# Patient Record
Sex: Female | Born: 1960 | Race: White | Hispanic: No | Marital: Married | Smoking: Never smoker
Health system: Southern US, Community
[De-identification: ages and names within clinical notes are randomized; demographics above are authoritative.]

## PROBLEM LIST (undated history)

## (undated) DIAGNOSIS — B9681 Helicobacter pylori [H. pylori] as the cause of diseases classified elsewhere: Secondary | ICD-10-CM

## (undated) DIAGNOSIS — K219 Gastro-esophageal reflux disease without esophagitis: Secondary | ICD-10-CM

## (undated) DIAGNOSIS — D126 Benign neoplasm of colon, unspecified: Secondary | ICD-10-CM

## (undated) DIAGNOSIS — G709 Myoneural disorder, unspecified: Secondary | ICD-10-CM

## (undated) DIAGNOSIS — D869 Sarcoidosis, unspecified: Secondary | ICD-10-CM

## (undated) DIAGNOSIS — K2970 Gastritis, unspecified, without bleeding: Secondary | ICD-10-CM

## (undated) HISTORY — DX: Gastro-esophageal reflux disease without esophagitis: K21.9

## (undated) HISTORY — PX: NECK SURGERY: SHX720

## (undated) HISTORY — DX: Myoneural disorder, unspecified: G70.9

## (undated) HISTORY — DX: Sarcoidosis, unspecified: D86.9

## (undated) HISTORY — PX: TUBAL LIGATION: SHX77

## (undated) HISTORY — DX: Benign neoplasm of colon, unspecified: D12.6

## (undated) HISTORY — DX: Helicobacter pylori (H. pylori) as the cause of diseases classified elsewhere: B96.81

## (undated) HISTORY — PX: APPENDECTOMY: SHX54

## (undated) HISTORY — DX: Gastritis, unspecified, without bleeding: K29.70

---

## 2012-03-31 ENCOUNTER — Other Ambulatory Visit (HOSPITAL_COMMUNITY): Payer: Self-pay | Admitting: Pulmonary Disease

## 2012-03-31 DIAGNOSIS — D869 Sarcoidosis, unspecified: Secondary | ICD-10-CM

## 2012-04-11 ENCOUNTER — Ambulatory Visit (HOSPITAL_COMMUNITY)
Admission: RE | Admit: 2012-04-11 | Discharge: 2012-04-11 | Disposition: A | Discharge status: Home / Self Care | Payer: PRIVATE HEALTH INSURANCE | Source: Ambulatory Visit | Attending: Pulmonary Disease | Admitting: Pulmonary Disease

## 2012-04-11 DIAGNOSIS — D869 Sarcoidosis, unspecified: Secondary | ICD-10-CM | POA: Insufficient documentation

## 2012-04-11 DIAGNOSIS — R0602 Shortness of breath: Secondary | ICD-10-CM | POA: Insufficient documentation

## 2012-04-12 NOTE — Procedures (Signed)
NAME:  Vigeant, [facility]           ACCOUNT NO.:  [id number]  MEDICAL RECORD NO.:  [id number]  LOCATION:                                 FACILITY:  PHYSICIAN:  Edward L. Juanetta Gosling, M.D.DATE OF BIRTH:  1960-02-05  DATE OF PROCEDURE:  04/11/2012 DATE OF DISCHARGE:                           PULMONARY FUNCTION TEST   REASON FOR PULMONARY FUNCTION TESTING:  Sarcoidosis.  1. Spirometry is normal. 2. Lung volumes are normal. 3. DLCO is mildly reduced. 4. Airway resistance is minimally elevated. 5. This study is consistent with a clinical diagnosis of sarcoidosis.     Edward L. Juanetta Gosling, M.D.     ELH/MEDQ  D:  04/11/2012  T:  04/12/2012  Job:  [id number]

## 2012-04-26 LAB — PULMONARY FUNCTION TEST

## 2012-05-30 ENCOUNTER — Ambulatory Visit (HOSPITAL_COMMUNITY)
Admission: RE | Admit: 2012-05-30 | Discharge: 2012-05-30 | Disposition: A | Discharge status: Home / Self Care | Payer: PRIVATE HEALTH INSURANCE | Source: Ambulatory Visit | Attending: Pulmonary Disease | Admitting: Pulmonary Disease

## 2012-05-30 DIAGNOSIS — IMO0001 Reserved for inherently not codable concepts without codable children: Secondary | ICD-10-CM | POA: Insufficient documentation

## 2012-05-30 DIAGNOSIS — R0602 Shortness of breath: Secondary | ICD-10-CM | POA: Insufficient documentation

## 2012-05-30 DIAGNOSIS — R5383 Other fatigue: Secondary | ICD-10-CM | POA: Insufficient documentation

## 2012-05-30 DIAGNOSIS — D869 Sarcoidosis, unspecified: Secondary | ICD-10-CM | POA: Insufficient documentation

## 2012-05-30 DIAGNOSIS — R5381 Other malaise: Secondary | ICD-10-CM | POA: Insufficient documentation

## 2012-06-01 ENCOUNTER — Other Ambulatory Visit (HOSPITAL_COMMUNITY): Payer: Self-pay | Admitting: Pulmonary Disease

## 2012-06-01 DIAGNOSIS — R1011 Right upper quadrant pain: Secondary | ICD-10-CM

## 2012-06-05 ENCOUNTER — Ambulatory Visit (HOSPITAL_COMMUNITY)
Admission: RE | Admit: 2012-06-05 | Discharge: 2012-06-05 | Disposition: A | Discharge status: Home / Self Care | Source: Ambulatory Visit | Attending: Pulmonary Disease | Admitting: Pulmonary Disease

## 2012-06-05 DIAGNOSIS — R1011 Right upper quadrant pain: Secondary | ICD-10-CM | POA: Insufficient documentation

## 2012-06-05 DIAGNOSIS — R935 Abnormal findings on diagnostic imaging of other abdominal regions, including retroperitoneum: Secondary | ICD-10-CM | POA: Insufficient documentation

## 2012-06-09 ENCOUNTER — Other Ambulatory Visit (HOSPITAL_COMMUNITY): Payer: Self-pay | Admitting: Pulmonary Disease

## 2012-06-09 DIAGNOSIS — R109 Unspecified abdominal pain: Secondary | ICD-10-CM

## 2012-06-13 ENCOUNTER — Ambulatory Visit (HOSPITAL_COMMUNITY)
Admission: RE | Admit: 2012-06-13 | Discharge: 2012-06-13 | Disposition: A | Discharge status: Home / Self Care | Source: Ambulatory Visit | Attending: Pulmonary Disease | Admitting: Pulmonary Disease

## 2012-06-13 ENCOUNTER — Encounter (HOSPITAL_COMMUNITY): Payer: Self-pay

## 2012-06-13 DIAGNOSIS — R932 Abnormal findings on diagnostic imaging of liver and biliary tract: Secondary | ICD-10-CM | POA: Insufficient documentation

## 2012-06-13 DIAGNOSIS — R109 Unspecified abdominal pain: Secondary | ICD-10-CM

## 2012-06-13 DIAGNOSIS — R1011 Right upper quadrant pain: Secondary | ICD-10-CM | POA: Insufficient documentation

## 2012-06-13 MED ORDER — TECHNETIUM TC 99M MEBROFENIN IV KIT
5.0000 | PACK | Freq: Once | INTRAVENOUS | Status: AC | PRN
  Administered 2012-06-13: 5 via INTRAVENOUS

## 2012-06-13 MED ORDER — SINCALIDE 5 MCG IJ SOLR
0.0200 ug/kg | Freq: Once | INTRAMUSCULAR | Status: AC
  Administered 2012-06-13: 1.25 ug via INTRAVENOUS

## 2012-11-07 DIAGNOSIS — Z8739 Personal history of other diseases of the musculoskeletal system and connective tissue: Secondary | ICD-10-CM | POA: Insufficient documentation

## 2012-11-07 DIAGNOSIS — R112 Nausea with vomiting, unspecified: Secondary | ICD-10-CM

## 2012-11-07 DIAGNOSIS — Z9889 Other specified postprocedural states: Secondary | ICD-10-CM

## 2012-11-07 DIAGNOSIS — K828 Other specified diseases of gallbladder: Secondary | ICD-10-CM | POA: Insufficient documentation

## 2012-11-07 HISTORY — DX: Other specified postprocedural states: Z98.890

## 2012-11-07 HISTORY — DX: Nausea with vomiting, unspecified: R11.2

## 2012-11-27 ENCOUNTER — Other Ambulatory Visit (HOSPITAL_COMMUNITY): Payer: Self-pay

## 2012-11-27 DIAGNOSIS — D869 Sarcoidosis, unspecified: Secondary | ICD-10-CM

## 2012-12-02 HISTORY — PX: CHOLECYSTECTOMY: SHX55

## 2012-12-04 ENCOUNTER — Ambulatory Visit (HOSPITAL_COMMUNITY)
Admission: RE | Admit: 2012-12-04 | Discharge: 2012-12-04 | Disposition: A | Discharge status: Home / Self Care | Source: Ambulatory Visit | Attending: Pulmonary Disease | Admitting: Pulmonary Disease

## 2012-12-04 ENCOUNTER — Other Ambulatory Visit (HOSPITAL_COMMUNITY): Payer: Self-pay | Admitting: Pulmonary Disease

## 2012-12-04 DIAGNOSIS — D869 Sarcoidosis, unspecified: Secondary | ICD-10-CM | POA: Insufficient documentation

## 2012-12-04 MED ORDER — ALBUTEROL SULFATE (5 MG/ML) 0.5% IN NEBU
2.5000 mg | INHALATION_SOLUTION | Freq: Once | RESPIRATORY_TRACT | Status: AC
  Administered 2012-12-04: 2.5 mg via RESPIRATORY_TRACT

## 2012-12-07 NOTE — Procedures (Signed)
NAME:  [place], [facility]           ACCOUNT NO.:  [id number]  MEDICAL RECORD NO.:  [id number]  LOCATION:                                 FACILITY:  PHYSICIAN:  Harsh Trulock L. Juanetta Gosling, M.D.DATE OF BIRTH:  01-26-1961  DATE OF PROCEDURE:  12/05/2012 DATE OF DISCHARGE:                           PULMONARY FUNCTION TEST   Reason for pulmonary function testing is sarcoidosis. 1. Spirometry shows no ventilatory defect and no evidence of airflow     obstruction. 2. Lung volumes are normal. 3. DLCO is mildly reduced. 4. Airway resistance is normal. 5. There is no significant bronchodilator improvement. 6. This study is consistent with the clinical diagnosis of     sarcoidosis.     Tinleigh Whitmire L. Juanetta Gosling, M.D.     ELH/MEDQ  D:  12/05/2012  T:  12/06/2012  Job:  [id number]

## 2012-12-12 LAB — PULMONARY FUNCTION TEST

## 2013-02-21 DIAGNOSIS — L730 Acne keloid: Secondary | ICD-10-CM | POA: Insufficient documentation

## 2013-07-19 ENCOUNTER — Encounter: Payer: Self-pay | Admitting: *Deleted

## 2013-08-01 DIAGNOSIS — D126 Benign neoplasm of colon, unspecified: Secondary | ICD-10-CM

## 2013-08-01 DIAGNOSIS — B9681 Helicobacter pylori [H. pylori] as the cause of diseases classified elsewhere: Secondary | ICD-10-CM

## 2013-08-01 HISTORY — DX: Benign neoplasm of colon, unspecified: D12.6

## 2013-08-01 HISTORY — DX: Helicobacter pylori (H. pylori) as the cause of diseases classified elsewhere: B96.81

## 2013-08-23 ENCOUNTER — Encounter (INDEPENDENT_AMBULATORY_CARE_PROVIDER_SITE_OTHER): Payer: Self-pay

## 2013-08-23 ENCOUNTER — Other Ambulatory Visit: Payer: Self-pay | Admitting: Gastroenterology

## 2013-08-23 ENCOUNTER — Encounter: Payer: Self-pay | Admitting: Gastroenterology

## 2013-08-23 ENCOUNTER — Ambulatory Visit (INDEPENDENT_AMBULATORY_CARE_PROVIDER_SITE_OTHER): Admitting: Gastroenterology

## 2013-08-23 VITALS — BP 115/73 | HR 76 | Temp 97.4°F | Ht 61.0 in | Wt 148.8 lb

## 2013-08-23 DIAGNOSIS — K5900 Constipation, unspecified: Secondary | ICD-10-CM

## 2013-08-23 DIAGNOSIS — R1013 Epigastric pain: Secondary | ICD-10-CM

## 2013-08-23 DIAGNOSIS — K3189 Other diseases of stomach and duodenum: Secondary | ICD-10-CM

## 2013-08-23 DIAGNOSIS — K5901 Slow transit constipation: Secondary | ICD-10-CM | POA: Insufficient documentation

## 2013-08-23 HISTORY — DX: Slow transit constipation: K59.01

## 2013-08-23 MED ORDER — OMEPRAZOLE 20 MG PO CPDR: 20.0000 mg | DELAYED_RELEASE_CAPSULE | Freq: Every day | ORAL | Status: DC

## 2013-08-23 MED ORDER — PEG 3350-KCL-NA BICARB-NACL 420 G PO SOLR: 4000.0000 mL | ORAL | Status: DC

## 2013-08-23 MED ORDER — LINACLOTIDE 145 MCG PO CAPS: 145.0000 ug | ORAL_CAPSULE | Freq: Every day | ORAL | Status: DC

## 2013-08-23 NOTE — Progress Notes (Signed)
Referring Provider: Alonza Bogus, MD Primary Care Physician:  Alonza Bogus, MD Primary Gastroenterologist:  Dr. Oneida Alar   Chief Complaint  Patient presents with  . Abdominal Pain  . Bloated    HPI:   Terri Newman presents today at the request of Dr. Luan Pulling secondary to abdominal pain and bloating. Last colonoscopy age 53. Cholecystectomy last Nov 2014 at [facility]. Notes upper abdominal discomfort, bloating. Burps a lot, stays constipated. Hiccups after eating. Epigastric burning, sometimes stabbing. Sometimes related to eating, sometimes not. Associated nausea. Occasional reflux. Takes Tums occasionally. No dysphagia. BM usually once a day, sometimes every other day. Hard and soft stool. Straining sometimes. Occasional low-volume hematochezia with straining. No melena. No weight loss, lack of appetite. No NSAIDs or aspirin powders. No prior EGD.   Past Medical History  Diagnosis Date  . Sarcoidosis     Past Surgical History  Procedure Laterality Date  . Tubal ligation    . Appendectomy    . Cholecystectomy  Nov 2014  . Neck surgery      Current Outpatient Prescriptions  Medication Sig Dispense Refill  . acetaminophen (TYLENOL) 500 MG tablet Take 500 mg by mouth every 6 (six) hours as needed.       No current facility-administered medications for this visit.    Allergies as of 08/23/2013  . (No Known Allergies)    Family History  Problem Relation Age of Onset  . Colon cancer Neg Hx     History   Social History  . Marital Status: Married    Spouse Name: N/A    Number of Children: N/A  . Years of Education: N/A   Occupational History  . caregiver    Social History Main Topics  . Smoking status: Never Smoker   . Smokeless tobacco: Not on file  . Alcohol Use: No  . Drug Use: No  . Sexual Activity: Not on file   Other Topics Concern  . Not on file   Social History Narrative  . No narrative on file    Review of Systems: Gen: Denies any  fever, chills, loss of appetite, fatigue, weight loss. CV: Denies chest pain, heart palpitations, syncope, peripheral edema. Resp: Denies shortness of breath with rest, cough, wheezing GI: see HPI  GU : Denies urinary burning, urinary frequency, urinary incontinence.  MS: back pain Derm: Denies rash, itching, dry skin Psych: Denies depression, anxiety, confusion or memory loss  Heme: Denies bruising, bleeding, and enlarged lymph nodes.  Physical Exam: BP 115/73  Pulse 76  Temp(Src) 97.4 F (36.3 C) (Oral)  Ht 5\' 1"  (1.549 m)  Wt 148 lb 12.8 oz (67.495 kg)  BMI 28.13 kg/m2 General:   Alert and oriented. Well-developed, well-nourished, pleasant and cooperative. Head:  Normocephalic and atraumatic. Eyes:  Conjunctiva pink, sclera clear, no icterus.   Conjunctiva pink. Ears:  Normal auditory acuity. Nose:  No deformity, discharge,  or lesions. Mouth:  No deformity or lesions, mucosa pink and moist.  Lungs:  Clear to auscultation bilaterally, without wheezing, rales, or rhonchi.  Heart:  S1, S2 present without murmurs noted.  Abdomen:  +BS, soft, mild discomfort with palpation upper abdomen and non-distended. Without mass or HSM. No rebound or guarding. No hernias noted. Rectal:  Deferred  Msk:  Symmetrical without gross deformities. Normal posture. Extremities:  Without clubbing or edema. Neurologic:  Alert and  oriented x4;  grossly normal neurologically. Skin:  Intact, warm and dry without significant lesions or rashes Psych:  Alert  and cooperative. Normal mood and affect.

## 2013-08-23 NOTE — Assessment & Plan Note (Addendum)
53 year old female with constipation, bloating, low-volume hematochezia likely benign in setting of straining. Last colonoscopy age 60. Needs routine screening at this time. Likely bloating and possibly even upper abdominal discomfort stemming from constipation. See dyspepsia.  Proceed with colonoscopy with Dr. Oneida Alar in the near future. The risks, benefits, and alternatives have been discussed in detail with the patient. They state understanding and desire to proceed.  Linzess 145 mcg daily Probiotic daily

## 2013-08-23 NOTE — Assessment & Plan Note (Signed)
Epigastric burning, nausea, and belching. No PPI. No NSAIDs or aspirin powders; she is without signs of melena or other concerning features. Start on Prilosec once daily. If no improvement with Prilosec and more aggressive constipation regimen, proceed with EGD at time of colonoscopy with Dr. Oneida Alar. Risks and benefits discussed in detail with patient and husband.

## 2013-08-23 NOTE — Patient Instructions (Signed)
We have scheduled you for a colonoscopy and possible upper endoscopy with Dr. Oneida Alar in the near future.  Start taking Prilosec one capsule each morning, 30 minutes before breakfast. This has been sent to your pharmacy.  For constipation: start Linzess 1 capsule each morning, 30 minutes before breakfast. A month supply voucher has been provided with refills at the pharmacy.  Start taking a probiotic daily such as digestive advantage, [employer]'s [employer] Health, [employer] brand, Align, or restora.

## 2013-08-24 ENCOUNTER — Encounter (HOSPITAL_COMMUNITY): Payer: Self-pay | Admitting: Pharmacy Technician

## 2013-08-24 ENCOUNTER — Ambulatory Visit: Admitting: Gastroenterology

## 2013-08-28 NOTE — Progress Notes (Signed)
cc'd to pcp 

## 2013-08-29 ENCOUNTER — Encounter (HOSPITAL_COMMUNITY)
Admission: RE | Disposition: A | Discharge status: Home / Self Care | Payer: Self-pay | Source: Ambulatory Visit | Attending: Gastroenterology

## 2013-08-29 ENCOUNTER — Ambulatory Visit (HOSPITAL_COMMUNITY)
Admission: RE | Admit: 2013-08-29 | Discharge: 2013-08-29 | Disposition: A | Discharge status: Home / Self Care | Payer: TRICARE For Life (TFL) | Source: Ambulatory Visit | Attending: Gastroenterology | Admitting: Gastroenterology

## 2013-08-29 ENCOUNTER — Encounter (HOSPITAL_COMMUNITY): Payer: Self-pay | Admitting: *Deleted

## 2013-08-29 DIAGNOSIS — K2940 Chronic atrophic gastritis without bleeding: Secondary | ICD-10-CM | POA: Insufficient documentation

## 2013-08-29 DIAGNOSIS — K620 Anal polyp: Secondary | ICD-10-CM | POA: Insufficient documentation

## 2013-08-29 DIAGNOSIS — K5900 Constipation, unspecified: Secondary | ICD-10-CM

## 2013-08-29 DIAGNOSIS — Z1211 Encounter for screening for malignant neoplasm of colon: Secondary | ICD-10-CM | POA: Insufficient documentation

## 2013-08-29 DIAGNOSIS — K3189 Other diseases of stomach and duodenum: Secondary | ICD-10-CM | POA: Insufficient documentation

## 2013-08-29 DIAGNOSIS — Q438 Other specified congenital malformations of intestine: Secondary | ICD-10-CM | POA: Insufficient documentation

## 2013-08-29 DIAGNOSIS — Z79899 Other long term (current) drug therapy: Secondary | ICD-10-CM | POA: Insufficient documentation

## 2013-08-29 DIAGNOSIS — A048 Other specified bacterial intestinal infections: Secondary | ICD-10-CM | POA: Insufficient documentation

## 2013-08-29 DIAGNOSIS — D126 Benign neoplasm of colon, unspecified: Secondary | ICD-10-CM

## 2013-08-29 DIAGNOSIS — R1013 Epigastric pain: Secondary | ICD-10-CM | POA: Insufficient documentation

## 2013-08-29 DIAGNOSIS — K648 Other hemorrhoids: Secondary | ICD-10-CM | POA: Insufficient documentation

## 2013-08-29 DIAGNOSIS — K621 Rectal polyp: Secondary | ICD-10-CM

## 2013-08-29 HISTORY — PX: COLONOSCOPY: SHX5424

## 2013-08-29 HISTORY — PX: ESOPHAGOGASTRODUODENOSCOPY: SHX5428

## 2013-08-29 SURGERY — COLONOSCOPY
Anesthesia: Moderate Sedation

## 2013-08-29 MED ORDER — LIDOCAINE VISCOUS 2 % MT SOLN
OROMUCOSAL | Status: AC
  Filled 2013-08-29: qty 15

## 2013-08-29 MED ORDER — LIDOCAINE VISCOUS 2 % MT SOLN
OROMUCOSAL | Status: DC | PRN
  Administered 2013-08-29: 1 via OROMUCOSAL

## 2013-08-29 MED ORDER — MEPERIDINE HCL 100 MG/ML IJ SOLN
INTRAMUSCULAR | Status: DC | PRN
  Administered 2013-08-29 (×3): 25 mg via INTRAVENOUS

## 2013-08-29 MED ORDER — MIDAZOLAM HCL 5 MG/5ML IJ SOLN
INTRAMUSCULAR | Status: AC
  Filled 2013-08-29: qty 10

## 2013-08-29 MED ORDER — PROMETHAZINE HCL 25 MG/ML IJ SOLN
INTRAMUSCULAR | Status: DC | PRN
  Administered 2013-08-29: 6.25 mg via INTRAVENOUS

## 2013-08-29 MED ORDER — MEPERIDINE HCL 100 MG/ML IJ SOLN
INTRAMUSCULAR | Status: AC
  Filled 2013-08-29: qty 2

## 2013-08-29 MED ORDER — SODIUM CHLORIDE 0.9 % IV SOLN
INTRAVENOUS | Status: DC
Start: 2013-08-29 — End: 2013-08-29
  Administered 2013-08-29: 10:00:00 via INTRAVENOUS

## 2013-08-29 MED ORDER — MIDAZOLAM HCL 5 MG/5ML IJ SOLN
INTRAMUSCULAR | Status: DC | PRN
  Administered 2013-08-29 (×2): 1 mg via INTRAVENOUS
  Administered 2013-08-29: 2 mg via INTRAVENOUS
  Administered 2013-08-29: 1 mg via INTRAVENOUS

## 2013-08-29 MED ORDER — PROMETHAZINE HCL 25 MG/ML IJ SOLN
INTRAMUSCULAR | Status: DC
Start: 2013-08-29 — End: 2013-08-29
  Filled 2013-08-29: qty 1

## 2013-08-29 MED ORDER — STERILE WATER FOR IRRIGATION IR SOLN
Status: DC | PRN
  Administered 2013-08-29: 10:00:00

## 2013-08-29 MED ORDER — SODIUM CHLORIDE 0.9 % IJ SOLN
INTRAMUSCULAR | Status: AC
  Filled 2013-08-29: qty 10

## 2013-08-29 NOTE — Discharge Instructions (Signed)
You have internal hemorrhoids, and HAD 4 polyps removed. You have mild gastritis.I biopsied your stomach.   DRINK WATER TO KEEP YOUR URINE LIGHT YELLOW.  CONTINUE OMEPRAZOLE & LINZESS.  AVOID ITEMS THAT TRIGGER GASTRITIS. SEE INFO BELOW.  FOLLOW A HIGH FIBER/LOW FAT DIET. AVOID ITEMS THAT CAUSE BLOATING. SEE INFO BELOW.  Next colonoscopy in 5-10 years.    ENDOSCOPY Care After Read the instructions outlined below and refer to this sheet in the next week. These discharge instructions provide you with general information on caring for yourself after you leave the hospital. While your treatment has been planned according to the most current medical practices available, unavoidable complications occasionally occur. If you have any problems or questions after discharge, call DR. Betzabe Bevans, [phone].  ACTIVITY  You may resume your regular activity, but move at a slower pace for the next 24 hours.   Take frequent rest periods for the next 24 hours.   Walking will help get rid of the air and reduce the bloated feeling in your belly (abdomen).   No driving for 24 hours (because of the medicine (anesthesia) used during the test).   You may shower.   Do not sign any important legal documents or operate any machinery for 24 hours (because of the anesthesia used during the test).    NUTRITION  Drink plenty of fluids.   You may resume your normal diet as instructed by your doctor.   Begin with a light meal and progress to your normal diet. Heavy or fried foods are harder to digest and may make you feel sick to your stomach (nauseated).   Avoid alcoholic beverages for 24 hours or as instructed.    MEDICATIONS  You may resume your normal medications.   WHAT YOU CAN EXPECT TODAY  Some feelings of bloating in the abdomen.   Passage of more gas than usual.   Spotting of blood in your stool or on the toilet paper  .  IF YOU HAD POLYPS REMOVED DURING THE ENDOSCOPY:  Eat a soft  diet IF YOU HAVE NAUSEA, BLOATING, ABDOMINAL PAIN, OR VOMITING.    FINDING OUT THE RESULTS OF YOUR TEST Not all test results are available during your visit. DR. Oneida Alar WILL CALL YOU WITHIN 7 DAYS OF YOUR PROCEDUE WITH YOUR RESULTS. Do not assume everything is normal if you have not heard from DR. Zacory Fiola IN ONE WEEK, CALL HER OFFICE AT [phone].  SEEK IMMEDIATE MEDICAL ATTENTION AND CALL THE OFFICE: [phone] IF:  You have more than a spotting of blood in your stool.   Your belly is swollen (abdominal distention).   You are nauseated or vomiting.   You have a temperature over 101F.   You have abdominal pain or discomfort that is severe or gets worse throughout the day.  Polyps, Colon  A polyp is extra tissue that grows inside your body. Colon polyps grow in the large intestine. The large intestine, also called the colon, is part of your digestive system. It is a long, hollow tube at the end of your digestive tract where your body makes and stores stool. Most polyps are not dangerous. They are benign. This means they are not cancerous. But over time, some types of polyps can turn into cancer. Polyps that are smaller than a pea are usually not harmful. But larger polyps could someday become or may already be cancerous. To be safe, doctors remove all polyps and test them.   WHO GETS POLYPS? Anyone can get polyps, but  certain people are more likely than others. You may have a greater chance of getting polyps if:  You are over 50.   You have had polyps before.   Someone in your family has had polyps.   Someone in your family has had cancer of the large intestine.   Find out if someone in your family has had polyps. You may also be more likely to get polyps if you:   Eat a lot of fatty foods   Smoke   Drink alcohol   Do not exercise  Eat too much   PREVENTION There is not one sure way to prevent polyps. You might be able to lower your risk of getting them if you:  Eat  more fruits and vegetables and less fatty food.   Do not smoke.   Avoid alcohol.   Exercise every day.   Lose weight if you are overweight.   Eating more calcium and folate can also lower your risk of getting polyps. Some foods that are rich in calcium are milk, cheese, and broccoli. Some foods that are rich in folate are chickpeas, kidney beans, and spinach.    Gastritis  Gastritis is an inflammation (the body's way of reacting to injury and/or infection) of the stomach. It is often caused by viral or bacterial (germ) infections. It can also be caused BY ASPIRIN, BC/GOODY POWDER'S, (IBUPROFEN) MOTRIN, OR ALEVE (NAPROXEN), chemicals (including alcohol), SPICY FOODS, and medications. This illness may be associated with generalized malaise (feeling tired, not well), UPPER ABDOMINAL STOMACH cramps, and fever. One common bacterial cause of gastritis is an organism known as H. Pylori. This can be treated with antibiotics.    High-Fiber Diet A high-fiber diet changes your normal diet to include more whole grains, legumes, fruits, and vegetables. Changes in the diet involve replacing refined carbohydrates with unrefined foods. The calorie level of the diet is essentially unchanged. The Dietary Reference Intake (recommended amount) for adult males is 38 grams per day. For adult females, it is 25 grams per day. Pregnant and lactating women should consume 28 grams of fiber per day. Fiber is the intact part of a plant that is not broken down during digestion. Functional fiber is fiber that has been isolated from the plant to provide a beneficial effect in the body. PURPOSE  Increase stool bulk.   Ease and regulate bowel movements.   Lower cholesterol.  INDICATIONS THAT YOU NEED MORE FIBER  Constipation and hemorrhoids.   Uncomplicated diverticulosis (intestine condition) and irritable bowel syndrome.   Weight management.   As a protective measure against hardening of the arteries  (atherosclerosis), diabetes, and cancer.   GUIDELINES FOR INCREASING FIBER IN THE DIET  Start adding fiber to the diet slowly. A gradual increase of about 5 more grams (2 slices of whole-wheat bread, 2 servings of most fruits or vegetables, or 1 bowl of high-fiber cereal) per day is best. Too rapid an increase in fiber may result in constipation, flatulence, and bloating.   Drink enough water and fluids to keep your urine clear or pale yellow. Water, juice, or caffeine-free drinks are recommended. Not drinking enough fluid may cause constipation.   Eat a variety of high-fiber foods rather than one type of fiber.   Try to increase your intake of fiber through using high-fiber foods rather than fiber pills or supplements that contain small amounts of fiber.   The goal is to change the types of food eaten. Do not supplement your present diet with high-fiber  foods, but replace foods in your present diet.  INCLUDE A VARIETY OF FIBER SOURCES  Replace refined and processed grains with whole grains, canned fruits with fresh fruits, and incorporate other fiber sources. White rice, white breads, and most bakery goods contain little or no fiber.   Brown whole-grain rice, buckwheat oats, and many fruits and vegetables are all good sources of fiber. These include: broccoli, Brussels sprouts, cabbage, cauliflower, beets, sweet potatoes, white potatoes (skin on), carrots, tomatoes, eggplant, squash, berries, fresh fruits, and dried fruits.   Cereals appear to be the richest source of fiber. Cereal fiber is found in whole grains and bran. Bran is the fiber-rich outer coat of cereal grain, which is largely removed in refining. In whole-grain cereals, the bran remains. In breakfast cereals, the largest amount of fiber is found in those with "bran" in their names. The fiber content is sometimes indicated on the label.   You may need to include additional fruits and vegetables each day.   In baking, for 1 cup  white flour, you may use the following substitutions:   1 cup whole-wheat flour minus 2 tablespoons.   1/2 cup white flour plus 1/2 cup whole-wheat flour.   Low-Fat Diet BREADS, CEREALS, PASTA, RICE, DRIED PEAS, AND BEANS These products are high in carbohydrates and most are low in fat. Therefore, they can be increased in the diet as substitutes for fatty foods. They too, however, contain calories and should not be eaten in excess. Cereals can be eaten for snacks as well as for breakfast.  Include foods that contain fiber (fruits, vegetables, whole grains, and legumes). Research shows that fiber may lower blood cholesterol levels, especially the water-soluble fiber found in fruits, vegetables, oat products, and legumes. FRUITS AND VEGETABLES It is good to eat fruits and vegetables. Besides being sources of fiber, both are rich in vitamins and some minerals. They help you get the daily allowances of these nutrients. Fruits and vegetables can be used for snacks and desserts. MEATS Limit lean meat, chicken, [place], and fish to no more than 6 ounces per day. Beef, Pork, and Lamb Use lean cuts of beef, pork, and lamb. Lean cuts include:  Extra-lean ground beef.  Arm roast.  Sirloin tip.  Center-cut ham.  Round steak.  Loin chops.  Rump roast.  Tenderloin.  Trim all fat off the outside of meats before cooking. It is not necessary to severely decrease the intake of red meat, but lean choices should be made. Lean meat is rich in protein and contains a highly absorbable form of iron. Premenopausal women, in particular, should avoid reducing lean red meat because this could increase the risk for low red blood cells (iron-deficiency anemia). The organ meats, such as liver, sweetbreads, kidneys, and brain are very rich in cholesterol. They should be limited. Chicken and [place] These are good sources of protein. The fat of poultry can be reduced by removing the skin and underlying fat layers before  cooking. Chicken and [place] can be substituted for lean red meat in the diet. Poultry should not be fried or covered with high-fat sauces. Fish and Shellfish Fish is a good source of protein. Shellfish contain cholesterol, but they usually are low in saturated fatty acids. The preparation of fish is important. Like chicken and [place], they should not be fried or covered with high-fat sauces. EGGS Egg whites contain no fat or cholesterol. They can be eaten often. Try 1 to 2 egg whites instead of whole eggs in recipes or  use egg substitutes that do not contain yolk. MILK AND DAIRY PRODUCTS Use skim or 1% milk instead of 2% or whole milk. Decrease whole milk, natural, and processed cheeses. Use nonfat or low-fat (2%) cottage cheese or low-fat cheeses made from vegetable oils. Choose nonfat or low-fat (1 to 2%) yogurt. Experiment with evaporated skim milk in recipes that call for heavy cream. Substitute low-fat yogurt or low-fat cottage cheese for sour cream in dips and salad dressings. Have at least 2 servings of low-fat dairy products, such as 2 glasses of skim (or 1%) milk each day to help get your daily calcium intake.  FATS AND OILS Reduce the total intake of fats, especially saturated fat. Butterfat, lard, and beef fats are high in saturated fat and cholesterol. These should be avoided as much as possible. Vegetable fats do not contain cholesterol, but certain vegetable fats, such as coconut oil, palm oil, and palm kernel oil are very high in saturated fats. These should be limited. These fats are often used in bakery goods, processed foods, popcorn, oils, and nondairy creamers. Vegetable shortenings and some peanut butters contain hydrogenated oils, which are also saturated fats. Read the labels on these foods and check for saturated vegetable oils. Unsaturated vegetable oils and fats do not raise blood cholesterol. However, they should be limited because they are fats and are high in calories. Total  fat should still be limited to 30% of your daily caloric intake. Desirable liquid vegetable oils are corn oil, cottonseed oil, olive oil, canola oil, safflower oil, soybean oil, and sunflower oil. Peanut oil is not as good, but small amounts are acceptable. Buy a heart-healthy tub margarine that has no partially hydrogenated oils in the ingredients. Mayonnaise and salad dressings often are made from unsaturated fats, but they should also be limited because of their high calorie and fat content. Seeds, nuts, peanut butter, olives, and avocados are high in fat, but the fat is mainly the unsaturated type. These foods should be limited mainly to avoid excess calories and fat. OTHER EATING TIPS Snacks  Most sweets should be limited as snacks. They tend to be rich in calories and fats, and their caloric content outweighs their nutritional value. Some good choices in snacks are graham crackers, melba toast, soda crackers, bagels (no egg), English muffins, fruits, and vegetables. These snacks are preferable to snack crackers, [place] fries, and chips. Popcorn should be air-popped or cooked in small amounts of liquid vegetable oil. Desserts Eat fruit, low-fat yogurt, and fruit ices. AVOID pastries, cake, and cookies. Sherbet, angel food cake, gelatin dessert, frozen low-fat yogurt, or other frozen products that do not contain saturated fat (pure fruit juice bars, frozen ice pops) are also acceptable.  COOKING METHODS Choose those methods that use little or no fat. They include: Poaching.  Braising.  Steaming.  Grilling.  Baking.  Stir-frying.  Broiling.  Microwaving.  Foods can be cooked in a nonstick pan without added fat, or use a nonfat cooking spray in regular cookware. Limit fried foods and avoid frying in saturated fat. Add moisture to lean meats by using water, broth, cooking wines, and other nonfat or low-fat sauces along with the cooking methods mentioned above. Soups and stews should be chilled  after cooking. The fat that forms on top after a few hours in the refrigerator should be skimmed off. When preparing meals, avoid using excess salt. Salt can contribute to raising blood pressure in some people. EATING AWAY FROM HOME Order entres, potatoes, and vegetables without sauces  or butter. When meat exceeds the size of a deck of cards (3 to 4 ounces), the rest can be taken home for another meal. Choose vegetable or fruit salads and ask for low-calorie salad dressings to be served on the side. Use dressings sparingly. Limit high-fat toppings, such as bacon, crumbled eggs, cheese, sunflower seeds, and olives. Ask for heart-healthy tub margarine instead of butter.   Hemorrhoids Hemorrhoids are dilated (enlarged) veins around the rectum. Sometimes clots will form in the veins. This makes them swollen and painful. These are called thrombosed hemorrhoids. Causes of hemorrhoids include:  Constipation.   Straining to have a bowel movement.   HEAVY LIFTING HOME CARE INSTRUCTIONS  Eat a well balanced diet and drink 6 to 8 glasses of water every day to avoid constipation. You may also use a bulk laxative.   Avoid straining to have bowel movements.   Keep anal area dry and clean.   Do not use a donut shaped pillow or sit on the toilet for long periods. This increases blood pooling and pain.   Move your bowels when your body has the urge; this will require less straining and will decrease pain and pressure.

## 2013-08-29 NOTE — Progress Notes (Signed)
REVIEWED.  

## 2013-08-29 NOTE — H&P (Signed)
  Primary Care Physician:  Alonza Bogus, MD Primary Gastroenterologist:  Dr. Oneida Alar  Pre-Procedure History & Physical: HPI:  Terri Newman is a 53 y.o. female here for ABDOMINAL PAIN/screening  Past Medical History  Diagnosis Date  . Sarcoidosis     Past Surgical History  Procedure Laterality Date  . Tubal ligation    . Appendectomy    . Cholecystectomy  Nov 2014  . Neck surgery      Prior to Admission medications   Medication Sig Start Date End Date Taking? Authorizing Provider  acetaminophen (TYLENOL) 500 MG tablet Take 500 mg by mouth every 6 (six) hours as needed for mild pain.    Yes Historical Provider, MD  Linaclotide Rolan Lipa) 145 MCG CAPS capsule Take 1 capsule (145 mcg total) by mouth daily. 30 minutes before breakfast 08/23/13  Yes Orvil Feil, NP  omeprazole (PRILOSEC) 20 MG capsule Take 1 capsule (20 mg total) by mouth daily. 30 minutes before breakfast. 08/23/13  Yes Orvil Feil, NP  polyethylene glycol-electrolytes (TRILYTE) 420 G solution Take 4,000 mLs by mouth as directed. 08/23/13  Yes Danie Binder, MD  Probiotic Product (ALIGN) 4 MG CAPS Take 1 capsule by mouth daily.   Yes Historical Provider, MD    Allergies as of 08/23/2013  . (No Known Allergies)    Family History  Problem Relation Age of Onset  . Colon cancer Neg Hx     History   Social History  . Marital Status: Married    Spouse Name: N/A    Number of Children: N/A  . Years of Education: N/A   Occupational History  . caregiver    Social History Main Topics  . Smoking status: Never Smoker   . Smokeless tobacco: Not on file  . Alcohol Use: No  . Drug Use: No  . Sexual Activity: Not on file   Other Topics Concern  . Not on file   Social History Narrative  . No narrative on file    Review of Systems: See HPI, otherwise negative ROS   Physical Exam: BP 119/81  Pulse 77  Temp(Src) 97.7 F (36.5 C) (Oral)  Resp 20  Ht 5\' 1"  (1.549 m)  Wt 148 lb (67.132 kg)  BMI 27.98  kg/m2  SpO2 100% General:   Alert,  pleasant and cooperative in NAD Head:  Normocephalic and atraumatic. Neck:  Supple; Lungs:  Clear throughout to auscultation.    Heart:  Regular rate and rhythm. Abdomen:  Soft, nontender and nondistended. Normal bowel sounds, without guarding, and without rebound.   Neurologic:  Alert and  oriented x4;  grossly normal neurologically.  Impression/Plan:    SCREENING/dyspepsia  Plan:  1. TCS/EGD TODAY

## 2013-08-29 NOTE — Op Note (Addendum)
[facility] [address] [place], [address]   COLONOSCOPY PROCEDURE REPORT  PATIENT: Terri Newman, Terri Newman  MR#: [id number] BIRTHDATE: 07-30-1960 , 53  yrs. old GENDER: Female ENDOSCOPIST: Barney Drain, MD REFERRED QM:[id number] Luan Pulling, M.D. PROCEDURE DATE:  08/29/2013 PROCEDURE:   Colonoscopy with snare and cold biopsy polypectomy INDICATIONS:Average risk patient for colon cancer. MEDICATIONS: Demerol 50 mg IV and Versed 4 mg IV  DESCRIPTION OF PROCEDURE:    Physical exam was performed.  Informed consent was obtained from the patient after explaining the benefits, risks, and alternatives to procedure.  The patient was connected to monitor and placed in left lateral position. Continuous oxygen was provided by nasal cannula and IV medicine administered through an indwelling cannula.  After administration of sedation and rectal exam, the patients rectum was intubated and the EC-3890Li ([id number])  colonoscope was advanced under direct visualization to the ileum.  The scope was removed slowly by carefully examining the color, texture, anatomy, and integrity mucosa on the way out.  The patient was recovered in endoscopy and discharged home in satisfactory condition.    COLON FINDINGS: The mucosa appeared normal in the terminal ileum.  , Two sessile polyps measuring 6 mm in size were found in the sigmoid colon.  A polypectomy was performed using snare cautery.  , Two sessile polyps measuring 2-4 mm in size were found in the rectum. A polypectomy was performed with cold forceps.  , Moderate sized internal hemorrhoids were found.  , and The LEFT colon IS SLIGHTLY redundant.  Manual abdominal counter-pressure was used to reach the cecum.  PREP QUALITY: good.  CECAL W/D TIME: 18 minutes     COMPLICATIONS: None  ENDOSCOPIC IMPRESSION: 1.   Normal mucosa in the terminal ileum 2.   FOUR COLORECTAL polyps REMOVED 3.   Moderate sized internal hemorrhoids 4.   The LEFT colon  IS SLIGHTLY redundant  RECOMMENDATIONS: DRINK WATER TO KEEP YOUR URINE LIGHT YELLOW. CONTINUE OMEPRAZOLE & LINZESS. AVOID ITEMS THAT TRIGGER GASTRITIS.  FOLLOW A HIGH FIBER/LOW FAT DIET.  AVOID ITEMS THAT CAUSE BLOATING.  Next colonoscopy in 5-10 years. CONSIDER OVERTUBE.       _______________________________ Lorrin MaisBarney Drain, MD 08/29/2013 3:19 PM

## 2013-08-29 NOTE — Op Note (Signed)
[facility] [address] [place], [address]   ENDOSCOPY PROCEDURE REPORT  PATIENT: Terri, Newman  MR#: [id number] BIRTHDATE: 1960/04/13 , 53  yrs. old GENDER: Female  ENDOSCOPIST: Barney Drain, MD REFERRED TO:[id number] Luan Pulling, M.D. PROCEDURE DATE: 08/29/2013 PROCEDURE:   EGD w/ biopsy  INDICATIONS:Epigastric pain.   Dyspepsia. MEDICATIONS: TCS + Demerol 25 mg IV, Versed 1mg  IV TOPICAL ANESTHETIC:   Viscous Xylocaine  DESCRIPTION OF PROCEDURE:     Physical exam was performed.  Informed consent was obtained from the patient after explaining the benefits, risks, and alternatives to the procedure.  The patient was connected to the monitor and placed in the left lateral position.  Continuous oxygen was provided by nasal cannula and IV medicine administered through an indwelling cannula.  After administration of sedation, the patients esophagus was intubated and the EC-3890Li ([id number]) and EG-[id number] ([id number])  endoscope was advanced under direct visualization to the second portion of the duodenum.  The scope was removed slowly by carefully examining the color, texture, anatomy, and integrity of the mucosa on the way out.  The patient was recovered in endoscopy and discharged home in satisfactory condition.    ESOPHAGUS: The mucosa of the esophagus appeared normal.   STOMACH: Non-erosive gastritis (inflammation) was found in the gastric antrum.  Multiple biopsies were performed using cold forceps. DUODENUM: The duodenal mucosa showed no abnormalities in the bulb and second portion of the duodenum. COMPLICATIONS:   None  ENDOSCOPIC IMPRESSION: 1.   EPIGASTRIC PAIN MOST LIKELY DUE TO CONSTIPATION AND GASTRITIS 2.   MILD Non-erosive gastritis  RECOMMENDATIONS: DRINK WATER TO KEEP YOUR URINE LIGHT YELLOW. CONTINUE OMEPRAZOLE & LINZESS. AVOID ITEMS THAT TRIGGER GASTRITIS.  FOLLOW A HIGH FIBER/LOW FAT DIET.  AVOID ITEMS THAT CAUSE BLOATING.  Next colonoscopy  in 5-10 years. CONSIDER OVERTUBE.   REPEAT EXAM: __________________________ Lorrin MaisBarney Drain, MD 08/29/2013 3:25 PM [Instructions Given]

## 2013-08-31 ENCOUNTER — Telehealth: Payer: Self-pay | Admitting: Gastroenterology

## 2013-08-31 ENCOUNTER — Encounter (HOSPITAL_COMMUNITY): Payer: Self-pay | Admitting: Gastroenterology

## 2013-08-31 NOTE — Telephone Encounter (Signed)
[employer] approved TCS+/-EGD pac# [id number]

## 2013-09-11 ENCOUNTER — Telehealth: Payer: Self-pay

## 2013-09-11 ENCOUNTER — Telehealth: Payer: Self-pay | Admitting: Gastroenterology

## 2013-09-11 NOTE — Telephone Encounter (Signed)
Routing to Dr. Fields.  

## 2013-09-11 NOTE — Telephone Encounter (Signed)
Pt would like results of colonoscopy.

## 2013-09-11 NOTE — Telephone Encounter (Signed)
Pt called to check on results from her colonoscopy. I told her results were not back yet, but the nurse would be getting back with her once they are available. [phone]

## 2013-09-12 ENCOUNTER — Encounter: Payer: Self-pay | Admitting: Gastroenterology

## 2013-09-12 MED ORDER — CLARITHROMYCIN 500 MG PO TABS: ORAL_TABLET | ORAL | Status: DC

## 2013-09-12 MED ORDER — AMOXICILLIN 500 MG PO TABS: ORAL_TABLET | ORAL | Status: DC

## 2013-09-12 NOTE — Telephone Encounter (Addendum)
PLEASE CALL PT. Her stomach Bx showed H. Pylori infection. She needs AMOXICILLIN 500 mg 2 po BID for 10 days and Biaxin 500 mg po bid for 10 days.She needs Omeprazole 20 mg BID for 10 days then 1 po DAILY for 3 mos. Med side effects include NVD, abd pain, and metallic taste. She had simple adenomas removed from her colon.   DRINK WATER TO KEEP YOUR URINE LIGHT YELLOW.  CONTINUE LINZESS.  FOLLOW A HIGH FIBER/LOW FAT DIET. AVOID ITEMS THAT CAUSE BLOATING.   OPV E30 NOV 2015 CONSTIPATION, HP YLORI GASTRITIS.  Next colonoscopy in 5 years.

## 2013-09-12 NOTE — Addendum Note (Signed)
Addended by: Danie Binder on: 09/12/2013 09:25 PM   Modules accepted: Orders

## 2013-09-13 NOTE — Telephone Encounter (Signed)
Reminders in epic °

## 2013-09-13 NOTE — Telephone Encounter (Signed)
Pt informed of results and meds.

## 2013-09-21 ENCOUNTER — Telehealth: Payer: Self-pay

## 2013-09-21 NOTE — Telephone Encounter (Signed)
Pt called and said she has been having intermittent pain in her RUQ since before her office visit. It happens about once every couple of weeks and the pain is bad for awhile and she feels very bloated. She is taking PPI and Linzess and said they are working well. She is aware that Dr. Oneida Alar is at the hospital. If she has a bad episode she will go to the ED. Please advise!

## 2013-09-21 NOTE — Telephone Encounter (Signed)
PT was informed on 09/13/2013.

## 2013-10-09 ENCOUNTER — Telehealth: Payer: Self-pay | Admitting: Gastroenterology

## 2013-10-09 LAB — HM MAMMOGRAPHY

## 2013-10-09 NOTE — Telephone Encounter (Signed)
Pt called with a billing question about why her insurance didn't cover her procedure 100%. I told her that we don't have a billing office here and offered to give her the billing number. She continues to tell me that she shouldn't have a bill balance and was told by her insurance it was the way it was coded. She said that she wasn't being treated for gastritis, but she was at the age to have tcs done. I told her that my office manager wouldn't be back until Monday and I would let her be aware of the situation and advise her what to do. [phone]

## 2013-10-15 NOTE — Telephone Encounter (Signed)
I explained to the patient that she had a EGD done at the time of her tcs and that particular procedure is not a preventative procedure.  She voiced understanding and stated she will set up payments to pay her remaining balance.

## 2013-10-15 NOTE — Telephone Encounter (Signed)
Patient LMOM for someone to return her call. She had called last week regarding a billing question and I told her that our office manager wouldn't be back until 9/14 and I also gave her the billing office number.

## 2013-11-14 ENCOUNTER — Encounter: Payer: Self-pay | Admitting: Gastroenterology

## 2013-11-22 ENCOUNTER — Encounter: Payer: Self-pay | Admitting: Gastroenterology

## 2014-01-10 ENCOUNTER — Ambulatory Visit: Admitting: Gastroenterology

## 2014-01-15 ENCOUNTER — Other Ambulatory Visit (HOSPITAL_COMMUNITY): Payer: Self-pay | Admitting: Respiratory Therapy

## 2014-01-15 DIAGNOSIS — D869 Sarcoidosis, unspecified: Secondary | ICD-10-CM

## 2014-01-17 ENCOUNTER — Ambulatory Visit: Admitting: Gastroenterology

## 2014-01-21 ENCOUNTER — Ambulatory Visit (HOSPITAL_COMMUNITY)
Admission: RE | Admit: 2014-01-21 | Discharge: 2014-01-21 | Disposition: A | Discharge status: Home / Self Care | Source: Ambulatory Visit | Attending: Pulmonary Disease | Admitting: Pulmonary Disease

## 2014-01-21 ENCOUNTER — Other Ambulatory Visit (HOSPITAL_COMMUNITY): Payer: Self-pay | Admitting: Pulmonary Disease

## 2014-01-21 DIAGNOSIS — D869 Sarcoidosis, unspecified: Secondary | ICD-10-CM | POA: Insufficient documentation

## 2014-01-21 DIAGNOSIS — Z9049 Acquired absence of other specified parts of digestive tract: Secondary | ICD-10-CM | POA: Insufficient documentation

## 2014-01-21 MED ORDER — ALBUTEROL SULFATE (2.5 MG/3ML) 0.083% IN NEBU
2.5000 mg | INHALATION_SOLUTION | Freq: Once | RESPIRATORY_TRACT | Status: AC
  Administered 2014-01-21: 2.5 mg via RESPIRATORY_TRACT

## 2014-01-22 LAB — PULMONARY FUNCTION TEST
DL/VA % pred: 102 %
DL/VA: 4.5 ml/min/mmHg/L
DLCO COR % PRED: 82 %
DLCO cor: 16.62 ml/min/mmHg
DLCO unc % pred: 82 %
DLCO unc: 16.62 ml/min/mmHg
FEF 25-75 POST: 3.05 L/s
FEF 25-75 PRE: 2.86 L/s
FEF2575-%Change-Post: 6 %
FEF2575-%PRED-PRE: 114 %
FEF2575-%Pred-Post: 122 %
FEV1-%CHANGE-POST: 0 %
FEV1-%Pred-Post: 95 %
FEV1-%Pred-Pre: 95 %
FEV1-Post: 2.36 L
FEV1-Pre: 2.36 L
FEV1FVC-%Change-Post: 2 %
FEV1FVC-%PRED-PRE: 107 %
FEV6-%CHANGE-POST: -3 %
FEV6-%PRED-POST: 87 %
FEV6-%Pred-Pre: 90 %
FEV6-POST: 2.65 L
FEV6-Pre: 2.74 L
FEV6FVC-%PRED-PRE: 103 %
FEV6FVC-%Pred-Post: 103 %
FVC-%Change-Post: -3 %
FVC-%PRED-PRE: 87 %
FVC-%Pred-Post: 84 %
FVC-POST: 2.65 L
FVC-Pre: 2.74 L
POST FEV1/FVC RATIO: 89 %
POST FEV6/FVC RATIO: 100 %
Pre FEV1/FVC ratio: 86 %
Pre FEV6/FVC Ratio: 100 %
RV % pred: 92 %
RV: 1.56 L
TLC % pred: 92 %
TLC: 4.25 L

## 2014-02-14 ENCOUNTER — Encounter: Payer: Self-pay | Admitting: Gastroenterology

## 2014-02-14 ENCOUNTER — Ambulatory Visit (INDEPENDENT_AMBULATORY_CARE_PROVIDER_SITE_OTHER): Admitting: Gastroenterology

## 2014-02-14 VITALS — BP 115/67 | HR 78 | Temp 98.3°F | Ht 61.0 in | Wt 147.8 lb

## 2014-02-14 DIAGNOSIS — R1013 Epigastric pain: Secondary | ICD-10-CM

## 2014-02-14 DIAGNOSIS — R079 Chest pain, unspecified: Secondary | ICD-10-CM | POA: Insufficient documentation

## 2014-02-14 DIAGNOSIS — R071 Chest pain on breathing: Secondary | ICD-10-CM

## 2014-02-14 DIAGNOSIS — R0789 Other chest pain: Secondary | ICD-10-CM | POA: Insufficient documentation

## 2014-02-14 NOTE — Progress Notes (Signed)
ON RECALL LIST  °

## 2014-02-14 NOTE — Assessment & Plan Note (Signed)
   IBUPROFEN OVER THE COUNTER 1-2 THREE TIMES A DAY FOR 10 DAYS AND REPEAT FOR ANOTHER 10 DAYS IF YOU CHEST/R SIDE PAIN IS NOT BETTER. CALL IN ONE MONTH IF PAIN IS NOT RESOLVED, YOU WILL NEED A PHYSICAL THERAPY REFERRAL. USE OMEPRAZOLE DAILY WHEN TAKING IBUPROFEN. CONTINUE YOUR WEIGHT LOSS EFFORTS. FOLLOW A LOW FAT DIET. FOOD SHOULD BE BAKED, BOILED, OR BROILED. SEE INFO BELOW.  FOLLOW UP IN 4 MOS.

## 2014-02-14 NOTE — Assessment & Plan Note (Signed)
SX IMPROVED.  OMEPRAZOLE PRN LOW FAT DIET LOSE WEIGHT FOLLOW UP IN 4 MOS.

## 2014-02-14 NOTE — Patient Instructions (Addendum)
IBUPROFEN OVER THE COUNTER 1-2 THREE TIMES A DAY FOR 10 DAYS AND REPEAT FOR ANOTHER 10 DAYS IF YOU CHEST/R SIDE PAIN IS NOT BETTER. CALL IN ONE MONTH IF PAIN IS NOT RESOLVED, YOU WILL NEED A PHYSICAL THERAPY REFERRAL.  USE OMEPRAZOLE DAILY WHEN TAKING IBUPROFEN.  CONTINUE YOUR WEIGHT LOSS EFFORTS.  FOLLOW A LOW FAT DIET. FOOD SHOULD BE BAKED, BOILED, OR BROILED. SEE INFO BELOW.  FOLLOW UP IN 4 MOS.    Low-Fat Diet BREADS, CEREALS, PASTA, RICE, DRIED PEAS, AND BEANS These products are high in carbohydrates and most are low in fat. Therefore, they can be increased in the diet as substitutes for fatty foods. They too, however, contain calories and should not be eaten in excess. Cereals can be eaten for snacks as well as for breakfast.   FRUITS AND VEGETABLES It is good to eat fruits and vegetables. Besides being sources of fiber, both are rich in vitamins and some minerals. They help you get the daily allowances of these nutrients. Fruits and vegetables can be used for snacks and desserts.  MEATS Limit lean meat, chicken, [place], and fish to no more than 6 ounces per day. Beef, Pork, and Lamb Use lean cuts of beef, pork, and lamb. Lean cuts include:  Extra-lean ground beef.  Arm roast.  Sirloin tip.  Center-cut ham.  Round steak.  Loin chops.  Rump roast.  Tenderloin.  Trim all fat off the outside of meats before cooking. It is not necessary to severely decrease the intake of red meat, but lean choices should be made. Lean meat is rich in protein and contains a highly absorbable form of iron. Premenopausal women, in particular, should avoid reducing lean red meat because this could increase the risk for low red blood cells (iron-deficiency anemia).  Chicken and [place] These are good sources of protein. The fat of poultry can be reduced by removing the skin and underlying fat layers before cooking. Chicken and [place] can be substituted for lean red meat in the diet. Poultry should not be  fried or covered with high-fat sauces. Fish and Shellfish Fish is a good source of protein. Shellfish contain cholesterol, but they usually are low in saturated fatty acids. The preparation of fish is important. Like chicken and [place], they should not be fried or covered with high-fat sauces. EGGS Egg whites contain no fat or cholesterol. They can be eaten often. Try 1 to 2 egg whites instead of whole eggs in recipes or use egg substitutes that do not contain yolk. MILK AND DAIRY PRODUCTS Use skim or 1% milk instead of 2% or whole milk. Decrease whole milk, natural, and processed cheeses. Use nonfat or low-fat (2%) cottage cheese or low-fat cheeses made from vegetable oils. Choose nonfat or low-fat (1 to 2%) yogurt. Experiment with evaporated skim milk in recipes that call for heavy cream. Substitute low-fat yogurt or low-fat cottage cheese for sour cream in dips and salad dressings. Have at least 2 servings of low-fat dairy products, such as 2 glasses of skim (or 1%) milk each day to help get your daily calcium intake. FATS AND OILS Reduce the total intake of fats, especially saturated fat. Butterfat, lard, and beef fats are high in saturated fat and cholesterol. These should be avoided as much as possible. Vegetable fats do not contain cholesterol, but certain vegetable fats, such as coconut oil, palm oil, and palm kernel oil are very high in saturated fats. These should be limited. These fats are often used in bakery  goods, processed foods, popcorn, oils, and nondairy creamers. Vegetable shortenings and some peanut butters contain hydrogenated oils, which are also saturated fats. Read the labels on these foods and check for saturated vegetable oils. Unsaturated vegetable oils and fats do not raise blood cholesterol. However, they should be limited because they are fats and are high in calories. Total fat should still be limited to 30% of your daily caloric intake. Desirable liquid vegetable oils are  corn oil, cottonseed oil, olive oil, canola oil, safflower oil, soybean oil, and sunflower oil. Peanut oil is not as good, but small amounts are acceptable. Buy a heart-healthy tub margarine that has no partially hydrogenated oils in the ingredients. Mayonnaise and salad dressings often are made from unsaturated fats, but they should also be limited because of their high calorie and fat content. Seeds, nuts, peanut butter, olives, and avocados are high in fat, but the fat is mainly the unsaturated type. These foods should be limited mainly to avoid excess calories and fat. OTHER EATING TIPS Snacks  Most sweets should be limited as snacks. They tend to be rich in calories and fats, and their caloric content outweighs their nutritional value. Some good choices in snacks are graham crackers, melba toast, soda crackers, bagels (no egg), English muffins, fruits, and vegetables. These snacks are preferable to snack crackers, [place] fries, TORTILLA CHIPS, and POTATO chips. Popcorn should be air-popped or cooked in small amounts of liquid vegetable oil. Desserts Eat fruit, low-fat yogurt, and fruit ices instead of pastries, cake, and cookies. Sherbet, angel food cake, gelatin dessert, frozen low-fat yogurt, or other frozen products that do not contain saturated fat (pure fruit juice bars, frozen ice pops) are also acceptable.  COOKING METHODS Choose those methods that use little or no fat. They include: Poaching.  Braising.  Steaming.  Grilling.  Baking.  Stir-frying.  Broiling.  Microwaving.  Foods can be cooked in a nonstick pan without added fat, or use a nonfat cooking spray in regular cookware. Limit fried foods and avoid frying in saturated fat. Add moisture to lean meats by using water, broth, cooking wines, and other nonfat or low-fat sauces along with the cooking methods mentioned above. Soups and stews should be chilled after cooking. The fat that forms on top after a few hours in the  refrigerator should be skimmed off. When preparing meals, avoid using excess salt. Salt can contribute to raising blood pressure in some people.  EATING AWAY FROM HOME Order entres, potatoes, and vegetables without sauces or butter. When meat exceeds the size of a deck of cards (3 to 4 ounces), the rest can be taken home for another meal. Choose vegetable or fruit salads and ask for low-calorie salad dressings to be served on the side. Use dressings sparingly. Limit high-fat toppings, such as bacon, crumbled eggs, cheese, sunflower seeds, and olives. Ask for heart-healthy tub margarine instead of butter.

## 2014-02-14 NOTE — Progress Notes (Signed)
   Subjective:    Patient ID: Terri Newman, female    DOB: [birth year], 54 y.o.   MRN: [id number]  Alonza Bogus, MD  HPI EPIGASTRIC PAIN-DULL ACHE FOR A WHILE. NO  WEIGHT LOSS. NOTICES 5/7 DAYS A WEEK. WAS ON PREDNISONE. HARD TIME LOSING WEIGHT. FEELS BLOATED ALL THE TIME. WEIGHT SAME: JUL 2015. FOOD DOES NOT CHANGE IT. NO CHANGE IN BMs.APPETITE: NL. NO ETOH/ASA. MAY USE IBUPROFEN-1X/ WEEK FOR ACHES/PAIN. TRIGGERS: NOTHING. DOESN'T LAY ON STOMACH BECAUSE OF THE DULL ACHE. MAY LASTA HRS. DOESN'T KEEP HER AWAKE. IT'S VAGUE. MAY FEEL PAIN IN RUQ IF SHE MOVES REAL QUICK. BmS: diarrhea, daily on efdinir FOR EAR INFECTION. STOPPED LINZESS DUE TOO MANY BMs. RARE HEARTBURN.  PT DENIES FEVER, CHILLS, HEMATOCHEZIA, nausea, vomiting, melena, CHEST PAIN, SHORTNESS OF BREATH, problems swallowing, OR  heartburn or indigestion.   Past Medical History  Diagnosis Date  . Sarcoidosis   . Helicobacter pylori gastritis JUL 2015    ABO  BID FOR 10 DAYS  . Adenomatous polyp of colon JUL 2015    TWO, REDUNDNAT LEFT COLON   Past Surgical History  Procedure Laterality Date  . Tubal ligation    . Appendectomy    . Cholecystectomy  Nov 2014    [facility]  . Neck surgery    . Colonoscopy N/A 08/29/2013    Procedure: COLONOSCOPY;  Surgeon: Danie Binder, MD;  Location: [facility];  Service: Endoscopy;  Laterality: N/A;  10:15  . Esophagogastroduodenoscopy N/A 08/29/2013    Procedure: ESOPHAGOGASTRODUODENOSCOPY (EGD);  Surgeon: Danie Binder, MD;  Location: [facility];  Service: Endoscopy;  Laterality: N/A;   No Known Allergies  Current Outpatient Prescriptions  Medication Sig Dispense Refill  . acetaminophen (TYLENOL) 500 MG tablet Take 500 mg by mouth every 6 (six) hours as needed for mild pain.     Marland Kitchen CEFDINIR PO Take 300 mg by mouth 2 (two) times daily.    Marland Kitchen omeprazole (PRILOSEC) 20 MG capsule Take 1 capsule (20 mg total) by mouth daily. 30 minutes before breakfast. PRN   . zolpidem (AMBIEN) 5 MG  tablet Take 5 mg by mouth at bedtime as needed.    .      . Probiotic Product (ALIGN) 4 MG CAPS Take 1 capsule by mouth daily. OUT OF IT      Review of Systems DEC 2015: CXR NACPD, ABD U/S 2014: NAIAP EGD/TCS JUL 2015 GASTRITIS    Objective:   Physical Exam  Constitutional: She is oriented to person, place, and time. She appears well-developed and well-nourished. No distress.  HENT:  Head: Normocephalic and atraumatic.  Mouth/Throat: Oropharynx is clear and moist. No oropharyngeal exudate.  Eyes: Pupils are equal, round, and reactive to light. No scleral icterus.  Neck: Normal range of motion. Neck supple.  Cardiovascular: Normal rate, regular rhythm and normal heart sounds.   Pulmonary/Chest: Effort normal and breath sounds normal. No respiratory distress. She exhibits tenderness (OVER R T10-12 RIBS AND T7-9 CARTILAGE MEDIALLY.).  Abdominal: Soft. Bowel sounds are normal. She exhibits no distension. There is no tenderness.  Musculoskeletal: She exhibits no edema.  Lymphadenopathy:    She has no cervical adenopathy.  Neurological: She is alert and oriented to person, place, and time.  Psychiatric: She has a normal mood and affect.  Vitals reviewed.         Assessment & Plan:

## 2014-02-19 NOTE — Progress Notes (Signed)
cc'ed to pcp °

## 2014-05-30 ENCOUNTER — Encounter: Payer: Self-pay | Admitting: Gastroenterology

## 2014-06-19 ENCOUNTER — Ambulatory Visit: Payer: Self-pay | Admitting: Gastroenterology

## 2014-06-24 ENCOUNTER — Encounter: Payer: Self-pay | Admitting: Gastroenterology

## 2014-06-24 ENCOUNTER — Encounter (INDEPENDENT_AMBULATORY_CARE_PROVIDER_SITE_OTHER): Payer: Self-pay

## 2014-06-24 ENCOUNTER — Ambulatory Visit (INDEPENDENT_AMBULATORY_CARE_PROVIDER_SITE_OTHER): Admitting: Gastroenterology

## 2014-06-24 VITALS — BP 112/71 | HR 73 | Temp 97.5°F | Ht 61.0 in | Wt 147.4 lb

## 2014-06-24 DIAGNOSIS — K5901 Slow transit constipation: Secondary | ICD-10-CM | POA: Diagnosis not present

## 2014-06-24 DIAGNOSIS — R1013 Epigastric pain: Secondary | ICD-10-CM

## 2014-06-24 NOTE — Assessment & Plan Note (Signed)
MOST LIKELY DUE TO IBS-C OR ABDOMINAL WALL PAIN.  CONTINUE TO MONITOR SYMPTOMS. CALL WITH QUESTIONS OR CONCERNS. FOLLOW UP IN 1 YEAR.

## 2014-06-24 NOTE — Progress Notes (Signed)
Appt reminder placed on recall list

## 2014-06-24 NOTE — Progress Notes (Signed)
   Subjective:    Patient ID: Terri Newman, female    DOB: 03-12-1960, 54 y.o.   MRN: [id number]  Alonza Bogus, MD  HPI Still having SHARP R side pain. TAKES HER BREATH AWAY. Not every day. When it happens it lasts FOR MINUTES. HAPPENS Q2-3 WEEKS. IF SHE STRETCHES TOO HARD IT MAY BRING ON THE PAIN. TOOK IBUPROFEN. NO WORSE AND DOESN'T LAST LINGER SINCE JAN 2016. WEIGHT STABLE. BMs: 1x/day(COMPLETE). Rare heartburn. linzess made her stomach bloat. NO ETOH, ASPIRIN, BC/GOODY POWDERS, IBUPROFEN/MOTRIN, OR NAPROXEN/ALEVE.    PT DENIES FEVER, CHILLS, HEMATOCHEZIA, nausea, vomiting, melena, diarrhea,  SHORTNESS OF BREATH, CHANGE IN BOWEL IN HABITS, constipation, abdominal pain, or problems swallowing.   Past Medical History  Diagnosis Date  . Sarcoidosis   . Helicobacter pylori gastritis JUL 2015    ABO  BID FOR 10 DAYS  . Adenomatous polyp of colon JUL 2015    TWO, REDUNDNAT LEFT COLON    Past Surgical History  Procedure Laterality Date  . Tubal ligation    . Appendectomy    . Cholecystectomy  Nov 2014    [facility]  . Neck surgery    . Colonoscopy N/A 08/29/2013    Procedure: COLONOSCOPY;  Surgeon: Danie Binder, MD;  Location: [facility];  Service: Endoscopy;  Laterality: N/A;  10:15  . Esophagogastroduodenoscopy N/A 08/29/2013    Procedure: ESOPHAGOGASTRODUODENOSCOPY (EGD);  Surgeon: Danie Binder, MD;  Location: [facility];  Service: Endoscopy;  Laterality: N/A;   No Known Allergies  Current Outpatient Prescriptions  Medication Sig Dispense Refill  . acetaminophen (TYLENOL) 500 MG tablet Take 500 mg by mouth every 6 (six) hours as needed for mild pain.     Marland Kitchen omeprazole (PRILOSEC) 20 MG capsule Take 1 capsule (20 mg total) by mouth daily. 30 minutes before breakfast.    . Probiotic Product (ALIGN) 4 MG CAPS Take 1 capsule by mouth daily.    Marland Kitchen zolpidem (AMBIEN) 5 MG tablet Take 5 mg by mouth at bedtime as needed.    .      .       Review of Systems     Objective:     Physical Exam  Constitutional: She is oriented to person, place, and time. She appears well-developed and well-nourished. No distress.  HENT:  Head: Normocephalic and atraumatic.  Mouth/Throat: Oropharynx is clear and moist. No oropharyngeal exudate.  Eyes: Pupils are equal, round, and reactive to light. No scleral icterus.  Neck: Normal range of motion. Neck supple.  Cardiovascular: Normal rate, regular rhythm and normal heart sounds.   Pulmonary/Chest: Effort normal and breath sounds normal. No respiratory distress.  Abdominal: Soft. Bowel sounds are normal. She exhibits no distension. There is tenderness. There is no rebound and no guarding.  MILD TTP IN THE EPIGASTRIUM, LLQ, RLQ, & RUQ.  Musculoskeletal: She exhibits no edema.  Lymphadenopathy:    She has no cervical adenopathy.  Neurological: She is alert and oriented to person, place, and time.  NO FOCAL DEFICITS   Psychiatric: She has a normal mood and affect.  Vitals reviewed.         Assessment & Plan:

## 2014-06-24 NOTE — Assessment & Plan Note (Signed)
SYMPTOMS FAIRLY WELL CONTROLLED.  DRINK WATER EAT FIBER TAKE A PROBIOTIC DAILY  FOLLOW UP IN 1 YEAR.

## 2014-06-24 NOTE — Patient Instructions (Signed)
DRINK WATER TO KEEP YOUR URINE LIGHT YELLOW.  FOLLOW A HIGH FIBER DIET. AVOID ITEMS THAT CAUSE BLOATING & GAS. SEE INFO BELOW.  TAKE A PROBIOTIC DAILY.  FOLLOW UP IN 1 YEAR.    High-Fiber Diet A high-fiber diet changes your normal diet to include more whole grains, legumes, fruits, and vegetables. Changes in the diet involve replacing refined carbohydrates with unrefined foods. The calorie level of the diet is essentially unchanged. The Dietary Reference Intake (recommended amount) for adult males is 38 grams per day. For adult females, it is 25 grams per day. Pregnant and lactating women should consume 28 grams of fiber per day. Fiber is the intact part of a plant that is not broken down during digestion. Functional fiber is fiber that has been isolated from the plant to provide a beneficial effect in the body. PURPOSE  Increase stool bulk.   Ease and regulate bowel movements.   Lower cholesterol.  INDICATIONS THAT YOU NEED MORE FIBER  Constipation and hemorrhoids.   Uncomplicated diverticulosis (intestine condition) and irritable bowel syndrome.   Weight management.   As a protective measure against hardening of the arteries (atherosclerosis), diabetes, and cancer.   GUIDELINES FOR INCREASING FIBER IN THE DIET  Start adding fiber to the diet slowly. A gradual increase of about 5 more grams (2 slices of whole-wheat bread, 2 servings of most fruits or vegetables, or 1 bowl of high-fiber cereal) per day is best. Too rapid an increase in fiber may result in constipation, flatulence, and bloating.   Drink enough water and fluids to keep your urine clear or pale yellow. Water, juice, or caffeine-free drinks are recommended. Not drinking enough fluid may cause constipation.   Eat a variety of high-fiber foods rather than one type of fiber.   Try to increase your intake of fiber through using high-fiber foods rather than fiber pills or supplements that contain small amounts of fiber.    The goal is to change the types of food eaten. Do not supplement your present diet with high-fiber foods, but replace foods in your present diet.   INCLUDE A VARIETY OF FIBER SOURCES  Replace refined and processed grains with whole grains, canned fruits with fresh fruits, and incorporate other fiber sources. White rice, white breads, and most bakery goods contain little or no fiber.   Brown whole-grain rice, buckwheat oats, and many fruits and vegetables are all good sources of fiber. These include: broccoli, Brussels sprouts, cabbage, cauliflower, beets, sweet potatoes, white potatoes (skin on), carrots, tomatoes, eggplant, squash, berries, fresh fruits, and dried fruits.   Cereals appear to be the richest source of fiber. Cereal fiber is found in whole grains and bran. Bran is the fiber-rich outer coat of cereal grain, which is largely removed in refining. In whole-grain cereals, the bran remains. In breakfast cereals, the largest amount of fiber is found in those with "bran" in their names. The fiber content is sometimes indicated on the label.   You may need to include additional fruits and vegetables each day.   In baking, for 1 cup white flour, you may use the following substitutions:   1 cup whole-wheat flour minus 2 tablespoons.   1/2 cup white flour plus 1/2 cup whole-wheat flour.

## 2014-06-25 NOTE — Progress Notes (Signed)
cc'ed to pcp °

## 2014-12-23 ENCOUNTER — Other Ambulatory Visit (HOSPITAL_COMMUNITY): Payer: Self-pay | Admitting: Pulmonary Disease

## 2014-12-23 ENCOUNTER — Ambulatory Visit (HOSPITAL_COMMUNITY)
Admission: RE | Admit: 2014-12-23 | Discharge: 2014-12-23 | Disposition: A | Discharge status: Home / Self Care | Source: Ambulatory Visit | Attending: Pulmonary Disease | Admitting: Pulmonary Disease

## 2014-12-23 DIAGNOSIS — D869 Sarcoidosis, unspecified: Secondary | ICD-10-CM

## 2015-01-23 DIAGNOSIS — M65311 Trigger thumb, right thumb: Secondary | ICD-10-CM | POA: Insufficient documentation

## 2015-01-23 DIAGNOSIS — M65312 Trigger thumb, left thumb: Secondary | ICD-10-CM | POA: Insufficient documentation

## 2015-05-13 ENCOUNTER — Encounter: Payer: Self-pay | Admitting: Gastroenterology

## 2016-02-12 ENCOUNTER — Other Ambulatory Visit (HOSPITAL_COMMUNITY): Payer: Self-pay | Admitting: Pulmonary Disease

## 2016-02-12 DIAGNOSIS — D869 Sarcoidosis, unspecified: Secondary | ICD-10-CM

## 2016-02-17 ENCOUNTER — Ambulatory Visit (HOSPITAL_COMMUNITY)
Admission: RE | Admit: 2016-02-17 | Discharge: 2016-02-17 | Disposition: A | Discharge status: Home / Self Care | Source: Ambulatory Visit | Attending: Pulmonary Disease | Admitting: Pulmonary Disease

## 2016-02-17 DIAGNOSIS — D869 Sarcoidosis, unspecified: Secondary | ICD-10-CM

## 2016-03-10 ENCOUNTER — Ambulatory Visit (HOSPITAL_COMMUNITY)
Admission: RE | Admit: 2016-03-10 | Discharge: 2016-03-10 | Disposition: A | Discharge status: Home / Self Care | Source: Ambulatory Visit | Attending: Pulmonary Disease | Admitting: Pulmonary Disease

## 2016-03-10 ENCOUNTER — Other Ambulatory Visit (HOSPITAL_COMMUNITY): Payer: Self-pay | Admitting: Pulmonary Disease

## 2016-03-10 DIAGNOSIS — M545 Low back pain: Secondary | ICD-10-CM | POA: Diagnosis present

## 2016-03-10 DIAGNOSIS — M546 Pain in thoracic spine: Secondary | ICD-10-CM | POA: Diagnosis not present

## 2016-03-10 DIAGNOSIS — M47894 Other spondylosis, thoracic region: Secondary | ICD-10-CM | POA: Insufficient documentation

## 2016-03-10 DIAGNOSIS — M47896 Other spondylosis, lumbar region: Secondary | ICD-10-CM | POA: Diagnosis not present

## 2016-08-24 DIAGNOSIS — M65342 Trigger finger, left ring finger: Secondary | ICD-10-CM | POA: Insufficient documentation

## 2017-08-02 ENCOUNTER — Other Ambulatory Visit (HOSPITAL_COMMUNITY): Payer: Self-pay | Admitting: Pulmonary Disease

## 2017-08-02 DIAGNOSIS — R1011 Right upper quadrant pain: Secondary | ICD-10-CM

## 2017-08-05 LAB — CBC
BASO(ABSOLUTE): 21
Basophils: 0.4
Eosinophils Absolute: 80
Eosinophils, %: 1.5
HCT: 37 (ref 29–41)
Hemoglobin: 12.1
Lymphocytes: 35.7
Lymphs Abs: 1892
MCH: 29.3
MCHC: 33.1
MCV: 88.6 (ref 76–111)
MPV: 10.8 fL (ref 0–99.8)
Monocytes(Absolute): 302
Monocytes: 5.7
Neutro Abs: 3005
Neutrophils: 56.7
RBC: 4.13
RDW: 12.6
WBC: 5.3
platelet count: 208

## 2017-08-05 LAB — COMPREHENSIVE METABOLIC PANEL
ALBUMIN/GLOBULIN RATIO: 1.4
ALT: 12 (ref 3–30)
AST: 13
Albumin: 4.1 (ref 3.5–5.0)
Alkaline Phosphatase: 75
BUN: 17 (ref 4–21)
Calcium: 9.5
Carbon Dioxide, Total: 29
Chloride: 104
Creat: 0.82
EGFR (African American): 93
EGFR (Non-African Amer.): 80
Globulin: 2.9
Glucose: 91
Potassium: 4.5
Sodium: 138
Total Bilirubin: 0.4
Total Protein: 7 (ref 6.4–8.2)

## 2017-08-05 LAB — LIPID PANEL
Cholesterol: 213
HDL Cholesterol: 49 (ref 35–70)
LDL Cholesterol: 148
Non HDL Cholesterol: 164
Total CHOL/HDL Ratio: 4.3
Triglycerides: 67 (ref 40–160)

## 2017-08-05 LAB — TSH: TSH: 1.96

## 2017-08-05 LAB — LIPASE: Lipase: 19

## 2017-08-19 ENCOUNTER — Ambulatory Visit (HOSPITAL_COMMUNITY)
Admission: RE | Admit: 2017-08-19 | Discharge: 2017-08-19 | Disposition: A | Discharge status: Home / Self Care | Source: Ambulatory Visit | Attending: Pulmonary Disease | Admitting: Pulmonary Disease

## 2017-08-19 ENCOUNTER — Encounter (HOSPITAL_COMMUNITY): Payer: Self-pay

## 2017-08-19 DIAGNOSIS — I700 Atherosclerosis of aorta: Secondary | ICD-10-CM | POA: Diagnosis not present

## 2017-08-19 DIAGNOSIS — R1011 Right upper quadrant pain: Secondary | ICD-10-CM | POA: Insufficient documentation

## 2017-08-19 MED ORDER — IOPAMIDOL (ISOVUE-300) INJECTION 61%
100.0000 mL | Freq: Once | INTRAVENOUS | Status: AC | PRN
  Administered 2017-08-19: 100 mL via INTRAVENOUS

## 2018-03-07 ENCOUNTER — Other Ambulatory Visit (HOSPITAL_COMMUNITY): Payer: Self-pay | Admitting: Pulmonary Disease

## 2018-03-07 DIAGNOSIS — M549 Dorsalgia, unspecified: Secondary | ICD-10-CM

## 2018-03-15 ENCOUNTER — Ambulatory Visit (HOSPITAL_COMMUNITY)
Admission: RE | Admit: 2018-03-15 | Discharge: 2018-03-15 | Disposition: A | Discharge status: Home / Self Care | Source: Ambulatory Visit | Attending: Pulmonary Disease | Admitting: Pulmonary Disease

## 2018-03-15 DIAGNOSIS — M549 Dorsalgia, unspecified: Secondary | ICD-10-CM | POA: Diagnosis not present

## 2018-03-29 DIAGNOSIS — M5136 Other intervertebral disc degeneration, lumbar region: Secondary | ICD-10-CM | POA: Insufficient documentation

## 2018-05-04 DIAGNOSIS — M79642 Pain in left hand: Secondary | ICD-10-CM | POA: Insufficient documentation

## 2018-05-04 DIAGNOSIS — M79641 Pain in right hand: Secondary | ICD-10-CM | POA: Insufficient documentation

## 2018-09-01 ENCOUNTER — Encounter: Payer: Self-pay | Admitting: Gastroenterology

## 2019-02-04 ENCOUNTER — Other Ambulatory Visit: Payer: Self-pay

## 2019-02-04 ENCOUNTER — Encounter (HOSPITAL_COMMUNITY): Payer: Self-pay | Admitting: *Deleted

## 2019-02-04 ENCOUNTER — Emergency Department (HOSPITAL_COMMUNITY)

## 2019-02-04 DIAGNOSIS — Z79899 Other long term (current) drug therapy: Secondary | ICD-10-CM | POA: Diagnosis not present

## 2019-02-04 DIAGNOSIS — R0789 Other chest pain: Secondary | ICD-10-CM | POA: Insufficient documentation

## 2019-02-04 MED ORDER — SODIUM CHLORIDE 0.9% FLUSH: 3.0000 mL | Freq: Once | INTRAVENOUS | Status: DC

## 2019-02-04 NOTE — ED Triage Notes (Signed)
Pt c/o left side chest pain with sob, nausea and radiation to left side of facial area that started two days ago,

## 2019-02-05 ENCOUNTER — Emergency Department (HOSPITAL_COMMUNITY)

## 2019-02-05 ENCOUNTER — Emergency Department (HOSPITAL_COMMUNITY)
Admission: EM | Admit: 2019-02-05 | Discharge: 2019-02-05 | Disposition: A | Discharge status: Home / Self Care | Attending: Emergency Medicine | Admitting: Emergency Medicine

## 2019-02-05 DIAGNOSIS — R0789 Other chest pain: Secondary | ICD-10-CM

## 2019-02-05 LAB — CBC
HCT: 36.4 % (ref 36.0–46.0)
Hemoglobin: 11.6 g/dL — ABNORMAL LOW (ref 12.0–15.0)
MCH: 29.4 pg (ref 26.0–34.0)
MCHC: 31.9 g/dL (ref 30.0–36.0)
MCV: 92.4 fL (ref 80.0–100.0)
Platelets: 231 10*3/uL (ref 150–400)
RBC: 3.94 MIL/uL (ref 3.87–5.11)
RDW: 13 % (ref 11.5–15.5)
WBC: 6.4 10*3/uL (ref 4.0–10.5)
nRBC: 0 % (ref 0.0–0.2)

## 2019-02-05 LAB — BASIC METABOLIC PANEL
Anion gap: 6 (ref 5–15)
BUN: 17 mg/dL (ref 6–20)
CO2: 28 mmol/L (ref 22–32)
Calcium: 9.4 mg/dL (ref 8.9–10.3)
Chloride: 105 mmol/L (ref 98–111)
Creatinine, Ser: 1 mg/dL (ref 0.44–1.00)
GFR calc Af Amer: >60 mL/min (ref >60)
GFR calc non Af Amer: >60 mL/min (ref >60)
Glucose, Bld: 101 mg/dL — ABNORMAL HIGH (ref 70–99)
Potassium: 4.2 mmol/L (ref 3.5–5.1)
Sodium: 139 mmol/L (ref 135–145)

## 2019-02-05 LAB — D-DIMER, QUANTITATIVE: D-Dimer, Quant: 1.56 ug/mL-FEU — ABNORMAL HIGH (ref 0.00–0.50)

## 2019-02-05 LAB — TROPONIN I (HIGH SENSITIVITY)
Troponin I (High Sensitivity): <2 ng/L (ref <18)
Troponin I (High Sensitivity): <2 ng/L (ref <18)

## 2019-02-05 MED ORDER — IBUPROFEN 600 MG PO TABS: 600.0000 mg | ORAL_TABLET | Freq: Four times a day (QID) | ORAL | 0 refills | Status: DC | PRN

## 2019-02-05 MED ORDER — METHOCARBAMOL 500 MG PO TABS: ORAL_TABLET | ORAL | 0 refills | Status: DC

## 2019-02-05 MED ORDER — METHOCARBAMOL 500 MG PO TABS
500.0000 mg | ORAL_TABLET | Freq: Once | ORAL | Status: AC
  Administered 2019-02-05: 500 mg via ORAL
  Filled 2019-02-05: qty 1

## 2019-02-05 MED ORDER — IBUPROFEN 400 MG PO TABS
600.0000 mg | ORAL_TABLET | Freq: Once | ORAL | Status: AC
  Administered 2019-02-05: 600 mg via ORAL
  Filled 2019-02-05: qty 2

## 2019-02-05 MED ORDER — IOHEXOL 350 MG/ML SOLN
100.0000 mL | Freq: Once | INTRAVENOUS | Status: AC | PRN
  Administered 2019-02-05: 100 mL via INTRAVENOUS

## 2019-02-05 NOTE — ED Provider Notes (Signed)
[facility] EMERGENCY DEPARTMENT Provider Note   CSN: [id number] Arrival date & time: 02/04/19  2318   Time seen 3:17 AM  History Chief Complaint  Patient presents with  . Chest Pain    Terri Newman is a 59 y.o. female.  HPI patient states she started getting left-sided chest pain on January 2.  The pain has been there constantly.  She states laying on her left side and laying down makes the pain worse.  She states may be deep breaths and movement made it feel worse but then later she said it did not.  Nothing she does makes the pain feel better.  She has had mild shortness of breath.  She has had mild nausea without vomiting.  She states the pain sort of radiates up into her left shoulder and into the back of her left neck.  She states she is never had this before.  She denies any family history of coronary artery disease.  She denies history of diabetes, hypertension or history of smoking.  She denies any change in her activity.  She does have sarcoidosis but is currently on no medications.  She states at the current time her pain is a 7-8 out of 10 and at its worst was a 9.   PCP Sinda Du, MD   Past Medical History:  Diagnosis Date  . Adenomatous polyp of colon JUL 2015   TWO, REDUNDNAT LEFT COLON  . Helicobacter pylori gastritis JUL 2015   ABO  BID FOR 10 DAYS  . Sarcoidosis   . Sarcoidosis     Patient Active Problem List   Diagnosis Date Noted  . Anterior chest wall pain 02/14/2014  . Dyspepsia 08/23/2013  . 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 APPENDECTOMY    . CHOLECYSTECTOMY  Nov 2014   [facility]  . COLONOSCOPY N/A 08/29/2013   Procedure: COLONOSCOPY;  Surgeon: Danie Binder, MD;  Location: [facility] ENDO SUITE;  Service: Endoscopy;  Laterality: N/A;  10:15  . ESOPHAGOGASTRODUODENOSCOPY N/A 08/29/2013   Procedure: ESOPHAGOGASTRODUODENOSCOPY (EGD);  Surgeon: Danie Binder, MD;  Location: [facility] ENDO  SUITE;  Service: Endoscopy;  Laterality: N/A;  . NECK SURGERY    . TUBAL LIGATION       OB History   No obstetric history on file.     Family History  Problem Relation Age of Onset  . Colon cancer Neg Hx     Social History   Tobacco Use  . Smoking status: Never Smoker  . Smokeless tobacco: Never Used  Substance Use Topics  . Alcohol use: No  . 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 Authorizing Provider  acetaminophen (TYLENOL) 500 MG tablet Take 500 mg by mouth every 6 (six) hours as needed for mild pain.     [provider]  ciprofloxacin (CIPRO) 250 MG tablet Take 250 mg by mouth 2 (two) times daily. 06/18/14   [provider]  ibuprofen (ADVIL) 600 MG tablet Take 1 tablet (600 mg total) by mouth every 6 (six) hours as needed. 02/05/19   Rolland Porter, MD  Linaclotide [facility]) 145 MCG CAPS capsule Take 1 capsule (145 mcg total) by mouth daily. 30 minutes before breakfast Patient not taking: Reported on 02/14/2014 08/23/13   Annitta Needs, NP  methocarbamol (ROBAXIN) 500 MG tablet Take 1 or  2 po Q 6hrs for pain 02/05/19   Rolland Porter, MD  omeprazole (PRILOSEC) 20 MG capsule Take 1 capsule (20 mg total) by mouth daily. 30 minutes before breakfast. 08/23/13   Annitta Needs, NP  Probiotic Product (ALIGN) 4 MG CAPS Take 1 capsule by mouth daily.    [provider]  zolpidem (AMBIEN) 5 MG tablet Take 5 mg by mouth at bedtime as needed. 01/15/14   [provider]    Allergies    Patient has no known allergies.  Review of Systems   Review of Systems  All other systems reviewed and are negative.   Physical Exam Updated Vital Signs BP 122/61   Pulse 69   Temp 98 F (36.7 C) (Oral)   Resp 18   Ht 5' (1.524 m)   Wt 60.3 kg   SpO2 100%   BMI 25.97 kg/m   Vital signs normal    Physical Exam Vitals and nursing note reviewed.    Constitutional:      General: She is not in acute distress.    Appearance: Normal appearance. She is well-developed. She is not ill-appearing or toxic-appearing.  HENT:     Head: Normocephalic and atraumatic.     Right Ear: External ear normal.     Left Ear: External ear normal.     Nose: Nose normal. No mucosal edema or rhinorrhea.     Mouth/Throat:     Dentition: No dental abscesses.     Pharynx: No uvula swelling.  Eyes:     Extraocular Movements: Extraocular movements intact.     Conjunctiva/sclera: Conjunctivae normal.     Pupils: Pupils are equal, round, and reactive to light.  Cardiovascular:     Rate and Rhythm: Normal rate and regular rhythm.     Heart sounds: Normal heart sounds. No murmur. No friction rub. No gallop.   Pulmonary:     Effort: Pulmonary effort is normal. No respiratory distress.     Breath sounds: Normal breath sounds. No wheezing, rhonchi or rales.  Chest:     Chest wall: No tenderness or crepitus.       Comments: Area of pain noted Abdominal:     General: Bowel sounds are normal. There is no distension.     Palpations: Abdomen is soft.     Tenderness: There is no abdominal tenderness. There is no guarding or rebound.  Musculoskeletal:        General: No tenderness. Normal range of motion.     Cervical back: Full passive range of motion without pain, normal range of motion and neck supple.     Comments: Moves all extremities well.   Skin:    General: Skin is warm and dry.     Coloration: Skin is not pale.     Findings: No erythema or rash.  Neurological:     General: No focal deficit present.     Mental Status: She is alert and oriented to person, place, and time.     Cranial Nerves: No cranial nerve deficit.  Psychiatric:        Mood and Affect: Mood normal. Mood is not anxious.        Speech: Speech normal.        Behavior: Behavior normal.        Thought Content: Thought content normal.     ED Results / Procedures / Treatments    Labs (all labs ordered are listed, but only abnormal results are displayed) Results for orders  placed or performed during the hospital encounter of [id number]  Basic metabolic panel  Result Value Ref Range   Sodium 139 135 - 145 mmol/L   Potassium 4.2 3.5 - 5.1 mmol/L   Chloride 105 98 - 111 mmol/L   CO2 28 22 - 32 mmol/L   Glucose, Bld 101 (H) 70 - 99 mg/dL   BUN 17 6 - 20 mg/dL   Creatinine, Ser 1.00 0.44 - 1.00 mg/dL   Calcium 9.4 8.9 - 10.3 mg/dL   GFR calc non Af Amer >60 >60 mL/min   GFR calc Af Amer >60 >60 mL/min   Anion gap 6 5 - 15  CBC  Result Value Ref Range   WBC 6.4 4.0 - 10.5 K/uL   RBC 3.94 3.87 - 5.11 MIL/uL   Hemoglobin 11.6 (L) 12.0 - 15.0 g/dL   HCT 36.4 36.0 - 46.0 %   MCV 92.4 80.0 - 100.0 fL   MCH 29.4 26.0 - 34.0 pg   MCHC 31.9 30.0 - 36.0 g/dL   RDW 13.0 11.5 - 15.5 %   Platelets 231 150 - 400 K/uL   nRBC 0.0 0.0 - 0.2 %  D-dimer, quantitative  Result Value Ref Range   D-Dimer, Quant 1.56 (H) 0.00 - 0.50 ug/mL-FEU  Troponin I (High Sensitivity)  Result Value Ref Range   Troponin I (High Sensitivity) <2.0 <18 ng/L  Troponin I (High Sensitivity)  Result Value Ref Range   Troponin I (High Sensitivity) <2.0 <18 ng/L   Laboratory interpretation all normal except elevated D-dimer    EKG EKG Interpretation  Date/Time:  Sunday February 04 2019 23:31:16 EST Ventricular Rate:  71 PR Interval:  162 QRS Duration: 96 QT Interval:  388 QTC Calculation: 421 R Axis:   -5 Text Interpretation: Normal sinus rhythm Incomplete right bundle branch block Otherwise within normal limits No old tracing to compare Confirmed by Rolland Porter [phone]) on 02/05/2019 12:03:49 AM   Radiology DG Chest 2 View  Result Date: 02/05/2019 CLINICAL DATA:  Left-sided chest pain. Shortness of breath. Nausea. History of sarcoidosis. EXAM: CHEST - 2 VIEW COMPARISON:  Radiograph 02/17/2016 FINDINGS: The cardiomediastinal contours are normal. The lungs are clear. Pulmonary vasculature is  normal. No consolidation, pleural effusion, or pneumothorax. No acute osseous abnormalities are seen. Surgical hardware in the lower cervical spine is partially included. IMPRESSION: No acute chest findings. Electronically Signed   By: Keith Rake M.D.   On: 02/05/2019 00:13     CT Angio Chest PE W/Cm &/Or Wo Cm  Result Date: 02/05/2019 CLINICAL DATA:  Atypical chest pain, mild shortness of breath, elevated D-dimer. Additional history provided: Patient reports chest pain with shortness of breath, nausea and radiation to left side of the face beginning 2 days ago. Sharp, constant pain left side of chest. EXAM: CT ANGIOGRAPHY CHEST WITH CONTRAST TECHNIQUE: Multidetector CT imaging of the chest was performed using the standard protocol during bolus administration of intravenous contrast. Multiplanar CT image reconstructions and MIPs were obtained to evaluate the vascular anatomy. CONTRAST:  155mL OMNIPAQUE IOHEXOL 350 MG/ML SOLN COMPARISON:  Chest radiograph 02/05/2019 FINDINGS: CARDIOVASCULAR: Normal heart size.No pericardial effusion.No significant vascular finding. Satisfactory opacification of the pulmonary arteries to the segmental level. No pulmonary artery filling defect is demonstrated to suggest pulmonary embolus. MEDIASTINUM/NODES: No enlarged mediastinal, hilar or axillary lymph nodes. The visualized thyroid gland is unremarkable. LUNGS/PLEURA: No airspace consolidation. Minimal linear atelectasis within the anterior right middle lobe and left lower lobe. No pleural effusion or pneumothorax.The central airways  are grossly patent. UPPER ABDOMEN: No acute abnormality. MUSCULOSKELETAL: No acute bony abnormality. Review of the MIP images confirms the above findings. IMPRESSION: No evidence of pulmonary embolus. Minimal linear atelectasis within the anterior right middle lobe and left lower lobe. No airspace consolidation. Electronically Signed   By: Kellie Simmering DO   On: 02/05/2019 06:20      Procedures Procedures (including critical care time)  Medications Ordered in ED Medications  sodium chloride flush (NS) 0.9 % injection 3 mL (has no administration in time range)  ibuprofen (ADVIL) tablet 600 mg (600 mg Oral Given 02/05/19 0340)  methocarbamol (ROBAXIN) tablet 500 mg (500 mg Oral Given 02/05/19 0340)  iohexol (OMNIPAQUE) 350 MG/ML injection 100 mL (100 mLs Intravenous Contrast Given 02/05/19 0553)    ED Course  I have reviewed the triage vital signs and the nursing notes.  Pertinent labs & imaging results that were available during my care of the patient were reviewed by me and considered in my medical decision making (see chart for details).    MDM Rules/Calculators/A&P                      Routine cardiac testing was initiated.  After talking to the patient I added a D-dimer to her lab work.  She was given ibuprofen and Robaxin while waiting on her test results.  Although she did not have pain to palpation and there was no actual pleuritic component I thought it might help her pain.  Patient's D-dimer was elevated, CTA of the chest was done.  6:40 AM patient CTA is without acute PE or pneumonia.  Patient states the medications she was given have helped her discomfort.  She was discharged home on similar medication.  We discussed she can check her Covid test result in the next 24 to 48 hours in my chart.  She should be rechecked if she gets fever, struggles to breathe or seems worse.  Final Clinical Impression(s) / ED Diagnoses Final diagnoses:  Atypical chest pain    Rx / DC Orders ED Discharge Orders         Ordered    methocarbamol (ROBAXIN) 500 MG tablet     02/05/19 0642    ibuprofen (ADVIL) 600 MG tablet  Every 6 hours PRN     02/05/19 [id number]          Plan discharge  Rolland Porter, MD, Barbette Or, MD 02/05/19 [phone]

## 2019-02-05 NOTE — Discharge Instructions (Addendum)
Take the medication as prescribed.  You can try ice and heat to see if that helps.  Recheck if you get fever, cough, or struggle to breathe.  You can check your Covid test results in MyChart in the next 24 to 48 hours.

## 2019-02-28 ENCOUNTER — Ambulatory Visit (INDEPENDENT_AMBULATORY_CARE_PROVIDER_SITE_OTHER): Admitting: Gastroenterology

## 2019-02-28 ENCOUNTER — Encounter: Payer: Self-pay | Admitting: Gastroenterology

## 2019-02-28 ENCOUNTER — Other Ambulatory Visit: Payer: Self-pay

## 2019-02-28 VITALS — BP 117/70 | HR 101 | Temp 96.9°F | Ht 60.0 in | Wt 138.2 lb

## 2019-02-28 DIAGNOSIS — R1013 Epigastric pain: Secondary | ICD-10-CM | POA: Diagnosis not present

## 2019-02-28 DIAGNOSIS — R1010 Upper abdominal pain, unspecified: Secondary | ICD-10-CM | POA: Diagnosis not present

## 2019-02-28 DIAGNOSIS — Z86010 Personal history of colonic polyps: Secondary | ICD-10-CM | POA: Diagnosis not present

## 2019-02-28 NOTE — Progress Notes (Signed)
CC'ED TO PCP 

## 2019-02-28 NOTE — Patient Instructions (Signed)
1. Colonoscopy to be scheduled. See separate instructions. 2. Please have labs done at [facility] with your next episode of abdominal pain. Try to go 6 hours after onset of pain, or next next morning if it occurs during night.  3. Call with your next episode of abdominal pain.

## 2019-02-28 NOTE — Assessment & Plan Note (Signed)
Due for surveillance colonoscopy.  Schedule in the near future.  I have discussed the risks, alternatives, benefits with regards to but not limited to the risk of reaction to medication, bleeding, infection, perforation and the patient is agreeable to proceed. Written consent to be obtained.

## 2019-02-28 NOTE — Assessment & Plan Note (Signed)
Several year history of rare severe epigastric pain occurring 1-2 times per year.  Symptoms occur acutely, lasting 30 to 45 minutes at a time.  Recent episode occurred off of pantoprazole for her week.  She denies typical heartburn.  No radiation into the chest or into the back.  Gallbladder removed several years back, denies having stones.  Symptoms may be related to dyspepsia, atypical GERD, less likely biliary or pancreatic.  We will continue to monitor symptoms for now.  With next episode obtain labs 6 to 8 hours afterwards.  She can try holding pantoprazole for several weeks to see if she has any recurrent symptoms given that she does not have any typical heartburn and has no evidence of complicated GERD prior endoscopy.  She will call with next episode.

## 2019-02-28 NOTE — Progress Notes (Signed)
Primary Care Physician:  Sinda Du, MD  Primary Gastroenterologist:  Barney Drain, MD   Chief Complaint  Patient presents with  . Abdominal Pain    chest tightness,stomach becomes hard with severe pain and feels like it is constricting    HPI:  Terri Newman is a 59 y.o. female here for further evaluation of abdominal pain and chest tightness.  She was last seen in 2016.  EGD and colonoscopy in July 2015: She had H. pylori gastritis (treated with amoxicillin/Biaxin/omeprazole), four polyps removed from the colon, simple adenomas, recommended 5-year surveillance.  Patient was seen in the ED January 4 with complaints of left-sided chest pain.  Deep breaths made it worse.  Mild shortness of breath.  No nausea.  D-dimer was slightly elevated.  CTA chest was done and negative for PE or pneumonia.  Presents today for intermittent severe epigastric pain.  Occurring about twice per year.  Recent episode last week.  Pain in the epigastrium, will develop acutely last for 30 to 45 minutes.  Intense, makes it difficult to breathe.  Walks the floors and sips on water until subsided.  Will feel drained afterwards.  With one episode of this occurred after an alcoholic beverage.  Other times has woken up due to pain.  She feels like food would make it worse so she will often not the day.  Avoids all fried foods.  Tries to eat clean.  No nausea or vomiting.  States she was diagnosed with GERD and placed on pantoprazole daily about 2 years ago.  Denies ever having typical heartburn.  With her recent episode of epigastric pain, this occurred after stopping pantoprazole for couple weeks.  That particular day she was also taken TheraFlu and very empty stomach and she wonders if this contributed to her symptoms.  On a daily basis she does well.  No heartburn.  Appetite good.  Bowel movements regular.  No blood in the stool or melena.  Denies aspirin use.  Rare ibuprofen use.    Patient is interested in  having a colonoscopy.  She was supposed to come back for surveillance in the fall 2020.  She did not tolerate trial likely due to nausea and vomiting.  She would like to have a small prep.  Current Outpatient Medications  Medication Sig Dispense Refill  . ibuprofen (ADVIL) 600 MG tablet Take 1 tablet (600 mg total) by mouth every 6 (six) hours as needed. 40 tablet 0  . pantoprazole (PROTONIX) 40 MG tablet Take 40 mg by mouth daily.     No current facility-administered medications for this visit.    Allergies as of 02/28/2019  . (No Known Allergies)    Past Medical History:  Diagnosis Date  . Adenomatous polyp of colon JUL 2015   TWO, REDUNDNAT LEFT COLON  . Helicobacter pylori gastritis JUL 2015   ABO  BID FOR 10 DAYS  . Sarcoidosis   . Sarcoidosis     Past Surgical History:  Procedure Laterality Date  . APPENDECTOMY    . CHOLECYSTECTOMY  Nov 2014   [facility]  . COLONOSCOPY N/A 08/29/2013   SLF: Several simple adenomas removed, moderate sized internal hemorrhoids, slightly redundant left colon.  . ESOPHAGOGASTRODUODENOSCOPY N/A 08/29/2013   SLF: H. pylori gastritis treated with amoxicillin/Biaxin/omeprazole  . NECK SURGERY    . TUBAL LIGATION      Family History  Problem Relation Age of Onset  . Colon cancer Neg Hx     Social History   Socioeconomic History  .  Marital status: Married    Spouse name: Not on file  . Number of children: Not on file  . Years of education: Not on file  . Highest education level: Not on file  Occupational History  . Occupation: caregiver  Tobacco Use  . Smoking status: Never Smoker  . Smokeless tobacco: Never Used  Substance and Sexual Activity  . Alcohol use: No  . Drug use: No  . Sexual activity: Not on file  Other Topics Concern  . Not on file  Social History Narrative  . Not on file   Social Determinants of Health   Financial Resource Strain:   . Difficulty of Paying Living Expenses: Not on file  Food Insecurity:   .  Worried About Charity fundraiser in the Last Year: Not on file  . Ran Out of Food in the Last Year: Not on file  Transportation Needs:   . Lack of Transportation (Medical): Not on file  . Lack of Transportation (Non-Medical): Not on file  Physical Activity:   . Days of Exercise per Week: Not on file  . Minutes of Exercise per Session: Not on file  Stress:   . Feeling of Stress : Not on file  Social Connections:   . Frequency of Communication with Friends and Family: Not on file  . Frequency of Social Gatherings with Friends and Family: Not on file  . Attends Religious Services: Not on file  . Active Member of Clubs or Organizations: Not on file  . Attends Archivist Meetings: Not on file  . Marital Status: Not on file  Intimate Partner Violence:   . Fear of Current or Ex-Partner: Not on file  . Emotionally Abused: Not on file  . Physically Abused: Not on file  . Sexually Abused: Not on file      ROS:  General: Negative for anorexia, weight loss, fever, chills, fatigue, weakness. Eyes: Negative for vision changes.  ENT: Negative for hoarseness, difficulty swallowing , nasal congestion. CV: Negative for chest pain, angina, palpitations, dyspnea on exertion, peripheral edema.  Respiratory: Negative for dyspnea at rest, dyspnea on exertion, cough, sputum, wheezing.  GI: See history of present illness. GU:  Negative for dysuria, hematuria, urinary incontinence, urinary frequency, nocturnal urination.  MS: Negative for joint pain, low back pain.  Derm: Negative for rash or itching.  Neuro: Negative for weakness, abnormal sensation, seizure, frequent headaches, memory loss, confusion.  Psych: Negative for anxiety, depression, suicidal ideation, hallucinations.  Endo: Negative for unusual weight change.  Heme: Negative for bruising or bleeding. Allergy: Negative for rash or hives.    Physical Examination:  BP 117/70   Pulse (!) 101   Temp (!) 96.9 F (36.1 C)   Ht  5' (1.524 m)   Wt 138 lb 3.2 oz (62.7 kg)   BMI 26.99 kg/m    General: Well-nourished, well-developed in no acute distress.  Head: Normocephalic, atraumatic.   Eyes: Conjunctiva pink, no icterus. Mouth: masked. Neck: Supple without thyromegaly, masses, or lymphadenopathy.  Lungs: Clear to auscultation bilaterally.  Heart: Regular rate and rhythm, no murmurs rubs or gallops.  Abdomen: Bowel sounds are normal, nontender, nondistended, no hepatosplenomegaly or masses, no abdominal bruits or    hernia , no rebound or guarding.   Rectal: not performed Extremities: No lower extremity edema. No clubbing or deformities.  Neuro: Alert and oriented x 4 , grossly normal neurologically.  Skin: Warm and dry, no rash or jaundice.   Psych: Alert and cooperative, normal  mood and affect.  Labs: Lab Results  Component Value Date   WBC 6.4 02/05/2019   HGB 11.6 (L) 02/05/2019   HCT 36.4 02/05/2019   MCV 92.4 02/05/2019   PLT 231 02/05/2019   Lab Results  Component Value Date   CREATININE 1.00 02/05/2019   BUN 17 02/05/2019   NA 139 02/05/2019   K 4.2 02/05/2019   CL 105 02/05/2019   CO2 28 02/05/2019    Lab Results  Component Value Date   DDIMER 1.56 (H) 02/05/2019     Imaging Studies: DG Chest 2 View  Result Date: 02/05/2019 CLINICAL DATA:  Left-sided chest pain. Shortness of breath. Nausea. History of sarcoidosis. EXAM: CHEST - 2 VIEW COMPARISON:  Radiograph 02/17/2016 FINDINGS: The cardiomediastinal contours are normal. The lungs are clear. Pulmonary vasculature is normal. No consolidation, pleural effusion, or pneumothorax. No acute osseous abnormalities are seen. Surgical hardware in the lower cervical spine is partially included. IMPRESSION: No acute chest findings. Electronically Signed   By: Keith Rake M.D.   On: 02/05/2019 00:13   CT Angio Chest PE W/Cm &/Or Wo Cm  Result Date: 02/05/2019 CLINICAL DATA:  Atypical chest pain, mild shortness of breath, elevated D-dimer.  Additional history provided: Patient reports chest pain with shortness of breath, nausea and radiation to left side of the face beginning 2 days ago. Sharp, constant pain left side of chest. EXAM: CT ANGIOGRAPHY CHEST WITH CONTRAST TECHNIQUE: Multidetector CT imaging of the chest was performed using the standard protocol during bolus administration of intravenous contrast. Multiplanar CT image reconstructions and MIPs were obtained to evaluate the vascular anatomy. CONTRAST:  116mL OMNIPAQUE IOHEXOL 350 MG/ML SOLN COMPARISON:  Chest radiograph 02/05/2019 FINDINGS: CARDIOVASCULAR: Normal heart size.No pericardial effusion.No significant vascular finding. Satisfactory opacification of the pulmonary arteries to the segmental level. No pulmonary artery filling defect is demonstrated to suggest pulmonary embolus. MEDIASTINUM/NODES: No enlarged mediastinal, hilar or axillary lymph nodes. The visualized thyroid gland is unremarkable. LUNGS/PLEURA: No airspace consolidation. Minimal linear atelectasis within the anterior right middle lobe and left lower lobe. No pleural effusion or pneumothorax.The central airways are grossly patent. UPPER ABDOMEN: No acute abnormality. MUSCULOSKELETAL: No acute bony abnormality. Review of the MIP images confirms the above findings. IMPRESSION: No evidence of pulmonary embolus. Minimal linear atelectasis within the anterior right middle lobe and left lower lobe. No airspace consolidation. Electronically Signed   By: Kellie Simmering DO   On: 02/05/2019 06:20

## 2019-03-05 ENCOUNTER — Telehealth: Payer: Self-pay

## 2019-03-05 NOTE — Telephone Encounter (Signed)
Tried to call pt to schedule TCS w/SLF, no answer, LMOVM for return call.

## 2019-03-05 NOTE — Telephone Encounter (Signed)
Pt called back and rescheduled her procedure to 05/18/2019.  Pt is aware that we will mail out new prep instructions and Covid screening information.  Pt voiced understanding.

## 2019-03-06 ENCOUNTER — Encounter: Payer: Self-pay | Admitting: *Deleted

## 2019-05-15 ENCOUNTER — Other Ambulatory Visit (HOSPITAL_COMMUNITY)
Admission: RE | Admit: 2019-05-15 | Discharge: 2019-05-15 | Disposition: A | Discharge status: Home / Self Care | Source: Ambulatory Visit | Attending: Gastroenterology | Admitting: Gastroenterology

## 2019-05-15 ENCOUNTER — Other Ambulatory Visit: Payer: Self-pay

## 2019-05-16 ENCOUNTER — Other Ambulatory Visit: Payer: Self-pay

## 2019-05-16 ENCOUNTER — Telehealth: Payer: Self-pay | Admitting: *Deleted

## 2019-05-16 ENCOUNTER — Other Ambulatory Visit (HOSPITAL_COMMUNITY)

## 2019-05-16 ENCOUNTER — Other Ambulatory Visit (HOSPITAL_COMMUNITY)
Admission: RE | Admit: 2019-05-16 | Discharge: 2019-05-16 | Disposition: A | Discharge status: Home / Self Care | Source: Ambulatory Visit | Attending: Gastroenterology | Admitting: Gastroenterology

## 2019-05-16 DIAGNOSIS — Z20822 Contact with and (suspected) exposure to covid-19: Secondary | ICD-10-CM | POA: Insufficient documentation

## 2019-05-16 DIAGNOSIS — Z01812 Encounter for preprocedural laboratory examination: Secondary | ICD-10-CM | POA: Insufficient documentation

## 2019-05-16 LAB — SARS CORONAVIRUS 2 (TAT 6-24 HRS): SARS Coronavirus 2: NEGATIVE

## 2019-05-16 NOTE — Telephone Encounter (Signed)
Called pt back and she said that she thought her Covid screening was for today.  Informed pt that I would call Endo at [facility] and see if they have anyone that could do it today.  Spoke with KIm and she said that pt could go to [facility] today to have done.  Called pt back and informed her.  Pt agreed and voiced understanding.

## 2019-05-16 NOTE — Telephone Encounter (Signed)
Received call from endo patient was no show for covid test. Procedure scheduled for friday

## 2019-05-18 ENCOUNTER — Ambulatory Visit (HOSPITAL_COMMUNITY)
Admission: RE | Admit: 2019-05-18 | Discharge: 2019-05-18 | Disposition: A | Discharge status: Home / Self Care | Attending: Gastroenterology | Admitting: Gastroenterology

## 2019-05-18 ENCOUNTER — Encounter (HOSPITAL_COMMUNITY)
Admission: RE | Disposition: A | Discharge status: Home / Self Care | Payer: Self-pay | Source: Home / Self Care | Attending: Gastroenterology

## 2019-05-18 ENCOUNTER — Encounter (HOSPITAL_COMMUNITY): Payer: Self-pay | Admitting: Gastroenterology

## 2019-05-18 ENCOUNTER — Other Ambulatory Visit: Payer: Self-pay

## 2019-05-18 DIAGNOSIS — K635 Polyp of colon: Secondary | ICD-10-CM | POA: Diagnosis not present

## 2019-05-18 DIAGNOSIS — Z86010 Personal history of colonic polyps: Secondary | ICD-10-CM | POA: Insufficient documentation

## 2019-05-18 DIAGNOSIS — K644 Residual hemorrhoidal skin tags: Secondary | ICD-10-CM | POA: Insufficient documentation

## 2019-05-18 DIAGNOSIS — Z1211 Encounter for screening for malignant neoplasm of colon: Secondary | ICD-10-CM | POA: Insufficient documentation

## 2019-05-18 DIAGNOSIS — D122 Benign neoplasm of ascending colon: Secondary | ICD-10-CM | POA: Diagnosis not present

## 2019-05-18 DIAGNOSIS — Q438 Other specified congenital malformations of intestine: Secondary | ICD-10-CM | POA: Insufficient documentation

## 2019-05-18 DIAGNOSIS — D869 Sarcoidosis, unspecified: Secondary | ICD-10-CM | POA: Diagnosis not present

## 2019-05-18 DIAGNOSIS — Z79899 Other long term (current) drug therapy: Secondary | ICD-10-CM | POA: Insufficient documentation

## 2019-05-18 HISTORY — PX: COLONOSCOPY: SHX5424

## 2019-05-18 HISTORY — PX: POLYPECTOMY: SHX5525

## 2019-05-18 SURGERY — COLONOSCOPY
Anesthesia: Moderate Sedation

## 2019-05-18 MED ORDER — MEPERIDINE HCL 100 MG/ML IJ SOLN
INTRAMUSCULAR | Status: DC | PRN
  Administered 2019-05-18 (×2): 25 mg via INTRAVENOUS

## 2019-05-18 MED ORDER — MIDAZOLAM HCL 5 MG/5ML IJ SOLN
INTRAMUSCULAR | Status: DC | PRN
  Administered 2019-05-18 (×2): 2 mg via INTRAVENOUS

## 2019-05-18 MED ORDER — MEPERIDINE HCL 100 MG/ML IJ SOLN
INTRAMUSCULAR | Status: AC
  Filled 2019-05-18: qty 2

## 2019-05-18 MED ORDER — SODIUM CHLORIDE 0.9 % IV SOLN: INTRAVENOUS | Status: DC

## 2019-05-18 MED ORDER — STERILE WATER FOR IRRIGATION IR SOLN
Status: DC | PRN
  Administered 2019-05-18: 2.5 mL

## 2019-05-18 MED ORDER — MIDAZOLAM HCL 5 MG/5ML IJ SOLN
INTRAMUSCULAR | Status: AC
  Filled 2019-05-18: qty 10

## 2019-05-18 NOTE — Discharge Instructions (Signed)
You had 1 small polyp removed. You have  SMALL external hemorrhoids.   DRINK WATER TO KEEP YOUR URINE LIGHT YELLOW.  FOLLOW A HIGH FIBER DIET. AVOID ITEMS THAT CAUSE BLOATING & GAS. SEE INFO BELOW.  USE PREPARATION H FOUR TIMES  A DAY IF NEEDED TO RELIEVE RECTAL PAIN/PRESSURE/BLEEDING.   YOUR BIOPSY RESULTS WILL BE BACK IN 5 BUSINESS DAYS.  Next colonoscopy in 5-10 years.    Colonoscopy Care After Read the instructions outlined below and refer to this sheet in the next week. These discharge instructions provide you with general information on caring for yourself after you leave the hospital. While your treatment has been planned according to the most current medical practices available, unavoidable complications occasionally occur. If you have any problems or questions after discharge, call DR. Early Steel, [phone].  ACTIVITY  You may resume your regular activity, but move at a slower pace for the next 24 hours.   Take frequent rest periods for the next 24 hours.   Walking will help get rid of the air and reduce the bloated feeling in your belly (abdomen).   No driving for 24 hours (because of the medicine (anesthesia) used during the test).   You may shower.   Do not sign any important legal documents or operate any machinery for 24 hours (because of the anesthesia used during the test).    NUTRITION  Drink plenty of fluids.   You may resume your normal diet as instructed by your doctor.   Begin with a light meal and progress to your normal diet. Heavy or fried foods are harder to digest and may make you feel sick to your stomach (nauseated).   Avoid alcoholic beverages for 24 hours or as instructed.    MEDICATIONS  You may resume your normal medications.   WHAT YOU CAN EXPECT TODAY  Some feelings of bloating in the abdomen.   Passage of more gas than usual.   Spotting of blood in your stool or on the toilet paper  .  IF YOU HAD POLYPS REMOVED DURING THE  COLONOSCOPY:  Eat a soft diet IF YOU HAVE NAUSEA, BLOATING, ABDOMINAL PAIN, OR VOMITING.    FINDING OUT THE RESULTS OF YOUR TEST Not all test results are available during your visit. DR. Oneida Alar WILL CALL YOU WITHIN 14 DAYS OF YOUR PROCEDUE WITH YOUR RESULTS. Do not assume everything is normal if you have not heard from DR. Tian Davison, CALL HER OFFICE AT [phone].  SEEK IMMEDIATE MEDICAL ATTENTION AND CALL THE OFFICE: [phone] IF:  You have more than a spotting of blood in your stool.   Your belly is swollen (abdominal distention).   You are nauseated or vomiting.   You have a temperature over 101F.   You have abdominal pain or discomfort that is severe or gets worse throughout the day.   High-Fiber Diet A high-fiber diet changes your normal diet to include more whole grains, legumes, fruits, and vegetables. Changes in the diet involve replacing refined carbohydrates with unrefined foods. The calorie level of the diet is essentially unchanged. The Dietary Reference Intake (recommended amount) for adult males is 38 grams per day. For adult females, it is 25 grams per day. Pregnant and lactating women should consume 28 grams of fiber per day. Fiber is the intact part of a plant that is not broken down during digestion. Functional fiber is fiber that has been isolated from the plant to provide a beneficial effect in the body.  PURPOSE Increase stool bulk.  Ease and regulate bowel movements.  Lower cholesterol.  REDUCE RISK OF COLON CANCER  INDICATIONS THAT YOU NEED MORE FIBER Constipation and hemorrhoids.  Uncomplicated diverticulosis (intestine condition) and irritable bowel syndrome.  Weight management.  As a protective measure against hardening of the arteries (atherosclerosis), diabetes, and cancer.   GUIDELINES FOR INCREASING FIBER IN THE DIET Start adding fiber to the diet slowly. A gradual increase of about 5 more grams (2 servings of most fruits or vegetables) per day is  best. Too rapid an increase in fiber may result in constipation, flatulence, and bloating.  Drink enough water and fluids to keep your urine clear or pale yellow. Water, juice, or caffeine-free drinks are recommended. Not drinking enough fluid may cause constipation.  Eat a variety of high-fiber foods rather than one type of fiber.  Try to increase your intake of fiber through using high-fiber foods rather than fiber pills or supplements that contain small amounts of fiber.  The goal is to change the types of food eaten. Do not supplement your present diet with high-fiber foods, but replace foods in your present diet.    Polyps, Colon  A polyp is extra tissue that grows inside your body. Colon polyps grow in the large intestine. The large intestine, also called the colon, is part of your digestive system. It is a long, hollow tube at the end of your digestive tract where your body makes and stores stool. Most polyps are not dangerous. They are benign. This means they are not cancerous. But over time, some types of polyps can turn into cancer. Polyps that are smaller than a pea are usually not harmful. But larger polyps could someday become or may already be cancerous. To be safe, doctors remove all polyps and test them.   WHO GETS POLYPS? Anyone can get polyps, but certain people are more likely than others. You may have a greater chance of getting polyps if:  You are over 50.   You have had polyps before.   Someone in your family has had polyps.   Someone in your family has had cancer of the large intestine.   Find out if someone in your family has had polyps. You may also be more likely to get polyps if you:   Eat a lot of fatty foods   Smoke   Drink alcohol   Do not exercise  Eat too much   PREVENTION There is not one sure way to prevent polyps. You might be able to lower your risk of getting them if you:  Eat more fruits and vegetables and less fatty food.   Do not smoke.    Avoid alcohol.   Exercise every day.   Lose weight if you are overweight.   Eating more calcium and folate can also lower your risk of getting polyps. Some foods that are rich in calcium are milk, cheese, and broccoli. Some foods that are rich in folate are chickpeas, kidney beans, and spinach.

## 2019-05-18 NOTE — H&P (Signed)
Primary Care Physician:  Patient, No Pcp Per Primary Gastroenterologist:  Dr. Oneida Alar  Pre-Procedure History & Physical: HPI:  Terri Newman is a 59 y.o. female here for  PERSONAL HISTORY OF POLYPS.  Past Medical History:  Diagnosis Date  . Adenomatous polyp of colon 08/2013   TWO, REDUNDANT LEFT COLON  . Helicobacter pylori gastritis JUL 2015   ABO  BID FOR 10 DAYS  . Sarcoidosis   . Sarcoidosis     Past Surgical History:  Procedure Laterality Date  . APPENDECTOMY    . CHOLECYSTECTOMY  Nov 2014   [facility]  . COLONOSCOPY N/A 08/29/2013   SLF: Several simple adenomas removed, moderate sized internal hemorrhoids, slightly redundant left colon.  . ESOPHAGOGASTRODUODENOSCOPY N/A 08/29/2013   SLF: H. pylori gastritis treated with amoxicillin/Biaxin/omeprazole  . NECK SURGERY    . TUBAL LIGATION      Prior to Admission medications   Medication Sig Start Date End Date Taking? Authorizing Provider  [id number] MG/5ML SYRP Take 5 mLs by mouth daily.   Yes [provider]  ibuprofen (ADVIL) 600 MG tablet Take 1 tablet (600 mg total) by mouth every 6 (six) hours as needed. Patient taking differently: Take 600 mg by mouth every 6 (six) hours as needed for moderate pain.  02/05/19  Yes Rolland Porter, MD  pantoprazole (PROTONIX) 40 MG tablet Take 40 mg by mouth every other day.  01/07/19  Yes [provider]    Allergies as of 03/06/2019  . (No Known Allergies)    Family History  Problem Relation Age of Onset  . Colon cancer Neg Hx     Social History   Socioeconomic History  . Marital status: Married    Spouse name: Not on file  . Number of children: Not on file  . Years of education: Not on file  . Highest education level: Not on file  Occupational History  . Occupation: caregiver  Tobacco Use  . Smoking status: Never Smoker  . Smokeless tobacco: Never Used  Substance and Sexual Activity  . Alcohol use: No  . Drug use: No  . Sexual activity: Not on file   Other Topics Concern  . Not on file  Social History Narrative  . Not on file   Social Determinants of Health   Financial Resource Strain:   . Difficulty of Paying Living Expenses:   Food Insecurity:   . Worried About Charity fundraiser in the Last Year:   . Arboriculturist in the Last Year:   Transportation Needs:   . Film/video editor (Medical):   Marland Kitchen Lack of Transportation (Non-Medical):   Physical Activity:   . Days of Exercise per Week:   . Minutes of Exercise per Session:   Stress:   . Feeling of Stress :   Social Connections:   . Frequency of Communication with Friends and Family:   . Frequency of Social Gatherings with Friends and Family:   . Attends Religious Services:   . Active Member of Clubs or Organizations:   . Attends Archivist Meetings:   Marland Kitchen Marital Status:   Intimate Partner Violence:   . Fear of Current or Ex-Partner:   . Emotionally Abused:   Marland Kitchen Physically Abused:   . Sexually Abused:     Review of Systems: See HPI, otherwise negative ROS   Physical Exam: BP 111/72   Pulse 63   Temp 97.9 F (36.6 C) (Oral)   Resp 12   Ht 5' (  1.524 m)   SpO2 99%   BMI 26.99 kg/m  General:   Alert,  pleasant and cooperative in NAD Head:  Normocephalic and atraumatic. Neck:  Supple; Lungs:  Clear throughout to auscultation.    Heart:  Regular rate and rhythm. Abdomen:  Soft, nontender and nondistended. Normal bowel sounds, without guarding, and without rebound.   Neurologic:  Alert and  oriented x4;  grossly normal neurologically.  Impression/Plan:      PERSONAL HISTORY OF POLYPS.  PLAN: 1. TCS TODAY. DISCUSSED PROCEDURE, BENEFITS, & RISKS: < 1% chance of medication reaction, bleeding, perforation, ASPIRATION, or rupture of spleen/liver requiring surgery to fix it and missed polyps < 1 cm 10-20% of the time.

## 2019-05-18 NOTE — Op Note (Signed)
[facility] Patient Name: Terri Newman Procedure Date: 05/18/2019 8:36 AM MRN: [id number] Date of Birth: [birth year] Attending MD: Barney Drain MD, MD CSN: [id number] Age: 59 Admit Type: Outpatient Procedure:                Colonoscopy WITH COLD SNARE POLYPECTOMY Indications:              Personal history of colonic polyps Providers:                Barney Drain MD, MD, Lurline Del, RN, Nelma Rothman,                            Technician Referring MD:             Jasper Loser. Luan Pulling MD, MD Medicines:                Meperidine 50 mg IV, Midazolam 4 mg IV Complications:            No immediate complications. Estimated Blood Loss:     Estimated blood loss was minimal. Procedure:                Pre-Anesthesia Assessment:                           - Prior to the procedure, a History and Physical                            was performed, and patient medications and                            allergies were reviewed. The patient's tolerance of                            previous anesthesia was also reviewed. The risks                            and benefits of the procedure and the sedation                            options and risks were discussed with the patient.                            All questions were answered, and informed consent                            was obtained. Prior Anticoagulants: The patient has                            taken no previous anticoagulant or antiplatelet                            agents except for NSAID medication. ASA Grade                            Assessment: II - A patient with mild systemic  disease. After reviewing the risks and benefits,                            the patient was deemed in satisfactory condition to                            undergo the procedure. After obtaining informed                            consent, the colonoscope was passed under direct                            vision. Throughout the  procedure, the patient's                            blood pressure, pulse, and oxygen saturations were                            monitored continuously. The [id number]-[id number]                            scope was introduced through the anus and advanced                            to the the cecum, identified by appendiceal orifice                            and ileocecal valve. The colonoscopy was somewhat                            difficult due to a tortuous colon and the patient's                            discomfort during the procedure. Successful                            completion of the procedure was aided by                            straightening and shortening the scope to obtain                            bowel loop reduction and COLOWRAP. The patient                            tolerated the procedure fairly well. The quality of                            the bowel preparation was excellent. The ileocecal                            valve, appendiceal orifice, and rectum were  photographed. Scope In: 8:53:11 AM Scope Out: 9:09:22 AM Scope Withdrawal Time: 0 hours 12 minutes 40 seconds  Total Procedure Duration: 0 hours 16 minutes 11 seconds  Findings:      Two sessile polyps were found in the proximal ascending colon and distal       ascending colon. The polyps were 5 to 8 mm in size. These polyps were       removed with a cold snare. Resection and retrieval were complete.      External hemorrhoids were found. The hemorrhoids were small.      The recto-sigmoid colon and sigmoid colon were mildly tortuous. Impression:               - Two 5 to 8 mm polyps in the proximal ascending                            colon and in the distal ascending colon, removed                            with a cold snare. Resected and retrieved.                           - External hemorrhoids.                           - Tortuous LEFT colon. Moderate Sedation:       Moderate (conscious) sedation was administered by the endoscopy nurse       and supervised by the endoscopist. The following parameters were       monitored: oxygen saturation, heart rate, blood pressure, and response       to care. Total physician intraservice time was 26 minutes. Recommendation:           - Patient has a contact number available for                            emergencies. The signs and symptoms of potential                            delayed complications were discussed with the                            patient. Return to normal activities tomorrow.                            Written discharge instructions were provided to the                            patient.                           - High fiber diet.                           - Continue present medications.                           - Await pathology results.                           -  Repeat colonoscopy in 5-10 years for surveillance. Procedure Code(s):        --- Professional ---                           [phone], Colonoscopy, flexible; with removal of                            tumor(s), polyp(s), or other lesion(s) by snare                            technique                           99153, Moderate sedation; each additional 15                            minutes intraservice time                           G0500, Moderate sedation services provided by the                            same physician or other qualified health care                            professional performing a gastrointestinal                            endoscopic service that sedation supports,                            requiring the presence of an independent trained                            observer to assist in the monitoring of the                            patient's level of consciousness and physiological                            status; initial 15 minutes of intra-service time;                            patient age 29 years  or older (additional time may                            be reported with [phone], as appropriate) Diagnosis Code(s):        --- Professional ---                           K63.5, Polyp of colon                           K64.4, Residual hemorrhoidal skin tags  Z86.010, Personal history of colonic polyps                           Q43.8, Other specified congenital malformations of                            intestine CPT copyright 2019 American Medical Association. All rights reserved. The codes documented in this report are preliminary and upon coder review may  be revised to meet current compliance requirements. Barney Drain, MD Barney Drain MD, MD 05/18/2019 9:21:16 AM This report has been signed electronically. Number of Addenda: 0

## 2019-05-21 LAB — SURGICAL PATHOLOGY

## 2019-06-22 ENCOUNTER — Telehealth: Payer: Self-pay | Admitting: Emergency Medicine

## 2019-06-22 MED ORDER — PANTOPRAZOLE SODIUM 40 MG PO TBEC: 40.0000 mg | DELAYED_RELEASE_TABLET | Freq: Every day | ORAL | 3 refills | Status: DC

## 2019-06-22 NOTE — Telephone Encounter (Signed)
Pt.notified

## 2019-06-22 NOTE — Telephone Encounter (Signed)
Pt wants to know if we can send her in pantoprazole 40mg  tabs . We have never ordered it her pcp doctor hawkins usually did but he retired in [place] and she  was wonder if someone here could. If so please send into [facility] in [place].pt was last seen in this office in [place]

## 2019-06-22 NOTE — Telephone Encounter (Signed)
Yes - RX sent.

## 2019-06-22 NOTE — Addendum Note (Signed)
Addended by: Mahala Menghini on: 06/22/2019 10:54 AM   Modules accepted: Orders

## 2019-08-16 LAB — HM PAP SMEAR: HM Pap smear: NORMAL

## 2019-08-25 NOTE — Progress Notes (Signed)
Referring Provider: No ref. provider found Primary Care Physician:  Patient, No Pcp Per Primary GI Physician: Dr. Oneida Alar; Dr. Gala Romney following for now; will establish with Dr. Abbey Chatters.  Chief Complaint  Patient presents with  . Abdominal Pain    mid upper abd    HPI:   Terri Newman is a 59 y.o. female presenting today for follow-up of GERD, upper abdominal pain, and history of adenomatous colon polyps s/p TCS.  History of H. pylori gastritis in 2015 treated with amoxicillin/Biaxin/omeprazole. History of cholecystectomy.   Patient was last seen in our office 02/28/2019.  Reported several year history of rare severe epigastric pain occurring 1-2 times per year lasting 30 to 45 minutes at a time.  Most recent episode occurred off pantoprazole.  No typical heartburn symptoms.  Planned to continue to monitor for now.  Recommended obtaining labs 6-8 hours after next episode (CBC, CMP, lipase).  Advised that she could hold pantoprazole for several weeks to see if she had any recurrent symptoms.  She was scheduled for surveillance colonoscopy as she was overdue.  Labs not completed. TCS 05/18/2019: Two 5-8 mm polyps in the proximal ascending and distal ascending colon, external hemorrhoids, tortuous left colon.  Pathology with sessile serrated polyps.  Recommended next colonoscopy in 5 years.  Advise sisters, brothers, children, and parents should have colonoscopy starting at age 43.  Today: Abdominal Pain: Epigastric pain, will radiate across the upper abdomen at times. Feels like a tightness in the epigastric area. Occurs daily.  Daily symptoms started about 1 month ago.  About 6/10 in severity. Last 30-45 minutes at a time. Bloating after eating. Present for a while. Worsening. Bloating worsens epigastric pain, but pain isn't always postprandial. No severe episode since she was seen last. No nausea or vomiting.  No fried foods. No known triggers of bloating. Some green leafy vegetables and  broccoli but not daily. No soda.     Had been taking Protonix every other day. Stated taking daily for the last 1-2 weeks. No improvement in pain. No GERD symptom when she is taking Protonix. She has stopped taking Protonix for a couple weeks at a time and notices reflux symptoms return. No dysaphia.   BMs daily. No constipation. No diarrhea. No blood in the stool or black stool.   Ibuprofen about once a month. Will worsen her upper abdominal pain. No alcohol. Alcohol will also worsen upper abdominal pain.  Weight is stable.    Past Medical History:  Diagnosis Date  . Adenomatous polyp of colon 08/2013   TWO, REDUNDANT LEFT COLON  . Helicobacter pylori gastritis JUL 2015   ABO  BID FOR 10 DAYS  . Sarcoidosis   . Sarcoidosis     Past Surgical History:  Procedure Laterality Date  . APPENDECTOMY    . CHOLECYSTECTOMY  Nov 2014   [facility]  . COLONOSCOPY N/A 08/29/2013   [facility]: Several simple adenomas removed, moderate sized internal hemorrhoids, slightly redundant left colon.  . COLONOSCOPY N/A 05/18/2019   Procedure: COLONOSCOPY;  Surgeon: Danie Binder, MD;   Two 5-8 mm polyps in the proximal ascending and distal ascending colon, external hemorrhoids, tortuous left colon.  Pathology with sessile serrated polyps.  Recommended next colonoscopy in 5 years.    . ESOPHAGOGASTRODUODENOSCOPY N/A 08/29/2013   [facility]: H. pylori gastritis treated with amoxicillin/Biaxin/omeprazole  . NECK SURGERY    . POLYPECTOMY  05/18/2019   Procedure: POLYPECTOMY;  Surgeon: Danie Binder, MD;  Location: [facility];  Service: Endoscopy;;  ascending x2  . TUBAL LIGATION      Current Outpatient Medications  Medication Sig Dispense Refill  . ibuprofen (ADVIL) 600 MG tablet Take 1 tablet (600 mg total) by mouth every 6 (six) hours as needed. (Patient taking differently: Take 600 mg by mouth every 6 (six) hours as needed for moderate pain. ) 40 tablet 0  . pantoprazole (PROTONIX) 40 MG tablet Take 1 tablet (40 mg  total) by mouth daily before breakfast. 90 tablet 3   No current facility-administered medications for this visit.    Allergies as of 08/27/2019  . (No Known Allergies)    Family History  Problem Relation Age of Onset  . Colon cancer Neg Hx     Social History   Socioeconomic History  . Marital status: Married    Spouse name: Not on file  . Number of children: Not on file  . Years of education: Not on file  . Highest education level: Not on file  Occupational History  . Occupation: caregiver  Tobacco Use  . Smoking status: Never Smoker  . Smokeless tobacco: Never Used  Substance and Sexual Activity  . Alcohol use: No  . Drug use: No  . Sexual activity: Not on file  Other Topics Concern  . Not on file  Social History Narrative  . Not on file   Social Determinants of Health   Financial Resource Strain:   . Difficulty of Paying Living Expenses:   Food Insecurity:   . Worried About Charity fundraiser in the Last Year:   . Arboriculturist in the Last Year:   Transportation Needs:   . Film/video editor (Medical):   Marland Kitchen Lack of Transportation (Non-Medical):   Physical Activity:   . Days of Exercise per Week:   . Minutes of Exercise per Session:   Stress:   . Feeling of Stress :   Social Connections:   . Frequency of Communication with Friends and Family:   . Frequency of Social Gatherings with Friends and Family:   . Attends Religious Services:   . Active Member of Clubs or Organizations:   . Attends Archivist Meetings:   Marland Kitchen Marital Status:     Review of Systems: Gen: Denies fever, chills, cold or flulike symptoms, lightheadedness, dizziness, presyncope, syncope. CV: Denies chest pain or palpitations. Resp: Some SOB with exertion. Occasional cough. Has sarcoidosis.  GI: See HPI Heme: See HPI  Physical Exam: BP 111/74   Pulse 67   Temp (!) 97.2 F (36.2 C) (Oral)   Ht 5' (1.524 m)   Wt 140 lb 9.6 oz (63.8 kg)   BMI 27.46 kg/m  General:    Alert and oriented. No distress noted. Pleasant and cooperative.  Head:  Normocephalic and atraumatic. Eyes:  Conjuctiva clear without scleral icterus. Heart:  S1, S2 present without murmurs appreciated. Lungs:  Clear to auscultation bilaterally. No wheezes, rales, or rhonchi. No distress.  Abdomen:  +BS, soft, and non-distended. Very mild tenderness to palpation in the epigastric area and right upper quadrant. No rebound or guarding. No HSM or masses noted. Msk:  Symmetrical without gross deformities. Normal posture. Extremities:  Without edema. Neurologic:  Alert and  oriented x4 Psych:  Normal mood and affect.

## 2019-08-27 ENCOUNTER — Other Ambulatory Visit: Payer: Self-pay

## 2019-08-27 ENCOUNTER — Encounter: Payer: Self-pay | Admitting: Gastroenterology

## 2019-08-27 ENCOUNTER — Ambulatory Visit (INDEPENDENT_AMBULATORY_CARE_PROVIDER_SITE_OTHER): Admitting: Gastroenterology

## 2019-08-27 VITALS — BP 111/74 | HR 67 | Temp 97.2°F | Ht 60.0 in | Wt 140.6 lb

## 2019-08-27 DIAGNOSIS — R1013 Epigastric pain: Secondary | ICD-10-CM

## 2019-08-27 DIAGNOSIS — R140 Abdominal distension (gaseous): Secondary | ICD-10-CM | POA: Diagnosis not present

## 2019-08-27 DIAGNOSIS — Z8619 Personal history of other infectious and parasitic diseases: Secondary | ICD-10-CM

## 2019-08-27 DIAGNOSIS — K219 Gastro-esophageal reflux disease without esophagitis: Secondary | ICD-10-CM | POA: Diagnosis not present

## 2019-08-27 NOTE — Patient Instructions (Addendum)
Please stop Protonix and avoid all other acid suppressing medications, then have labs completed in 2 weeks.  This should be on August 10 or thereafter.  We will be checking for eradication of H. pylori at this time as well.  Follow a GERD diet/lifestyle:  Avoid fried, fatty, greasy, spicy, citrus foods. Avoid caffeine and carbonated beverages. Avoid chocolate. Try eating 4-6 small meals a day rather than 3 large meals. Do not eat within 3 hours of laying down. Prop head of bed up on wood or bricks to create a 6 inch incline.  Avoid all NSAIDs including ibuprofen, Aleve, Advil, Goody powders, naproxen, and anything that says "NSAID" on the package.  Avoid items that cause bloating including broccoli, cauliflower, cabbage, Brussels sprouts, beans, artificial sweeteners, carbonated beverages, and drinking through a straw.  Add daily probiotic.  Hardin Negus colon health, Align, Digestive Advantage are good options.   Further recommendations to follow lab results.   Plan to follow-up in 3 months.   [name], PA-C [facility]     Food Choices for Gastroesophageal Reflux Disease, Adult When you have gastroesophageal reflux disease (GERD), the foods you eat and your eating habits are very important. Choosing the right foods can help ease the discomfort of GERD. Consider working with a diet and nutrition specialist (dietitian) to help you make healthy food choices. What general guidelines should I follow?  Eating plan  Choose healthy foods low in fat, such as fruits, vegetables, whole grains, low-fat dairy products, and lean meat, fish, and poultry.  Eat frequent, small meals instead of three large meals each day. Eat your meals slowly, in a relaxed setting. Avoid bending over or lying down until 2-3 hours after eating.  Limit high-fat foods such as fatty meats or fried foods.  Limit your intake of oils, butter, and shortening to less than 8 teaspoons each  day.  Avoid the following: ? Foods that cause symptoms. These may be different for different people. Keep a food diary to keep track of foods that cause symptoms. ? Alcohol. ? Drinking large amounts of liquid with meals. ? Eating meals during the 2-3 hours before bed.  Cook foods using methods other than frying. This may include baking, grilling, or broiling. Lifestyle  Maintain a healthy weight. Ask your health care provider what weight is healthy for you. If you need to lose weight, work with your health care provider to do so safely.  Exercise for at least 30 minutes on 5 or more days each week, or as told by your health care provider.  Avoid wearing clothes that fit tightly around your waist and chest.  Do not use any products that contain nicotine or tobacco, such as cigarettes and e-cigarettes. If you need help quitting, ask your health care provider.  Sleep with the head of your bed raised. Use a wedge under the mattress or blocks under the bed frame to raise the head of the bed. What foods are not recommended? The items listed may not be a complete list. Talk with your dietitian about what dietary choices are best for you. Grains Pastries or quick breads with added fat. [place] toast. Vegetables Deep fried vegetables. [place] fries. Any vegetables prepared with added fat. Any vegetables that cause symptoms. For some people this may include tomatoes and tomato products, chili peppers, onions and garlic, and horseradish. Fruits Any fruits prepared with added fat. Any fruits that cause symptoms. For some people this may include citrus fruits, such as oranges, grapefruit, pineapple, and  lemons. Meats and other protein foods High-fat meats, such as fatty beef or pork, hot dogs, ribs, ham, sausage, salami and bacon. Fried meat or protein, including fried fish and fried chicken. Nuts and nut butters. Dairy Whole milk and chocolate milk. Sour cream. Cream. Ice cream. Cream cheese. Milk  shakes. Beverages Coffee and tea, with or without caffeine. Carbonated beverages. Sodas. Energy drinks. Fruit juice made with acidic fruits (such as orange or grapefruit). Tomato juice. Alcoholic drinks. Fats and oils Butter. Margarine. Shortening. Ghee. Sweets and desserts Chocolate and cocoa. Donuts. Seasoning and other foods Pepper. Peppermint and spearmint. Any condiments, herbs, or seasonings that cause symptoms. For some people, this may include curry, hot sauce, or vinegar-based salad dressings. Summary  When you have gastroesophageal reflux disease (GERD), food and lifestyle choices are very important to help ease the discomfort of GERD.  Eat frequent, small meals instead of three large meals each day. Eat your meals slowly, in a relaxed setting. Avoid bending over or lying down until 2-3 hours after eating.  Limit high-fat foods such as fatty meat or fried foods. This information is not intended to replace advice given to you by your health care provider. Make sure you discuss any questions you have with your health care provider. Document Revised: 05/11/2018 Document Reviewed: 01/20/2016 Elsevier Patient Education  [place].  Abdominal Bloating When you have abdominal bloating, your abdomen may feel full, tight, or painful. It may also look bigger than normal or swollen (distended). Common causes of abdominal bloating include:  Swallowing air.  Constipation.  Problems digesting food.  Eating too much.  Irritable bowel syndrome. This is a condition that affects the large intestine.  Lactose intolerance. This is an inability to digest lactose, a natural sugar in dairy products.  Celiac disease. This is a condition that affects the ability to digest gluten, a protein found in some grains.  Gastroparesis. This is a condition that slows down the movement of food in the stomach and small intestine. It is more common in people with diabetes  mellitus.  Gastroesophageal reflux disease (GERD). This is a digestive condition that makes stomach acid flow back into the esophagus.  Urinary retention. This means that the body is holding onto urine, and the bladder cannot be emptied all the way. Follow these instructions at home: Eating and drinking  Avoid eating too much.  Try not to swallow air while talking or eating.  Avoid eating while lying down.  Avoid these foods and drinks: ? Foods that cause gas, such as broccoli, cabbage, cauliflower, and baked beans. ? Carbonated drinks. ? Hard candy. ? Chewing gum. Medicines  Take over-the-counter and prescription medicines only as told by your health care provider.  Take probiotic medicines. These medicines contain live bacteria or yeasts that can help digestion.  Take coated peppermint oil capsules. Activity  Try to exercise regularly. Exercise may help to relieve bloating that is caused by gas and relieve constipation. General instructions  Keep all follow-up visits as told by your health care provider. This is important. Contact a health care provider if:  You have nausea and vomiting.  You have diarrhea.  You have abdominal pain.  You have unusual weight loss or weight gain.  You have severe pain, and medicines do not help. Get help right away if:  You have severe chest pain.  You have trouble breathing.  You have shortness of breath.  You have trouble urinating.  You have darker urine than normal.  You have  blood in your stools or have dark, tarry stools. Summary  Abdominal bloating means that the abdomen is swollen.  Common causes of abdominal bloating are swallowing air, constipation, and problems digesting food.  Avoid eating too much and avoid swallowing air.  Avoid foods that cause gas, carbonated drinks, hard candy, and chewing gum. This information is not intended to replace advice given to you by your health care provider. Make sure you  discuss any questions you have with your health care provider. Document Revised: 05/08/2018 Document Reviewed: 02/20/2016 Elsevier Patient Education  [place].

## 2019-08-27 NOTE — Progress Notes (Signed)
No pcp per patient 

## 2019-08-27 NOTE — Progress Notes (Signed)
Opened in error

## 2019-08-27 NOTE — Assessment & Plan Note (Addendum)
Chronic history of dyspepsia. Symptoms have been mild and intermittent with rare severe epigastric pain. Over the last 1 month, epigastric pain has been occurring daily without specific triggers. Also with bloating after meals which worsens abdominal pain. Symptoms typically last 30-45 minutes. History of GERD but has not been taking Protonix daily until 1-2 weeks ago. No significant improvement in epigastric pain as of yet. Denies nausea, vomiting, BRBPR, melena, or any regular NSAID use. Notably, ibuprofen will worsen epigastric pain. No alcohol as this will also worsen epigastric pain. History of cholecystectomy. EGD in 2015 with H. pylori gastritis s/p treatment but no documented eradication.  Most suspicious for gastritis with possible esophagitis or duodenitis. She could also have unresolved H. pylori infection or PUD. Celiac disease also on the differential considering postprandial bloating. Less likely pancreatitis or biliary etiology.  Plan: Hold Protonix and all other acid suppression medications for 2 weeks then complete labs including CBC, CMP, lipase, TTG IgA, IgA total, and H. pylori breath test. Advised to follow a GERD diet/lifestyle. Counseled on this. Handout provided. Avoid all NSAIDs. Avoid items that cause bloating including broccoli, cauliflower, cabbage, Brussels sprouts, beans, artificial sweeteners, carbonated beverages, and drinking through a straw. Add daily probiotic.  Hardin Negus colon health, Align, Digestive Advantage are good options.  Further recommendations to follow lab results. Consider trial of Protonix to 40 mg BID if H. pylori is negative. Plan to follow-up in 3 months.

## 2019-08-27 NOTE — Assessment & Plan Note (Addendum)
Postprandial abdominal bloating. Symptoms worsen her chronic epigastric pain. Denies constipation, diarrhea, bright red blood per rectum, melena, nausea, or vomiting. No regular NSAID use. History of cholecystectomy. History of H. pylori gastritis in 2015 s/p treatment but no documented eradication. She does eat green leafy vegetables and broccoli but not daily.  Etiology of bloating is not clear. Could be dietary related. Also plan to evaluate for H. pylori eradication and celiac disease. SIBO also in the differential.   Plan: Hold Protonix and all other acid suppression medications for 2 weeks then complete TTG IgA, IgA total, and H. pylori breath test. Avoid items that cause bloating including broccoli, cauliflower, cabbage, Brussels sprouts, beans, artificial sweeteners, carbonated beverages, and drinking through a straw. Add daily probiotic.  Hardin Negus colon health, Align, Digestive Advantage are good options.  Further recommendations to follow lab results.  Plan to follow-up in 3 months.

## 2019-08-27 NOTE — Assessment & Plan Note (Addendum)
GERD symptoms are well controlled when she takes Protonix 40 mg. Typically, patient does not take medication daily. Due epigastric pain becoming more frequent, she has resumed taking Protonix 40 mg daily over the last 1-2 weeks. She continues with dyspepsia as discussed below. Denies dysphagia, BRBPR, melena, or regular NSAID use. History of H. pylori gastritis in 2015 s/p treatment but no documented eradication.  Advised to hold Protonix and all other acid suppression medications for 2 weeks then complete H. pylori breath test. Advised to follow GERD diet/lifestyle. Counseled on this. Handout provided. Advise she avoid all NSAIDs. Further recommendations to follow. If H. pylori is negative, will likely increase Protonix to twice daily to see if this improves dyspepsia. Follow-up in 3 months.

## 2019-09-02 DIAGNOSIS — A048 Other specified bacterial intestinal infections: Secondary | ICD-10-CM

## 2019-09-02 HISTORY — DX: Other specified bacterial intestinal infections: A04.8

## 2019-09-03 ENCOUNTER — Telehealth: Payer: Self-pay | Admitting: Emergency Medicine

## 2019-09-03 NOTE — Telephone Encounter (Signed)
Pt called and stated she has an ov with [place] on 8/10 and wanted to let me know she will be going to the lab on 8/6 to have her labs drawn

## 2019-09-07 LAB — CBC WITH DIFFERENTIAL/PLATELET
Absolute Monocytes: 328 cells/uL (ref 200–950)
Basophils Absolute: 21 cells/uL (ref 0–200)
Basophils Relative: 0.4 %
Eosinophils Absolute: 62 cells/uL (ref 15–500)
Eosinophils Relative: 1.2 %
HCT: 38.5 % (ref 35.0–45.0)
Hemoglobin: 12.6 g/dL (ref 11.7–15.5)
Lymphs Abs: 2330 cells/uL (ref 850–3900)
MCH: 29 pg (ref 27.0–33.0)
MCHC: 32.7 g/dL (ref 32.0–36.0)
MCV: 88.7 fL (ref 80.0–100.0)
MPV: 10.9 fL (ref 7.5–12.5)
Monocytes Relative: 6.3 %
Neutro Abs: 2460 cells/uL (ref 1500–7800)
Neutrophils Relative %: 47.3 %
Platelets: 225 10*3/uL (ref 140–400)
RBC: 4.34 10*6/uL (ref 3.80–5.10)
RDW: 12.9 % (ref 11.0–15.0)
Total Lymphocyte: 44.8 %
WBC: 5.2 10*3/uL (ref 3.8–10.8)

## 2019-09-09 NOTE — Progress Notes (Signed)
WBC, hemoglobin, electrolytes, kidney function, and lipase all within normal limits.   Waiting on celiac serologies to result.   Additionally, at her last OV, I requested patient to stop Protonix and all other acid suppressing medications for 2 weeks then complete H. Pylori breath test. This should be 8/10 if she has been holding Protonix since her OV. Please remind patient of this.

## 2019-09-10 LAB — COMPLETE METABOLIC PANEL WITH GFR
AG Ratio: 1.3 (calc) (ref 1.0–2.5)
ALT: 9 U/L (ref 6–29)
AST: 15 U/L (ref 10–35)
Albumin: 4.2 g/dL (ref 3.6–5.1)
Alkaline phosphatase (APISO): 69 U/L (ref 37–153)
BUN: 14 mg/dL (ref 7–25)
CO2: 30 mmol/L (ref 20–32)
Calcium: 9.8 mg/dL (ref 8.6–10.4)
Chloride: 107 mmol/L (ref 98–110)
Creat: 0.8 mg/dL (ref 0.50–1.05)
GFR, Est African American: 94 mL/min/{1.73_m2} (ref >=60)
GFR, Est Non African American: 81 mL/min/{1.73_m2} (ref >=60)
Globulin: 3.3 g/dL (calc) (ref 1.9–3.7)
Glucose, Bld: 97 mg/dL (ref 65–99)
Potassium: 4.7 mmol/L (ref 3.5–5.3)
Sodium: 140 mmol/L (ref 135–146)
Total Bilirubin: 0.5 mg/dL (ref 0.2–1.2)
Total Protein: 7.5 g/dL (ref 6.1–8.1)

## 2019-09-10 LAB — TISSUE TRANSGLUTAMINASE, IGA: (tTG) Ab, IgA: 1 U/mL

## 2019-09-10 LAB — LIPASE: Lipase: 27 U/L (ref 7–60)

## 2019-09-10 LAB — H. PYLORI BREATH TEST: H. pylori Breath Test: DETECTED — AB

## 2019-09-10 LAB — IGA: Immunoglobulin A: 245 mg/dL (ref 47–310)

## 2019-09-11 ENCOUNTER — Institutional Professional Consult (permissible substitution): Admitting: Internal Medicine

## 2019-09-11 ENCOUNTER — Other Ambulatory Visit: Payer: Self-pay | Admitting: Gastroenterology

## 2019-09-11 DIAGNOSIS — A048 Other specified bacterial intestinal infections: Secondary | ICD-10-CM

## 2019-09-11 MED ORDER — PYLERA 140-125-125 MG PO CAPS: 3.0000 | ORAL_CAPSULE | Freq: Three times a day (TID) | ORAL | 0 refills | Status: DC

## 2019-09-11 NOTE — Progress Notes (Signed)
Celiac serologies are negative.   She is positive for H. Pylori. She was previously treated in 2015 with clarithromycin, amoxicillin, and omeprazole. We will need to treat with a different antibiotic regimen at this time.   I recommend treating with Pylera. Pylera is typically a 10 day course. I would like to try to treat for 14 days considering prior treatment failure. While on Pylera, she will need to be on Protonix 40 mg BID.  I am sending in a Rx for Pylera. She can let us know if she has any trouble obtaining medication.

## 2019-09-12 ENCOUNTER — Telehealth: Payer: Self-pay

## 2019-09-12 NOTE — Telephone Encounter (Signed)
Pt's pharmacy called about Pylera RX. They only can fill a 10 day supply and want to know if that will be ok. RX was written for 14 days. Please advise.

## 2019-09-13 ENCOUNTER — Other Ambulatory Visit: Payer: Self-pay | Admitting: Gastroenterology

## 2019-09-13 DIAGNOSIS — A048 Other specified bacterial intestinal infections: Secondary | ICD-10-CM

## 2019-09-13 MED ORDER — BISMUTH SUBSALICYLATE 262 MG PO CHEW: 524.0000 mg | CHEWABLE_TABLET | Freq: Four times a day (QID) | ORAL | 0 refills | Status: DC

## 2019-09-13 MED ORDER — TETRACYCLINE HCL 500 MG PO CAPS: 500.0000 mg | ORAL_CAPSULE | Freq: Four times a day (QID) | ORAL | 0 refills | Status: DC

## 2019-09-13 MED ORDER — METRONIDAZOLE 500 MG PO TABS: 500.0000 mg | ORAL_TABLET | Freq: Four times a day (QID) | ORAL | 0 refills | Status: DC

## 2019-09-13 NOTE — Telephone Encounter (Signed)
She needs Rx for 14 days due to failing treatment in the past. I will try to send in individual Rxs rather than the combination pill, Pylera.

## 2019-09-13 NOTE — Telephone Encounter (Signed)
Lmom, waiting on a return call.  

## 2019-09-13 NOTE — Telephone Encounter (Signed)
Spoke with pt and she would prefer to take the Pepto-Bismol tablet.

## 2019-09-13 NOTE — Telephone Encounter (Signed)
Can you call patient to see if she would prefer to take pepto-bismol in the liquid form or tablet form? This is part of the regimen I will be prescribing.

## 2019-09-13 NOTE — Telephone Encounter (Signed)
I have sent Rxs to her pharmacy.   Please let her know her treatment for H. Pylori will be:  Protonix 40 mg twice daily 30 minutes before breakfast and dinner x 14 days Bismuth subsalicylate 592 mg tablets - 2 chewable tablets (524 mg total) 4 times daily x 14 days Tetracycline 500 mg 4 times daily x 14 days.  Metronidazole 500 mg 4 times daily x 14 days.   Do not drink alcohol while taking medications.   After completing the 14 days of treatment, she will decrease Protonix back to once daily.   If she would like for [place] to mail the instructions, please do so. This is a lot to keep up with especially in multiple prescriptions.

## 2019-09-14 NOTE — Telephone Encounter (Signed)
Spoke with pt. Pt notified of Terri Merles, PA recommendations and pt was notified how to take medications.

## 2019-09-17 ENCOUNTER — Telehealth: Payer: Self-pay | Admitting: Internal Medicine

## 2019-09-17 NOTE — Telephone Encounter (Signed)
Pt doesn't know the name of the medication, but it's regarding H pylori. She said that [facility] on [address] in [place] hasn't received anything from [place] and she also said she was positive for covid and wanted to know would the medicine still work. Please advise. [phone]

## 2019-09-18 ENCOUNTER — Telehealth: Payer: Self-pay | Admitting: Internal Medicine

## 2019-09-18 NOTE — Telephone Encounter (Signed)
[facility]- spoke with Joellen Jersey, she said they have the rx's and have already spoken to the pt about them. The patient has already picked up part of the medication, She said they didn't have all of the tetracycline and had to order it but it came in and the patient has been informed to come pick it up. Joellen Jersey stated they have already spoken with her several times and they have also advised the pt on how to take the medications.   Cyril Mourning is it ok for her to take these if she is covid +?

## 2019-09-18 NOTE — Telephone Encounter (Signed)
Pt called back.  Informed her that she can hold off on starting medications until she is feeling better. Pt voiced understanding.

## 2019-09-18 NOTE — Telephone Encounter (Signed)
Tried to call the pharmacy to verify they received the rx's. They are not open yet, LMOM to return call and let us know that they have the rx's.

## 2019-09-18 NOTE — Telephone Encounter (Signed)
Pt following up on prescription. She had called yesterday. Please call [phone]

## 2019-09-18 NOTE — Telephone Encounter (Signed)
She can hold off on starting medications until she is feeling better.

## 2019-09-18 NOTE — Telephone Encounter (Signed)
Lmom for pt to call me back. 

## 2019-09-18 NOTE — Telephone Encounter (Signed)
I have talked to the pharmacy. See other phone note.

## 2019-09-18 NOTE — Telephone Encounter (Signed)
Angie, please let the pt know.

## 2019-10-04 DIAGNOSIS — G8929 Other chronic pain: Secondary | ICD-10-CM | POA: Insufficient documentation

## 2019-10-04 DIAGNOSIS — M542 Cervicalgia: Secondary | ICD-10-CM | POA: Insufficient documentation

## 2019-10-04 DIAGNOSIS — M5412 Radiculopathy, cervical region: Secondary | ICD-10-CM | POA: Insufficient documentation

## 2019-10-04 DIAGNOSIS — M545 Low back pain, unspecified: Secondary | ICD-10-CM | POA: Insufficient documentation

## 2019-10-05 ENCOUNTER — Other Ambulatory Visit: Payer: Self-pay | Admitting: Student

## 2019-10-05 DIAGNOSIS — M542 Cervicalgia: Secondary | ICD-10-CM

## 2019-10-11 NOTE — H&P (View-Only) (Signed)
Referring Provider: No ref. provider found Primary Care Physician:  Patient, No Pcp Per Primary GI Physician: Dr. Abbey Chatters  Chief Complaint  Patient presents with  . Abdominal Pain    HPI:   Terri Newman is a 59 y.o. female with history of GERD, dyspepsia, H. pylori in 2015 treated with amoxicillin/Biaxin/omeprazole, s/p cholecystectomy, and adenomatous colon polyps.  Colonoscopy up-to-date in April 2021 with 2 sessile serrated polyps and external hemorrhoids, due for repeat in 5 years.  She is presenting today for follow-up.  She was last seen in our office 08/27/19.  She noted daily epigastric pain/tightness radiating across the upper abdomen occurring daily.  Not necessarily postprandial.  Noted alcohol and ibuprofen worsen abdominal pain.  Previously would have severe episodes of pain about twice a year, but daily symptoms started about 1 month prior.  Symptoms lasted 30 to 45 minutes at a time and was 6/10 in severity.  Also with worsening bloating after eating.  She had starting Protonix daily for the last 1-2 weeks with no noticeable improvement in pain.  GERD symptoms are well controlled.  No constipation or diarrhea or overt GI bleeding.  Weight was stable.  Suspected gastritis, possible unresolved H. pylori infection or PUD, less likely pancreatic or biliary etiology.  Celiac disease and SIBO off as well differential with bloating.  Plan to hold acid suppression medication and complete labs including H. pylori breath test, avoid all NSAIDs, avoid gas producing items, add daily probiotic, further recommendations to follow.  Labs completed 09/07/2019.  CBC within normal limits, CMP within normal limits, lipase within normal limits, celiac serologies negative, H. pylori breath test positive.  Plans to treat with bismuth subsalicylate, tetracycline, metronidazole, and PPI twice daily x14 days.  Individual prescriptions sent to pharmacy on 8/11.  Patient called on 8/16 reporting she had  Covid.  She was advised she could hold off on taking medications until she was feeling better.  Today: Took antibiotics for H. Pylori.  Here improve exactly when she finished them.  States she went ahead and took them when she had Covid.  Continues with epigastric pain/tightness that radiates across upper abdomen. Occurring several days a week. Not worsened by food. No identified triggers. Any time of day. Has woken her up at night. No alcohol or NSAIDs. Last 30 -45 minutes. Occasional nausea but not necessarily associated with the pain. No vomiting. Still taking Protonix twice daily but doesn't feel anything has helped. No relieving factors identified. No GERD. No dysphaiga. Limiting intake because she is worried if she is full she may develop pain. Lost about 8 lbs in the last 1.5 months.   No trouble with constipation at this time. No diarrhea. Limiting vegetables and avoiding dairy but this hasn't helped with bloating. Hasn't tried adding probiotic. No brbpr or melena.   Recovered from [place]. Mild residual cough.  No shortness of breath.   Past Medical History:  Diagnosis Date  . Adenomatous polyp of colon 08/2013   TWO, REDUNDANT LEFT COLON  . H. pylori infection 09/2019   Treated with bismuth subsalicylate, tetracycline, metronidazole, and PPI twice daily x14 days.  . Helicobacter pylori gastritis 08/2013   ABO  BID FOR 10 DAYS  . Sarcoidosis   . Sarcoidosis     Past Surgical History:  Procedure Laterality Date  . APPENDECTOMY    . CHOLECYSTECTOMY  Nov 2014   [facility]  . COLONOSCOPY N/A 08/29/2013   SLF: Several simple adenomas removed, moderate sized internal hemorrhoids, slightly redundant  left colon.  . COLONOSCOPY N/A 05/18/2019   Procedure: COLONOSCOPY;  Surgeon: Danie Binder, MD;   Two 5-8 mm polyps in the proximal ascending and distal ascending colon, external hemorrhoids, tortuous left colon.  Pathology with sessile serrated polyps.  Recommended next colonoscopy in 5 years.     . ESOPHAGOGASTRODUODENOSCOPY N/A 08/29/2013   SLF: H. pylori gastritis treated with amoxicillin/Biaxin/omeprazole  . NECK SURGERY    . POLYPECTOMY  05/18/2019   Procedure: POLYPECTOMY;  Surgeon: Danie Binder, MD;  [facility];  Service: Endoscopy;;  ascending x2  . TUBAL LIGATION      Current Outpatient Medications  Medication Sig Dispense Refill  . acetaminophen (TYLENOL) 500 MG tablet Take 500 mg by mouth every 8 (eight) hours as needed for mild pain or headache.     . Misc Natural Products (ELDERBERRY ZINC/VIT C/IMMUNE MT) Use as directed 1 tablet in the mouth or throat daily. Gummie    . pantoprazole (PROTONIX) 40 MG tablet Take 1 tablet (40 mg total) by mouth daily before breakfast. 90 tablet 3  . Glycerin-Hypromellose-PEG 400 (DRY EYE RELIEF DROPS) 0.2-0.2-1 % SOLN Place 1 drop into both eyes daily as needed (Dry eye).     No current facility-administered medications for this visit.    Allergies as of 10/12/2019  . (No Known Allergies)    Family History  Problem Relation Age of Onset  . Colon cancer Neg Hx     Social History   Socioeconomic History  . Marital status: Married    Spouse name: Not on file  . Number of children: Not on file  . Years of education: Not on file  . Highest education level: Not on file  Occupational History  . Occupation: caregiver  Tobacco Use  . Smoking status: Never Smoker  . Smokeless tobacco: Never Used  Substance and Sexual Activity  . Alcohol use: No  . Drug use: No  . Sexual activity: Not on file  Other Topics Concern  . Not on file  Social History Narrative  . Not on file   Social Determinants of Health   Financial Resource Strain:   . Difficulty of Paying Living Expenses: Not on file  Food Insecurity:   . Worried About Charity fundraiser in the Last Year: Not on file  . Ran Out of Food in the Last Year: Not on file  Transportation Needs:   . Lack of Transportation (Medical): Not on file  . Lack of  Transportation (Non-Medical): Not on file  Physical Activity:   . Days of Exercise per Week: Not on file  . Minutes of Exercise per Session: Not on file  Stress:   . Feeling of Stress : Not on file  Social Connections:   . Frequency of Communication with Friends and Family: Not on file  . Frequency of Social Gatherings with Friends and Family: Not on file  . Attends Religious Services: Not on file  . Active Member of Clubs or Organizations: Not on file  . Attends Archivist Meetings: Not on file  . Marital Status: Not on file    Review of Systems: Gen: Denies fever, chills, cold or flulike symptoms, presyncope, syncope. CV: Denies chest pain or heart palpitations.. Resp: See HPI GI: See HPI.  Heme: See HPI  Physical Exam: BP 102/63   Pulse 68   Temp 97.8 F (36.6 C) (Temporal)   Ht 5' (1.524 m)   Wt 132 lb (59.9 kg)   BMI  25.78 kg/m  General:   Alert and oriented. No distress noted. Pleasant and cooperative.  Head:  Normocephalic and atraumatic. Eyes:  Conjuctiva clear without scleral icterus. Heart:  S1, S2 present without murmurs appreciated. Lungs:  Clear to auscultation bilaterally. No wheezes, rales, or rhonchi. No distress.  Abdomen:  +BS, soft, and non-distended.  Mild tenderness to palpation in the epigastric area.  No rebound or guarding. No HSM or masses noted. Msk:  Symmetrical without gross deformities. Normal posture. Extremities:  Without edema. Neurologic:  Alert and  oriented x4 Psych:  Normal mood and affect.

## 2019-10-11 NOTE — Progress Notes (Signed)
Referring Provider: No ref. provider found Primary Care Physician:  Terri Newman, No Pcp Per Primary GI Physician: Dr. Abbey Chatters  Chief Complaint  Terri Newman presents with   Abdominal Pain    HPI:   Guliana Weyandt is a 59 y.o. female with history of GERD, dyspepsia, H. pylori in 2015 treated with amoxicillin/Biaxin/omeprazole, s/p cholecystectomy, and adenomatous colon polyps.  Colonoscopy up-to-date in April 2021 with 2 sessile serrated polyps and external hemorrhoids, due for repeat in 5 years.  She is presenting today for follow-up.  She was last seen in our office 08/27/19.  She noted daily epigastric pain/tightness radiating across the upper abdomen occurring daily.  Not necessarily postprandial.  Noted alcohol and ibuprofen worsen abdominal pain.  Previously would have severe episodes of pain about twice a year, but daily symptoms started about 1 month prior.  Symptoms lasted 30 to 45 minutes at a time and was 6/10 in severity.  Also with worsening bloating after eating.  She had starting Protonix daily for the last 1-2 weeks with no noticeable improvement in pain.  GERD symptoms are well controlled.  No constipation or diarrhea or overt GI bleeding.  Weight was stable.  Suspected gastritis, possible unresolved H. pylori infection or PUD, less likely pancreatic or biliary etiology.  Celiac disease and SIBO off as well differential with bloating.  Plan to hold acid suppression medication and complete labs including H. pylori breath test, avoid all NSAIDs, avoid gas producing items, add daily probiotic, further recommendations to follow.  Labs completed 09/07/2019.  CBC within normal limits, CMP within normal limits, lipase within normal limits, celiac serologies negative, H. pylori breath test positive.  Plans to treat with bismuth subsalicylate, tetracycline, metronidazole, and PPI twice daily x14 days.  Individual prescriptions sent to pharmacy on 8/11.  Terri Newman called on 8/16 reporting she had  Covid.  She was advised she could hold off on taking medications until she was feeling better.  Today: Took antibiotics for H. Pylori.  Here improve exactly when she finished them.  States she went ahead and took them when she had Covid.  Continues with epigastric pain/tightness that radiates across upper abdomen. Occurring several days a week. Not worsened by food. No identified triggers. Any time of day. Has woken her up at night. No alcohol or NSAIDs. Last 30 -45 minutes. Occasional nausea but not necessarily associated with the pain. No vomiting. Still taking Protonix twice daily but doesn't feel anything has helped. No relieving factors identified. No GERD. No dysphaiga. Limiting intake because she is worried if she is full she may develop pain. Lost about 8 lbs in the last 1.5 months.   No trouble with constipation at this time. No diarrhea. Limiting vegetables and avoiding dairy but this hasn't helped with bloating. Hasn't tried adding probiotic. No brbpr or melena.   Recovered from [place]. Mild residual cough.  No shortness of breath.   Past Medical History:  Diagnosis Date   Adenomatous polyp of colon 08/2013   TWO, REDUNDANT LEFT COLON   H. pylori infection 09/2019   Treated with bismuth subsalicylate, tetracycline, metronidazole, and PPI twice daily x14 days.   Helicobacter pylori gastritis 08/2013   ABO  BID FOR 10 DAYS   Sarcoidosis    Sarcoidosis     Past Surgical History:  Procedure Laterality Date   APPENDECTOMY     CHOLECYSTECTOMY  Nov 2014   [facility]   COLONOSCOPY N/A 08/29/2013   SLF: Several simple adenomas removed, moderate sized internal hemorrhoids, slightly redundant  left colon.   COLONOSCOPY N/A 05/18/2019   Procedure: COLONOSCOPY;  Surgeon: Danie Binder, MD;   Two 5-8 mm polyps in the proximal ascending and distal ascending colon, external hemorrhoids, tortuous left colon.  Pathology with sessile serrated polyps.  Recommended next colonoscopy in 5 years.      ESOPHAGOGASTRODUODENOSCOPY N/A 08/29/2013   SLF: H. pylori gastritis treated with amoxicillin/Biaxin/omeprazole   NECK SURGERY     POLYPECTOMY  05/18/2019   Procedure: POLYPECTOMY;  Surgeon: Danie Binder, MD;  [facility];  Service: Endoscopy;;  ascending x2   TUBAL LIGATION      Current Outpatient Medications  Medication Sig Dispense Refill   acetaminophen (TYLENOL) 500 MG tablet Take 500 mg by mouth every 8 (eight) hours as needed for mild pain or headache.      Misc Natural Products (ELDERBERRY ZINC/VIT C/IMMUNE MT) Use as directed 1 tablet in the mouth or throat daily. Gummie     pantoprazole (PROTONIX) 40 MG tablet Take 1 tablet (40 mg total) by mouth daily before breakfast. 90 tablet 3   Glycerin-Hypromellose-PEG 400 (DRY EYE RELIEF DROPS) 0.2-0.2-1 % SOLN Place 1 drop into both eyes daily as needed (Dry eye).     No current facility-administered medications for this visit.    Allergies as of 10/12/2019   (No Known Allergies)    Family History  Problem Relation Age of Onset   Colon cancer Neg Hx     Social History   Socioeconomic History   Marital status: Married    Spouse name: Not on file   Number of children: Not on file   Years of education: Not on file   Highest education level: Not on file  Occupational History   Occupation: caregiver  Tobacco Use   Smoking status: Never Smoker   Smokeless tobacco: Never Used  Substance and Sexual Activity   Alcohol use: No   Drug use: No   Sexual activity: Not on file  Other Topics Concern   Not on file  Social History Narrative   Not on file   Social Determinants of Health   Financial Resource Strain:    Difficulty of Paying Living Expenses: Not on file  Food Insecurity:    Worried About [place] in the Last Year: Not on file   [place] in the Last Year: Not on file  Transportation Needs:    Lack of Transportation (Medical): Not on file   Lack of  Transportation (Non-Medical): Not on file  Physical Activity:    Days of Exercise per Week: Not on file   Minutes of Exercise per Session: Not on file  Stress:    Feeling of Stress : Not on file  Social Connections:    Frequency of Communication with Friends and Family: Not on file   Frequency of Social Gatherings with Friends and Family: Not on file   Attends Religious Services: Not on file   Active Member of Clubs or Organizations: Not on file   Attends Archivist Meetings: Not on file   Marital Status: Not on file    Review of Systems: Gen: Denies fever, chills, cold or flulike symptoms, presyncope, syncope. CV: Denies chest pain or heart palpitations.. Resp: See HPI GI: See HPI.  Heme: See HPI  Physical Exam: BP 102/63    Pulse 68    Temp 97.8 F (36.6 C) (Temporal)    Ht 5' (1.524 m)    Wt 132 lb (59.9  kg)    BMI 25.78 kg/m  General:   Alert and oriented. No distress noted. Pleasant and cooperative.  Head:  Normocephalic and atraumatic. Eyes:  Conjuctiva clear without scleral icterus. Heart:  S1, S2 present without murmurs appreciated. Lungs:  Clear to auscultation bilaterally. No wheezes, rales, or rhonchi. No distress.  Abdomen:  +BS, soft, and non-distended.  Mild tenderness to palpation in the epigastric area.  No rebound or guarding. No HSM or masses noted. Msk:  Symmetrical without gross deformities. Normal posture. Extremities:  Without edema. Neurologic:  Alert and  oriented x4 Psych:  Normal mood and affect.

## 2019-10-12 ENCOUNTER — Ambulatory Visit (INDEPENDENT_AMBULATORY_CARE_PROVIDER_SITE_OTHER): Admitting: Gastroenterology

## 2019-10-12 ENCOUNTER — Encounter: Payer: Self-pay | Admitting: Gastroenterology

## 2019-10-12 ENCOUNTER — Encounter: Payer: Self-pay | Admitting: *Deleted

## 2019-10-12 ENCOUNTER — Other Ambulatory Visit: Payer: Self-pay

## 2019-10-12 VITALS — BP 102/63 | HR 68 | Temp 97.8°F | Ht 60.0 in | Wt 132.0 lb

## 2019-10-12 DIAGNOSIS — K219 Gastro-esophageal reflux disease without esophagitis: Secondary | ICD-10-CM | POA: Diagnosis not present

## 2019-10-12 DIAGNOSIS — R1013 Epigastric pain: Secondary | ICD-10-CM | POA: Diagnosis not present

## 2019-10-12 DIAGNOSIS — R140 Abdominal distension (gaseous): Secondary | ICD-10-CM | POA: Diagnosis not present

## 2019-10-12 NOTE — Patient Instructions (Addendum)
We will get you scheduled for an upper endoscopy in the near future with Dr. Abbey Chatters.  You may decrease your Protonix to once daily.  Please try adding a probiotic to see if this helps with bloating. Good options include Jiles Crocker colon health, and digestive advantage.   Continue avoiding gas producing items including broccoli, cauliflower, cabbage, Brussels sprouts, beans, soda, artificial sweeteners, and struggles.   We will plan to see you back after your procedure.  [name], PA-C [facility]

## 2019-10-14 ENCOUNTER — Encounter: Payer: Self-pay | Admitting: Gastroenterology

## 2019-10-14 NOTE — Assessment & Plan Note (Addendum)
Patient has chronic history of dyspepsia and reported increased frequency of epigastric pain/tightness radiating across the upper abdomen with associated upper abdominal bloating at her last visit in July 2021.   She has history of H. pylori in 2015 treated with amoxicillin, Biaxin, and omeprazole.  She tested positive for H. pylori again 09/07/2019 and was treated with bismuth subsalicylate, tetracycline, metronidazole, and PPI x14 days.  Unfortunately, she has not had any improvement and symptoms are occurring almost daily with no identified trigger.  Symptoms are not necessarily worsened by food although bloating can be worse postprandially.  No alcohol or NSAIDs.  She does note pain has woken her up at night.  Also with 8 pound weight loss in the last 1.5 months.  Prior laboratory evaluation in July 2021 with CBC, CMP, lipase, and celiac serologies negative.  She is s/p cholecystectomy.  Etiology not clear.  It is concerning that she does not feel any improvement with H. pylori treatment for PPI twice daily which should treat PUD if present.  With weight loss and reported nocturnal pain, concern for possible malignancy.  Plan for EGD for further evaluation.  Plan: Okay to decrease Protonix back to daily for now. Proceed with EGD with propofol with Dr. Abbey Chatters in the near future. The risks, benefits, and alternatives have been discussed in detail with patient. They have stated understanding and desire to proceed.  ASA II May try probiotic for bloating. Avoid gas producing items.  Counseled on this. Follow-up after EGD.

## 2019-10-14 NOTE — Assessment & Plan Note (Signed)
Previously well controlled on Protonix 40 mg daily.  We had temporarily increase Protonix to 40 mg twice daily during H. pylori treatment. Unfortunately, she continues with upper abdominal pain despite H. pylori treatment as discussed below.  She is still taking Protonix twice daily at this time but does not feel this has affected her upper abdominal pain at all.  Advised that she can decrease Protonix back to once daily we will proceed with EGD for further evaluation as per below.  Plan to follow-up after procedure.

## 2019-10-14 NOTE — Assessment & Plan Note (Signed)
Addressed under dyspepsia.

## 2019-10-15 NOTE — Progress Notes (Signed)
NO PCP PER PT 

## 2019-10-18 ENCOUNTER — Other Ambulatory Visit (HOSPITAL_COMMUNITY)
Admission: RE | Admit: 2019-10-18 | Discharge: 2019-10-18 | Disposition: A | Discharge status: Home / Self Care | Source: Ambulatory Visit | Attending: Internal Medicine | Admitting: Internal Medicine

## 2019-10-18 NOTE — Progress Notes (Signed)
Called patient, she told [name] PA-C GI who works with Dr. Abbey Chatters (who is also doing her procedure) that she had Covid on 09/17/2019. Pt. Saw PA on 10/12/2019 Patient was tested at [facility] in [place] on 09/15/19. Pt was cleared to go back to work on 09-28-2019. Kristen hold her she wouldn't have to have a Covid screening since she had Covid not long ago. Pt. is going to bring proof of having covid just in case it's needed, the day of her surgery. Nothing further needed.

## 2019-10-19 ENCOUNTER — Other Ambulatory Visit: Payer: Self-pay

## 2019-10-19 ENCOUNTER — Ambulatory Visit (HOSPITAL_COMMUNITY)
Admission: RE | Admit: 2019-10-19 | Discharge: 2019-10-19 | Disposition: A | Discharge status: Home / Self Care | Attending: Internal Medicine | Admitting: Internal Medicine

## 2019-10-19 ENCOUNTER — Ambulatory Visit (HOSPITAL_COMMUNITY): Admitting: Anesthesiology

## 2019-10-19 ENCOUNTER — Encounter (HOSPITAL_COMMUNITY): Payer: Self-pay

## 2019-10-19 ENCOUNTER — Encounter (HOSPITAL_COMMUNITY)
Admission: RE | Disposition: A | Discharge status: Home / Self Care | Payer: Self-pay | Source: Home / Self Care | Attending: Internal Medicine

## 2019-10-19 DIAGNOSIS — K2970 Gastritis, unspecified, without bleeding: Secondary | ICD-10-CM | POA: Diagnosis not present

## 2019-10-19 DIAGNOSIS — R140 Abdominal distension (gaseous): Secondary | ICD-10-CM | POA: Diagnosis not present

## 2019-10-19 DIAGNOSIS — Z8616 Personal history of COVID-19: Secondary | ICD-10-CM | POA: Insufficient documentation

## 2019-10-19 DIAGNOSIS — K2950 Unspecified chronic gastritis without bleeding: Secondary | ICD-10-CM | POA: Insufficient documentation

## 2019-10-19 DIAGNOSIS — B9681 Helicobacter pylori [H. pylori] as the cause of diseases classified elsewhere: Secondary | ICD-10-CM | POA: Insufficient documentation

## 2019-10-19 DIAGNOSIS — K219 Gastro-esophageal reflux disease without esophagitis: Secondary | ICD-10-CM | POA: Diagnosis not present

## 2019-10-19 DIAGNOSIS — K30 Functional dyspepsia: Secondary | ICD-10-CM | POA: Insufficient documentation

## 2019-10-19 DIAGNOSIS — Z79899 Other long term (current) drug therapy: Secondary | ICD-10-CM | POA: Diagnosis not present

## 2019-10-19 HISTORY — PX: ESOPHAGOGASTRODUODENOSCOPY (EGD) WITH PROPOFOL: SHX5813

## 2019-10-19 HISTORY — PX: BIOPSY: SHX5522

## 2019-10-19 SURGERY — ESOPHAGOGASTRODUODENOSCOPY (EGD) WITH PROPOFOL
Anesthesia: General

## 2019-10-19 MED ORDER — LIDOCAINE HCL (CARDIAC) PF 100 MG/5ML IV SOSY
PREFILLED_SYRINGE | INTRAVENOUS | Status: DC | PRN
  Administered 2019-10-19: 50 mg via INTRATRACHEAL

## 2019-10-19 MED ORDER — CHLORHEXIDINE GLUCONATE CLOTH 2 % EX PADS: 6.0000 | MEDICATED_PAD | Freq: Once | CUTANEOUS | Status: DC

## 2019-10-19 MED ORDER — STERILE WATER FOR IRRIGATION IR SOLN
Status: DC | PRN
  Administered 2019-10-19: 1.5 mL

## 2019-10-19 MED ORDER — COLESTIPOL HCL 1 G PO TABS: 1.0000 g | ORAL_TABLET | Freq: Two times a day (BID) | ORAL | 5 refills | Status: DC

## 2019-10-19 MED ORDER — GLYCOPYRROLATE 0.2 MG/ML IJ SOLN: 0.2000 mg | Freq: Once | INTRAMUSCULAR | Status: DC

## 2019-10-19 MED ORDER — LACTATED RINGERS IV SOLN: Freq: Once | INTRAVENOUS | Status: AC

## 2019-10-19 MED ORDER — PROPOFOL 10 MG/ML IV BOLUS
INTRAVENOUS | Status: DC | PRN
  Administered 2019-10-19: 100 mg via INTRAVENOUS

## 2019-10-19 MED ORDER — PROPOFOL 500 MG/50ML IV EMUL
INTRAVENOUS | Status: DC | PRN
  Administered 2019-10-19: 170 ug/kg/min via INTRAVENOUS

## 2019-10-19 MED ORDER — LACTATED RINGERS IV SOLN: INTRAVENOUS | Status: DC | PRN

## 2019-10-19 MED ORDER — LIDOCAINE VISCOUS HCL 2 % MT SOLN
OROMUCOSAL | Status: AC
  Filled 2019-10-19: qty 15

## 2019-10-19 MED ORDER — COLESTIPOL HCL 1 G PO TABS: 2.0000 g | ORAL_TABLET | Freq: Two times a day (BID) | ORAL | 5 refills | Status: DC

## 2019-10-19 MED ORDER — KETAMINE HCL 50 MG/5ML IJ SOSY
PREFILLED_SYRINGE | INTRAMUSCULAR | Status: AC
  Filled 2019-10-19: qty 5

## 2019-10-19 MED ORDER — LIDOCAINE VISCOUS HCL 2 % MT SOLN: 15.0000 mL | Freq: Once | OROMUCOSAL | Status: DC

## 2019-10-19 NOTE — Discharge Instructions (Signed)
EGD Discharge instructions Please read the instructions outlined below and refer to this sheet in the next few weeks. These discharge instructions provide you with general information on caring for yourself after you leave the hospital. Your doctor may also give you specific instructions. While your treatment has been planned according to the most current medical practices available, unavoidable complications occasionally occur. If you have any problems or questions after discharge, please call your doctor. ACTIVITY  You may resume your regular activity but move at a slower pace for the next 24 hours.   Take frequent rest periods for the next 24 hours.   Walking will help expel (get rid of) the air and reduce the bloated feeling in your abdomen.   No driving for 24 hours (because of the anesthesia (medicine) used during the test).   You may shower.   Do not sign any important legal documents or operate any machinery for 24 hours (because of the anesthesia used during the test).  NUTRITION  Drink plenty of fluids.   You may resume your normal diet.   Begin with a light meal and progress to your normal diet.   Avoid alcoholic beverages for 24 hours or as instructed by your caregiver.  MEDICATIONS  You may resume your normal medications unless your caregiver tells you otherwise.  WHAT YOU CAN EXPECT TODAY  You may experience abdominal discomfort such as a feeling of fullness or "gas" pains.  FOLLOW-UP  Your doctor will discuss the results of your test with you.  SEEK IMMEDIATE MEDICAL ATTENTION IF ANY OF THE FOLLOWING OCCUR:  Excessive nausea (feeling sick to your stomach) and/or vomiting.   Severe abdominal pain and distention (swelling).   Trouble swallowing.   Temperature over 101 F (37.8 C).   Rectal bleeding or vomiting of blood.   Your EGD showed inflammation in the stomach.  I biopsied this to rule out H. pylori infection.  I also biopsied your small bowel to rule  out celiac disease.  Continue on Protonix 40 mg daily.  I sent in a new prescription for a medicine called colestipol for which you will take 2 g twice daily.  We will have you follow-up in our clinic in 6 weeks.  Await pathology results.  My office will contact you next week.  I hope you have a great rest of your week!  Elon Alas. Abbey Chatters, D.O. Gastroenterology and Hepatology [facility]

## 2019-10-19 NOTE — Transfer of Care (Signed)
Immediate Anesthesia Transfer of Care Note  Patient: Terri Newman  Procedure(s) Performed: ESOPHAGOGASTRODUODENOSCOPY (EGD) WITH PROPOFOL (N/A ) BIOPSY  Patient Location: Endoscopy Unit  Anesthesia Type:MAC  Level of Consciousness: drowsy  Airway & Oxygen Therapy: Patient Spontanous Breathing  Post-op Assessment: Report given to RN and Post -op Vital signs reviewed and stable  Post vital signs: Reviewed and stable  Last Vitals:  Vitals Value Taken Time  BP    Temp    Pulse    Resp    SpO2      Last Pain:  Vitals:   10/19/19 1000  TempSrc:   PainSc: 0-No pain         Complications: No complications documented.

## 2019-10-19 NOTE — Anesthesia Preprocedure Evaluation (Addendum)
Anesthesia Evaluation  Patient identified by MRN, date of birth, ID band Patient awake    Reviewed: Allergy & Precautions, NPO status , Patient's Chart, lab work & pertinent test results  Airway Mallampati: II  TM Distance: >3 FB Neck ROM: Full    Dental  (+) Teeth Intact Fillings :   Pulmonary  Sarcoidosis in remission, used prednisone in 2014   Pulmonary exam normal breath sounds clear to auscultation       Cardiovascular Exercise Tolerance: Good Normal cardiovascular exam Rhythm:Regular Rate:Normal     Neuro/Psych negative neurological ROS  negative psych ROS   GI/Hepatic Neg liver ROS, GERD  Medicated and Poorly Controlled,  Endo/Other  negative endocrine ROS  Renal/GU negative Renal ROS     Musculoskeletal Neck sx    Abdominal   Peds  Hematology negative hematology ROS (+)   Anesthesia Other Findings   Reproductive/Obstetrics negative OB ROS                            Anesthesia Physical Anesthesia Plan  ASA: II  Anesthesia Plan: General   Post-op Pain Management:    Induction: Intravenous  PONV Risk Score and Plan: TIVA  Airway Management Planned: Nasal Cannula and Natural Airway  Additional Equipment:   Intra-op Plan:   Post-operative Plan:   Informed Consent: I have reviewed the patients History and Physical, chart, labs and discussed the procedure including the risks, benefits and alternatives for the proposed anesthesia with the patient or authorized representative who has indicated his/her understanding and acceptance.     Dental advisory given  Plan Discussed with: CRNA and Surgeon  Anesthesia Plan Comments:        Anesthesia Quick Evaluation

## 2019-10-19 NOTE — Op Note (Signed)
[facility] Patient Name: Terri Newman Procedure Date: 10/19/2019 9:56 AM MRN: [id number] Date of Birth: [birth year] Attending MD: Elon Alas. Abbey Chatters DO CSN: [id number] Age: 59 Admit Type: Outpatient Procedure:                Upper GI endoscopy Indications:              Functional Dyspepsia Providers:                Elon Alas. Satoya Feeley, DO, Otis Peak B. Sharon Seller, RN,                            Caprice Kluver, Casimer Bilis, Technician Referring MD:              Medicines:                See the Anesthesia note for documentation of the                            administered medications Complications:            No immediate complications. Estimated Blood Loss:     Estimated blood loss was minimal. Procedure:                Pre-Anesthesia Assessment:                           - The anesthesia plan was to use monitored                            anesthesia care (MAC).                           After obtaining informed consent, the endoscope was                            passed under direct vision. Throughout the                            procedure, the patient's blood pressure, pulse, and                            oxygen saturations were monitored continuously. The                            [id number]) was introduced through the mouth,                            and advanced to the second part of duodenum. The                            upper GI endoscopy was accomplished without                            difficulty. The patient tolerated the procedure                            well. Scope In:  10:07:13 AM Scope Out: 10:10:33 AM Total Procedure Duration: 0 hours 3 minutes 20 seconds  Findings:      There is no endoscopic evidence of bleeding, areas of erosion,       esophagitis, hiatal hernia, inflammation, stenosis, stricture,       ulcerations or varices in the entire esophagus.      Diffuse mild inflammation characterized by erythema was found in the        entire examined stomach. Biopsies were taken with a cold forceps for       Helicobacter pylori testing.      The duodenal bulb, first portion of the duodenum and second portion of       the duodenum were normal. Biopsies for histology were taken with a cold       forceps for evaluation of celiac disease. Impression:               - Gastritis. Biopsied.                           - Normal duodenal bulb, first portion of the                            duodenum and second portion of the duodenum.                            Biopsied. Moderate Sedation:      Per Anesthesia Care Recommendation:           - Patient has a contact number available for                            emergencies. The signs and symptoms of potential                            delayed complications were discussed with the                            patient. Return to normal activities tomorrow.                            Written discharge instructions were provided to the                            patient.                           - Resume previous diet.                           - Continue present medications.                           - Await pathology results.                           - Will trial on Colestipol 2g twice daily                           -  Use a proton pump inhibitor PO daily.                           - Return to GI clinic in 6 weeks with [name]. Procedure Code(s):        --- Professional ---                           [phone], Esophagogastroduodenoscopy, flexible,                            transoral; with biopsy, single or multiple Diagnosis Code(s):        --- Professional ---                           K29.70, Gastritis, unspecified, without bleeding                           K30, Functional dyspepsia CPT copyright 2019 American Medical Association. All rights reserved. The codes documented in this report are preliminary and upon coder review may  be revised to  meet current compliance requirements. Elon Alas. Abbey Chatters, [place] Abbey Chatters, DO 10/19/2019 10:33:41 AM This report has been signed electronically. Number of Addenda: 0

## 2019-10-19 NOTE — Interval H&P Note (Signed)
History and Physical Interval Note:  10/19/2019 9:20 AM  Terri Newman  has presented today for surgery, with the diagnosis of dyspepsia, gerd, bloating.  The various methods of treatment have been discussed with the patient and family. After consideration of risks, benefits and other options for treatment, the patient has consented to  Procedure(s) with comments: ESOPHAGOGASTRODUODENOSCOPY (EGD) WITH PROPOFOL (N/A) - 1:30pm _patient had positive covid test 09/15/19 in [place], [place], office faxed results-pt to be here at 9:00am as a surgical intervention.  The patient's history has been reviewed, patient examined, no change in status, stable for surgery.  I have reviewed the patient's chart and labs.  Questions were answered to the patient's satisfaction.     Eloise Harman

## 2019-10-19 NOTE — Anesthesia Postprocedure Evaluation (Signed)
Anesthesia Post Note  Patient: Terri Newman  Procedure(s) Performed: ESOPHAGOGASTRODUODENOSCOPY (EGD) WITH PROPOFOL (N/A ) BIOPSY  Patient location during evaluation: Endoscopy Anesthesia Type: General Level of consciousness: awake and alert Pain management: pain level controlled Vital Signs Assessment: post-procedure vital signs reviewed and stable Respiratory status: spontaneous breathing Cardiovascular status: stable Postop Assessment: no apparent nausea or vomiting Anesthetic complications: no   No complications documented.   Last Vitals:  Vitals:   10/19/19 0905 10/19/19 1016  BP: 110/68 105/60  Pulse: 66 65  Resp: 10 13  Temp: 36.8 C   SpO2: 100% 95%    Last Pain:  Vitals:   10/19/19 1016  TempSrc: Oral  PainSc: 0-No pain                 Everette Rank

## 2019-10-22 ENCOUNTER — Other Ambulatory Visit: Payer: Self-pay

## 2019-10-22 LAB — SURGICAL PATHOLOGY

## 2019-10-24 ENCOUNTER — Encounter (HOSPITAL_COMMUNITY): Payer: Self-pay | Admitting: Internal Medicine

## 2019-10-25 ENCOUNTER — Telehealth: Payer: Self-pay | Admitting: Internal Medicine

## 2019-10-25 NOTE — Telephone Encounter (Signed)
Spoke with pt. Pt is aware that a message was sent to the provider yesterday. Per Dr. Abbey Chatters, he is going to discuss this with [name], PA.

## 2019-10-25 NOTE — Telephone Encounter (Signed)
PATIENT CALLED WANTING TO KNOW WHAT HAS BEEN DECIDED ABOUT HER H PYLORI TREATMENT

## 2019-10-29 ENCOUNTER — Other Ambulatory Visit: Payer: Self-pay | Admitting: Gastroenterology

## 2019-10-29 ENCOUNTER — Telehealth: Payer: Self-pay | Admitting: Internal Medicine

## 2019-10-29 DIAGNOSIS — A048 Other specified bacterial intestinal infections: Secondary | ICD-10-CM

## 2019-10-29 MED ORDER — AMOXICILLIN 250 MG PO CAPS: 750.0000 mg | ORAL_CAPSULE | Freq: Three times a day (TID) | ORAL | 0 refills | Status: AC

## 2019-10-29 MED ORDER — LEVOFLOXACIN 500 MG PO TABS: 500.0000 mg | ORAL_TABLET | Freq: Every day | ORAL | 0 refills | Status: AC

## 2019-10-29 NOTE — Telephone Encounter (Signed)
Patient returned call, please call back after 3:30, when she gets off work

## 2019-10-29 NOTE — Telephone Encounter (Signed)
Spoke with pt and she was made aware of Kristen Harper's recommendations.  See path result note.

## 2019-11-01 ENCOUNTER — Other Ambulatory Visit: Payer: Self-pay

## 2019-11-01 ENCOUNTER — Ambulatory Visit
Admission: RE | Admit: 2019-11-01 | Discharge: 2019-11-01 | Disposition: A | Discharge status: Home / Self Care | Source: Ambulatory Visit | Attending: Student | Admitting: Student

## 2019-11-01 DIAGNOSIS — M542 Cervicalgia: Secondary | ICD-10-CM

## 2019-11-05 ENCOUNTER — Institutional Professional Consult (permissible substitution): Admitting: Internal Medicine

## 2019-11-09 ENCOUNTER — Institutional Professional Consult (permissible substitution): Admitting: Pulmonary Disease

## 2019-11-14 ENCOUNTER — Telehealth: Payer: Self-pay | Admitting: Internal Medicine

## 2019-11-14 NOTE — Telephone Encounter (Signed)
Routing to [employer], [place] and will call pt with recommendations.

## 2019-11-14 NOTE — Telephone Encounter (Signed)
Tried to call pt to see how she was doing but had to leave a vm.

## 2019-11-14 NOTE — Telephone Encounter (Signed)
Pt called to say that she has finished taking her antibiotics for 14 days and what does she need to do now. Please call [phone] (if no answer because she's at work, leave a VM)

## 2019-11-15 NOTE — Telephone Encounter (Signed)
Lmom for pt to call me back. 

## 2019-11-15 NOTE — Telephone Encounter (Signed)
She can reduce protonix to once daily. She can also resume colestipol as Dr. Abbey Chatters had prescribed. We will follow-up with her in the office as scheduled on 11/1. We will need to test for H. Pylori eradication in the future. It is too soon to test at this time. We will discuss this at her follow-up.

## 2019-11-22 ENCOUNTER — Encounter: Payer: Self-pay | Admitting: Orthopaedic Surgery

## 2019-11-22 ENCOUNTER — Ambulatory Visit (INDEPENDENT_AMBULATORY_CARE_PROVIDER_SITE_OTHER): Admitting: Orthopaedic Surgery

## 2019-11-22 ENCOUNTER — Other Ambulatory Visit: Payer: Self-pay

## 2019-11-22 ENCOUNTER — Ambulatory Visit (INDEPENDENT_AMBULATORY_CARE_PROVIDER_SITE_OTHER)

## 2019-11-22 VITALS — Ht 60.0 in | Wt 131.0 lb

## 2019-11-22 DIAGNOSIS — M25561 Pain in right knee: Secondary | ICD-10-CM

## 2019-11-22 DIAGNOSIS — M659 Synovitis and tenosynovitis, unspecified: Secondary | ICD-10-CM | POA: Diagnosis not present

## 2019-11-22 DIAGNOSIS — G8929 Other chronic pain: Secondary | ICD-10-CM | POA: Diagnosis not present

## 2019-11-25 DIAGNOSIS — M659 Synovitis and tenosynovitis, unspecified: Secondary | ICD-10-CM

## 2019-11-25 MED ORDER — LIDOCAINE HCL 1 % IJ SOLN
0.5000 mL | INTRAMUSCULAR | Status: AC | PRN
  Administered 2019-11-25: .5 mL

## 2019-11-25 MED ORDER — METHYLPREDNISOLONE ACETATE 40 MG/ML IJ SUSP
40.0000 mg | INTRAMUSCULAR | Status: AC | PRN
  Administered 2019-11-25: 40 mg via INTRA_ARTICULAR

## 2019-11-25 MED ORDER — BUPIVACAINE HCL 0.25 % IJ SOLN
4.0000 mL | INTRAMUSCULAR | Status: AC | PRN
  Administered 2019-11-25: 4 mL via INTRA_ARTICULAR

## 2019-11-25 NOTE — Progress Notes (Signed)
Office Visit Note   Patient: Terri Newman           Date of Birth: 04/07/1960           MRN: [id number] Visit Date: 11/22/2019              Requested by: No referring provider defined for this encounter. PCP: Patient, No Pcp Per   Assessment & Plan: Visit Diagnoses:  1. Chronic pain of right knee   2. Synovitis of knee     Plan: Aspiration injection performed she tolerated well was able to ambulate with significant improvement in pain.  We will see how she does with this and she can return if she has persistent problems.  Follow-Up Instructions: No follow-ups on file.   Orders:  Orders Placed This Encounter  Procedures  . Large Joint Inj  . XR KNEE 3 VIEW RIGHT   No orders of the defined types were placed in this encounter.     Procedures: Large Joint Inj: R knee on 11/25/2019 5:01 PM Indications: pain and joint swelling Details: 22 G 1.5 in needle, anterolateral approach  Arthrogram: No  Medications: 40 mg methylPREDNISolone acetate 40 MG/ML; 0.5 mL lidocaine 1 %; 4 mL bupivacaine 0.25 % Aspirate: 20 mL yellow and clear Outcome: tolerated well, no immediate complications Procedure, treatment alternatives, risks and benefits explained, specific risks discussed. Consent was given by the patient. Immediately prior to procedure a time out was called to verify the correct patient, procedure, equipment, support staff and site/side marked as required. Patient was prepped and draped in the usual sterile fashion.       Clinical Data: No additional findings.   Subjective: Chief Complaint  Patient presents with  . Right Knee - Pain    HPI 59 year old female seen with right knee pain no known injury.  She has had pain and swelling gradually increasing.  It does not lock on her.  She had previous aspiration 2010 an injection and states it did great for many years.  Recently she has tried anti-inflammatories but it does not help.  Review of Systems years neck  surgery 2000 and gallbladder 2014 tubal ligation 82.  Positive for acid reflux.   Objective: Vital Signs: Ht 5' (1.524 m)   Wt 131 lb (59.4 kg)   BMI 25.58 kg/m   Physical Exam Constitutional:      Appearance: She is well-developed.  HENT:     Head: Normocephalic.     Right Ear: External ear normal.     Left Ear: External ear normal.  Eyes:     Pupils: Pupils are equal, round, and reactive to light.  Neck:     Thyroid: No thyromegaly.     Trachea: No tracheal deviation.  Cardiovascular:     Rate and Rhythm: Normal rate.  Pulmonary:     Effort: Pulmonary effort is normal.  Abdominal:     Palpations: Abdomen is soft.  Skin:    General: Skin is warm and dry.  Neurological:     Mental Status: She is alert and oriented to person, place, and time.  Psychiatric:        Behavior: Behavior normal.     Ortho Exam patient has 2-3+ knee effusion.  Diffuse tenderness.  Reflexes are 2+ and symmetrical negative logroll to the hips.  Ligamentous exam is normal.  Distal pulses are 2+ no palpable  Baker's cyst. Patient ambulates with a slight knee limp first keep her knee straight. Specialty Comments:  No specialty  comments available.  Imaging: No results found.   PMFS History: Patient Active Problem List   Diagnosis Date Noted  . Synovitis of knee 11/25/2019  . Bloating 08/27/2019  . GERD (gastroesophageal reflux disease) 08/27/2019  . Polyp of ascending colon   . H/O adenomatous polyp of colon 02/28/2019  . Upper abdominal pain 02/28/2019  . Anterior chest wall pain 02/14/2014  . Dyspepsia 08/23/2013  . Constipation by delayed colonic transit 08/23/2013   Past Medical History:  Diagnosis Date  . Adenomatous polyp of colon 08/2013   TWO, REDUNDANT LEFT COLON  . H. pylori infection 09/2019   Treated with bismuth subsalicylate, tetracycline, metronidazole, and PPI twice daily x14 days.  . Helicobacter pylori gastritis 08/2013   ABO  BID FOR 10 DAYS  . Sarcoidosis   .  Sarcoidosis     Family History  Problem Relation Age of Onset  . Colon cancer Neg Hx     Past Surgical History:  Procedure Laterality Date  . APPENDECTOMY    . BIOPSY  10/19/2019   Procedure: BIOPSY;  Surgeon: Eloise Harman, DO;  Location: [facility];  Service: Endoscopy;;  . CHOLECYSTECTOMY  Nov 2014   [facility]  . COLONOSCOPY N/A 08/29/2013   SLF: Several simple adenomas removed, moderate sized internal hemorrhoids, slightly redundant left colon.  . COLONOSCOPY N/A 05/18/2019   Procedure: COLONOSCOPY;  Surgeon: Danie Binder, MD;   Two 5-8 mm polyps in the proximal ascending and distal ascending colon, external hemorrhoids, tortuous left colon.  Pathology with sessile serrated polyps.  Recommended next colonoscopy in 5 years.    . ESOPHAGOGASTRODUODENOSCOPY N/A 08/29/2013   SLF: H. pylori gastritis treated with amoxicillin/Biaxin/omeprazole  . ESOPHAGOGASTRODUODENOSCOPY (EGD) WITH PROPOFOL N/A 10/19/2019   Procedure: ESOPHAGOGASTRODUODENOSCOPY (EGD) WITH PROPOFOL;  Surgeon: Eloise Harman, DO;  Location: [facility];  Service: Endoscopy;  Laterality: N/A;  1:30pm _patient had positive covid test 09/15/19 in [place], [place], office faxed results-pt to be here at [place]    . POLYPECTOMY  05/18/2019   Procedure: POLYPECTOMY;  Surgeon: Danie Binder, MD;  Location: [facility];  Service: Endoscopy;;  ascending x2  . TUBAL LIGATION     Social History   Occupational History  . Occupation: caregiver  Tobacco Use  . Smoking status: Never Smoker  . Smokeless tobacco: Never Used  Vaping Use  . Vaping Use: Never used  Substance and Sexual Activity  . Alcohol use: No  . Drug use: No  . Sexual activity: Not on file

## 2019-11-28 ENCOUNTER — Ambulatory Visit: Admitting: Gastroenterology

## 2019-12-03 ENCOUNTER — Ambulatory Visit: Admitting: Gastroenterology

## 2019-12-13 ENCOUNTER — Telehealth: Payer: Self-pay | Admitting: Internal Medicine

## 2019-12-13 NOTE — Telephone Encounter (Signed)
[phone]  Patient would like to speak to a nurse.  She has been doing fine, but today she has had constant stomach pain. She is concerned that it is H-Pylori again.

## 2019-12-14 ENCOUNTER — Telehealth: Payer: Self-pay | Admitting: Internal Medicine

## 2019-12-14 NOTE — Telephone Encounter (Signed)
Pt. Called yesterday and tody regarding her meds were not working. She has pain ,burning and some nausea , no vomiting in her upper abdomen area. Please advise.

## 2019-12-14 NOTE — Telephone Encounter (Signed)
Pt wants the nurse to call her regarding meds that aren't helping. [phone]

## 2019-12-14 NOTE — Telephone Encounter (Signed)
See other phone note

## 2019-12-19 NOTE — Telephone Encounter (Signed)
PHONED PT X 2 HER LINE WAS BUSY. WILL TRY AND CALL HER BACK LATER

## 2019-12-19 NOTE — Telephone Encounter (Signed)
Needs OV.  

## 2019-12-20 NOTE — Telephone Encounter (Signed)
Phoned pt and Lm for her to call here for an office visit to see Dr. Abbey Chatters.

## 2020-01-02 ENCOUNTER — Ambulatory Visit (INDEPENDENT_AMBULATORY_CARE_PROVIDER_SITE_OTHER): Admitting: Internal Medicine

## 2020-01-02 ENCOUNTER — Encounter: Payer: Self-pay | Admitting: Internal Medicine

## 2020-01-02 ENCOUNTER — Other Ambulatory Visit: Payer: Self-pay

## 2020-01-02 DIAGNOSIS — D869 Sarcoidosis, unspecified: Secondary | ICD-10-CM

## 2020-01-02 DIAGNOSIS — Z7689 Persons encountering health services in other specified circumstances: Secondary | ICD-10-CM | POA: Insufficient documentation

## 2020-01-02 DIAGNOSIS — Z0000 Encounter for general adult medical examination without abnormal findings: Secondary | ICD-10-CM

## 2020-01-02 NOTE — Patient Instructions (Addendum)
I very strongly recommend you get the moderna or pfizer vaccine as soon as possible based on your risk of dying from the virus  and the proven safety and benefit of these vaccines against even the delta variant.  This can save your life as well as  those of your loved ones,  especially if they are also not vaccinated.    My office will be calling to set up primary care with an office here in [place]  And you will need  pneumovax and Tdap    Follow up here is as needed

## 2020-01-02 NOTE — Progress Notes (Signed)
Terri Newman, female    DOB: Jan 12, 1961,  MRN: [id number]   Brief patient profile:  35 yowf never smoker dx with sarcoid 2013 by [facility]/mediastinoscopy at [facility] for arthritis / doe on pred x 8 months - no eye issues and no flare off prednisone.  Had covid19 August 2021   Minimally sick / never vaccinated but considering   History of Present Illness  01/02/2020  Pulmonary/ 1st office eval/ [facility] / [facility]  Chief Complaint  Patient presents with  . Pulmonary Consult    Former patient of Dr Luan Pulling- Sarcoid. Pt states has had cough and congestion x 5 days.   Dyspnea: power walks x 30 min x 3 x per week s sob  Cough: [facility] in [place] acute uri rec pred did not take / just bromfed/ no abx  Sleep: ok flat bed/ 3 pillows x years  SABA use: none now  No arthritis, rash, or ocular symptoms   No obvious day to day or daytime variability or assoc excess/ purulent sputum or mucus plugs or hemoptysis or cp or chest tightness, subjective wheeze or overt sinus or hb symptoms.   Sleeping as above  without nocturnal  or early am exacerbation  of respiratory  c/o's or need for noct saba. Also denies any obvious fluctuation of symptoms with weather or environmental changes or other aggravating or alleviating factors except as outlined above   No unusual exposure hx or h/o childhood pna/ asthma or knowledge of premature birth.  Current Allergies, Complete Past Medical History, Past Surgical History, Family History, and Social History were reviewed in [employer] record.  ROS  The following are not active complaints unless bolded Hoarseness, sore throat, dysphagia, dental problems, itching, sneezing,  nasal congestion or discharge of excess mucus or purulent secretions, ear ache,   fever, chills, sweats, unintended wt loss or wt gain, classically pleuritic or exertional cp,  orthopnea pnd or arm/hand swelling  or leg swelling, presyncope, palpitations, abdominal pain  since gb removed/Kristen Hopper at [facility] office following anorexia, nausea, vomiting, diarrhea  or change in bowel habits or change in bladder habits, change in stools or change in urine, dysuria, hematuria,  rash, arthralgias, visual complaints, headache, numbness, weakness or ataxia or problems with walking or coordination,  change in mood or  memory.           Past Medical History:  Diagnosis Date  . Adenomatous polyp of colon 08/2013   TWO, REDUNDANT LEFT COLON  . H. pylori infection 09/2019   Treated with bismuth subsalicylate, tetracycline, metronidazole, and PPI twice daily x14 days.  . Helicobacter pylori gastritis 08/2013   ABO  BID FOR 10 DAYS  . Sarcoidosis   . Sarcoidosis     Outpatient Medications Prior to Visit  Medication Sig Dispense Refill  . acetaminophen (TYLENOL) 500 MG tablet Take 500 mg by mouth every 8 (eight) hours as needed for mild pain or headache.     . brompheniramine-pseudoephedrine-DM 30-2-10 MG/5ML syrup Take 5 mLs by mouth 4 (four) times daily as needed.    . Glycerin-Hypromellose-PEG 400 (DRY EYE RELIEF DROPS) 0.2-0.2-1 % SOLN Place 1 drop into both eyes daily as needed (Dry eye).     . Misc Natural Products (ELDERBERRY ZINC/VIT C/IMMUNE MT) Use as directed 1 tablet in the mouth or throat daily. Gummie    . pantoprazole (PROTONIX) 40 MG tablet Take 1 tablet (40 mg total) by mouth daily before breakfast. 90 tablet 3  .  Objective:     BP 112/68 (BP Location: Left Arm, Cuff Size: Normal)   Pulse 80   Temp (!) 97.3 F (36.3 C) (Temporal)   Ht 5' (1.524 m)   Wt 129 lb (58.5 kg)   SpO2 97% Comment: on RA  BMI 25.19 kg/m   SpO2: 97 % (on RA)   Somber amb wf nad   HEENT : pt wearing mask not removed for exam due to covid -19 concerns.    NECK :  without JVD/Nodes/TM/ nl carotid upstrokes bilaterally   LUNGS: no acc muscle use,  Nl contour chest which is clear to A and P bilaterally without cough on insp or exp  maneuvers   CV:  RRR  no s3 or murmur or increase in P2, and no edema   ABD:  soft and nontender with nl inspiratory excursion in the supine position. No bruits or organomegaly appreciated, bowel sounds nl  MS:  Nl gait/ ext warm without deformities, calf tenderness, cyanosis or clubbing No obvious joint restrictions   SKIN: warm and dry without lesions    NEURO:  alert, approp, nl sensorium with  no motor or cerebellar deficits apparent.       I personally reviewed images and agree with radiology impression as follows:   Chest CTa  02/05/19  No evidence of pulmonary embolus. Minimal linear atelectasis within the anterior right middle lobe and left lower lobe. No airspace consolidation. My review:  No evidence of sarcoid    Labs   reviewed:      Chemistry      Component Value Date/Time   NA 140 09/07/2019 0855   NA 138 08/05/2017 0746   K 4.7 09/07/2019 0855   K 4.5 08/05/2017 0746   CL 107 09/07/2019 0855   CL 104 08/05/2017 0746   CO2 30 09/07/2019 0855   CO2 29 08/05/2017 0746   BUN 14 09/07/2019 0855   BUN 17 08/05/2017 0746   CREATININE 0.80 09/07/2019 0855      Component Value Date/Time   CALCIUM 9.8 09/07/2019 0855   CALCIUM 9.5 08/05/2017 0746   ALKPHOS 75 08/05/2017 0746   AST 15 09/07/2019 0855   AST 13 08/05/2017 0746   ALT 9 09/07/2019 0855   ALT 12 08/05/2017 0746   BILITOT 0.5 09/07/2019 0855   BILITOT 0.4 08/05/2017 0746        Lab Results  Component Value Date   WBC 5.2 09/07/2019   HGB 12.6 09/07/2019   HCT 38.5 09/07/2019   MCV 88.7 09/07/2019   PLT 225 09/07/2019               Assessment   Sarcoidosis Dx 2013 by mediastinoscopy/[facility] with ankle arthritis with minimal doe/ resolved p 8 m prednisone and never flared off -  CTa chest  02/05/19 no adenopathy or ILD   A good rule of thumb is that >95% of pts with active sarcoid in any organ will have some plain cxr changes - on the other hand  if there are active pulmonary  symptoms the cxr will look much worse than the patient:  No evidence of either scenario here/ strongly doubt active dz    She does need annual eye exams for late complications of occular dz though never apparently had any obvious involvement  Pulmonary f/u can by prn > referred for PCP in [place]/ health maint purposes need pneumovax now and prevnar at 81    Patient is unvaccinated and was informed of  the seriousness of COVID 19 infection as a direct risk to lung health as well as safety to close contacts and should continue to wear a facemask in public and minimize exposure to public locations but especially avoid any area or activity where non-close contacts are not observing distancing or wearing an appropriate face mask.  I strongly recommended pt take either of the vaccines available through local drugstores based on updated information on millions of Americans treated with the [place] products  which have proven both safe and  Effective.            Christinia Gully, MD 01/02/2020

## 2020-01-02 NOTE — Assessment & Plan Note (Signed)
Dx 2013 by mediastinoscopy/[facility] with ankle arthritis with minimal doe/ resolved p 8 m prednisone and never flared off -  CTa chest  02/05/19 no adenopathy or ILD   A good rule of thumb is that >95% of pts with active sarcoid in any organ will have some plain cxr changes - on the other hand  if there are active pulmonary symptoms the cxr will look much worse than the patient:  No evidence of either scenario here/ strongly doubt active dz    She does need annual eye exams for late complications of occular dz though never apparently had any obvious involvement  Pulmonary f/u can by prn > referred for PCP in [place]/ health maint purposes need pneumovax now and prevnar at 35    Patient is unvaccinated and was informed of the seriousness of COVID 19 infection as a direct risk to lung health as well as safety to close contacts and should continue to wear a facemask in public and minimize exposure to public locations but especially avoid any area or activity where non-close contacts are not observing distancing or wearing an appropriate face mask.  I strongly recommended pt take either of the vaccines available through local drugstores based on updated information on millions of Americans treated with the [place] products  which have proven both safe and  Effective.

## 2020-01-08 ENCOUNTER — Ambulatory Visit (INDEPENDENT_AMBULATORY_CARE_PROVIDER_SITE_OTHER): Admitting: Nurse Practitioner

## 2020-01-14 ENCOUNTER — Ambulatory Visit (INDEPENDENT_AMBULATORY_CARE_PROVIDER_SITE_OTHER): Admitting: Internal Medicine

## 2020-01-14 ENCOUNTER — Ambulatory Visit: Admitting: Gastroenterology

## 2020-02-14 ENCOUNTER — Ambulatory Visit (INDEPENDENT_AMBULATORY_CARE_PROVIDER_SITE_OTHER): Admitting: Internal Medicine

## 2020-02-14 ENCOUNTER — Encounter: Payer: Self-pay | Admitting: Internal Medicine

## 2020-02-14 ENCOUNTER — Other Ambulatory Visit: Payer: Self-pay

## 2020-02-14 VITALS — BP 105/64 | HR 73 | Temp 96.8°F | Ht 60.0 in | Wt 129.6 lb

## 2020-02-14 DIAGNOSIS — R1013 Epigastric pain: Secondary | ICD-10-CM | POA: Diagnosis not present

## 2020-02-14 DIAGNOSIS — K2970 Gastritis, unspecified, without bleeding: Secondary | ICD-10-CM

## 2020-02-14 DIAGNOSIS — B9681 Helicobacter pylori [H. pylori] as the cause of diseases classified elsewhere: Secondary | ICD-10-CM | POA: Diagnosis not present

## 2020-02-14 DIAGNOSIS — R1010 Upper abdominal pain, unspecified: Secondary | ICD-10-CM | POA: Diagnosis not present

## 2020-02-14 MED ORDER — PANTOPRAZOLE SODIUM 40 MG PO TBEC: 40.0000 mg | DELAYED_RELEASE_TABLET | Freq: Two times a day (BID) | ORAL | 3 refills | Status: DC

## 2020-02-14 NOTE — Patient Instructions (Signed)
We will test you for eradication of H. pylori infection.  You will need to hold your pantoprazole for 2 weeks prior to testing.  You can take Pepcid over-the-counter during that time.  Follow-up with GI in 3 months.  At [facility] we value your feedback. You may receive a survey about your visit today. Please share your experience as we strive to create trusting relationships with our patients to provide genuine, compassionate, quality care.  We appreciate your understanding and patience as we review any laboratory studies, imaging, and other diagnostic tests that are ordered as we care for you. Our office policy is 5 business days for review of these results, and any emergent or urgent results are addressed in a timely manner for your best interest. If you do not hear from our office in 1 week, please contact us.   We also encourage the use of MyChart, which contains your medical information for your review as well. If you are not enrolled in this feature, an access code is on this after visit summary for your convenience. Thank you for allowing [place] to be involved in your care.  It was great to see you today!  I hope you have a great rest of your winter!!    Elon Alas. Abbey Chatters, D.O. Gastroenterology and Hepatology [facility]

## 2020-02-14 NOTE — Progress Notes (Signed)
Referring Provider: No ref. provider found Primary Care Physician:  Patient, No Pcp Per Primary GI:  Dr. Abbey Chatters  Chief Complaint  Patient presents with  . Gastroesophageal Reflux    Protonix bid helping    HPI:   Terri Newman is a 60 y.o. female who presents to the clinic today for follow-up visit.  She has a history of H. pylori gastritis which has been treated multiple times going back to 2015.  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  She completed a course of levofloxacin, amoxicillin, and Protonix.  She states her symptoms are slightly better.  She continues to have epigastric pain though this is improved Notes intermittent.  States her reflux is relatively well controlled.  She states all this started when she had her gallbladder removed.  She was trialed on colestipol which did not help her symptoms.  Past Medical History:  Diagnosis Date  . Adenomatous polyp of colon 08/2013   TWO, REDUNDANT LEFT COLON  . H. pylori infection 09/2019   Treated with bismuth subsalicylate, tetracycline, metronidazole, and PPI twice daily x14 days.  . Helicobacter pylori gastritis 08/2013   ABO  BID FOR 10 DAYS  . Sarcoidosis   . Sarcoidosis     Past Surgical History:  Procedure Laterality Date  . APPENDECTOMY    . BIOPSY  10/19/2019   Procedure: BIOPSY;  Surgeon: Eloise Harman, DO;  [facility];  Service: Endoscopy;;  . CHOLECYSTECTOMY  Nov 2014   [facility]  . COLONOSCOPY N/A 08/29/2013   SLF: Several simple adenomas removed, moderate sized internal hemorrhoids, slightly redundant left colon.  . COLONOSCOPY N/A 05/18/2019   Procedure: COLONOSCOPY;  Surgeon: Danie Binder, MD;   Two 5-8 mm polyps in the proximal ascending and distal ascending colon, external hemorrhoids, tortuous left colon.  Pathology with sessile serrated polyps.  Recommended next colonoscopy in 5 years.    . ESOPHAGOGASTRODUODENOSCOPY N/A 08/29/2013   SLF: H. pylori  gastritis treated with amoxicillin/Biaxin/omeprazole  . ESOPHAGOGASTRODUODENOSCOPY (EGD) WITH PROPOFOL N/A 10/19/2019   Procedure: ESOPHAGOGASTRODUODENOSCOPY (EGD) WITH PROPOFOL;  Surgeon: Eloise Harman, DO;  [facility];  Service: Endoscopy;  Laterality: N/A;  1:30pm _patient had positive covid test 09/15/19 in [place], [place], office faxed results-pt to be here at [place]    . POLYPECTOMY  05/18/2019   Procedure: POLYPECTOMY;  Surgeon: Danie Binder, MD;  [facility];  Service: Endoscopy;;  ascending x2  . TUBAL LIGATION      Current Outpatient Medications  Medication Sig Dispense Refill  . acetaminophen (TYLENOL) 500 MG tablet Take 500 mg by mouth every 8 (eight) hours as needed for mild pain or headache.     . Glycerin-Hypromellose-PEG 400 (DRY EYE RELIEF DROPS) 0.2-0.2-1 % SOLN Place 1 drop into both eyes daily as needed (Dry eye).     . Misc Natural Products (ELDERBERRY ZINC/VIT C/IMMUNE MT) Use as directed 1 tablet in the mouth or throat daily. Gummie    . brompheniramine-pseudoephedrine-DM 30-2-10 MG/5ML syrup Take 5 mLs by mouth 4 (four) times daily as needed. (Patient not taking: Reported on 02/14/2020)    . pantoprazole (PROTONIX) 40 MG tablet Take 1 tablet (40 mg total) by mouth 2 (two) times daily. 180 tablet 3   No current facility-administered medications for this visit.    Allergies as of 02/14/2020  . (No Known Allergies)    Family History  Problem Relation Age of  Onset  . Colon cancer Neg Hx     Social History   Socioeconomic History  . Marital status: Married    Spouse name: Not on file  . Number of children: Not on file  . Years of education: Not on file  . Highest education level: Not on file  Occupational History  . Occupation: caregiver  Tobacco Use  . Smoking status: Never Smoker  . Smokeless tobacco: Never Used  Vaping Use  . Vaping Use: Never used  Substance and Sexual Activity  . Alcohol use: No  . Drug  use: No  . Sexual activity: Not on file  Other Topics Concern  . Not on file  Social History Narrative  . Not on file   Social Determinants of Health   Financial Resource Strain: Not on file  Food Insecurity: Not on file  Transportation Needs: Not on file  Physical Activity: Not on file  Stress: Not on file  Social Connections: Not on file    Subjective: Review of Systems  Constitutional: Negative for chills and fever.  HENT: Negative for congestion and hearing loss.   Eyes: Negative for blurred vision and double vision.  Respiratory: Negative for cough and shortness of breath.   Cardiovascular: Negative for chest pain and palpitations.  Gastrointestinal: Positive for abdominal pain and heartburn. Negative for blood in stool, constipation, diarrhea, melena and vomiting.  Genitourinary: Negative for dysuria and urgency.  Musculoskeletal: Negative for joint pain and myalgias.  Skin: Negative for itching and rash.  Neurological: Negative for dizziness and headaches.  Psychiatric/Behavioral: Negative for depression. The patient is not nervous/anxious.      Objective: BP 105/64   Pulse 73   Temp (!) 96.8 F (36 C) (Temporal)   Ht 5' (1.524 m)   Wt 129 lb 9.6 oz (58.8 kg)   BMI 25.31 kg/m  Physical Exam Constitutional:      Appearance: Normal appearance.  HENT:     Head: Normocephalic and atraumatic.  Eyes:     Extraocular Movements: Extraocular movements intact.     Conjunctiva/sclera: Conjunctivae normal.  Cardiovascular:     Rate and Rhythm: Normal rate and regular rhythm.  Pulmonary:     Effort: Pulmonary effort is normal.     Breath sounds: Normal breath sounds.  Abdominal:     General: Bowel sounds are normal.     Palpations: Abdomen is soft.  Musculoskeletal:        General: No swelling. Normal range of motion.     Cervical back: Normal range of motion and neck supple.  Skin:    General: Skin is warm and dry.     Coloration: Skin is not jaundiced.   Neurological:     General: No focal deficit present.     Mental Status: She is alert and oriented to person, place, and time.  Psychiatric:        Mood and Affect: Mood normal.        Behavior: Behavior normal.      Assessment: *H. pylori gastritis-chronic *Functional dyspepsia *Epigastric abdominal pain  Plan: We will need to check eradication of patient's H. pylori with breath testing.  Counseled that she needs to hold her PPI for 2 weeks prior to testing and she understands.  Okay for her to take Pepcid during that timeframe.  After her breath test she can restart her Protonix.  Assuming eradication, we can trial her on different medications for functional dyspepsia such as TCA.  We will discuss on follow-up  visit in 2 to 3 months.  02/14/2020 4:14 PM   Disclaimer: This note was dictated with voice recognition software. Similar sounding words can inadvertently be transcribed and may not be corrected upon review.

## 2020-05-19 ENCOUNTER — Ambulatory Visit: Admitting: Gastroenterology

## 2020-06-03 ENCOUNTER — Encounter: Payer: Self-pay | Admitting: Internal Medicine

## 2020-06-09 ENCOUNTER — Other Ambulatory Visit: Payer: Self-pay

## 2020-06-09 ENCOUNTER — Ambulatory Visit: Admitting: Nurse Practitioner

## 2020-06-09 ENCOUNTER — Encounter: Payer: Self-pay | Admitting: Nurse Practitioner

## 2020-06-09 VITALS — BP 107/67 | HR 64 | Temp 98.4°F | Resp 18 | Ht 60.0 in | Wt 128.0 lb

## 2020-06-09 DIAGNOSIS — R1030 Lower abdominal pain, unspecified: Secondary | ICD-10-CM | POA: Diagnosis not present

## 2020-06-09 DIAGNOSIS — Z139 Encounter for screening, unspecified: Secondary | ICD-10-CM | POA: Diagnosis not present

## 2020-06-09 DIAGNOSIS — Z7689 Persons encountering health services in other specified circumstances: Secondary | ICD-10-CM

## 2020-06-09 DIAGNOSIS — N390 Urinary tract infection, site not specified: Secondary | ICD-10-CM | POA: Insufficient documentation

## 2020-06-09 DIAGNOSIS — N3000 Acute cystitis without hematuria: Secondary | ICD-10-CM | POA: Diagnosis not present

## 2020-06-09 MED ORDER — CIPROFLOXACIN HCL 500 MG PO TABS: 500.0000 mg | ORAL_TABLET | Freq: Two times a day (BID) | ORAL | 0 refills | Status: AC

## 2020-06-09 NOTE — Patient Instructions (Signed)
Please have fasting labs drawn 2-3 days prior to your appointment so we can discuss the results during your office visit.  

## 2020-06-09 NOTE — Progress Notes (Signed)
New Patient Office Visit  Subjective:  Patient ID: Terri Newman, female    DOB: 11/15/1960  Age: 60 y.o. MRN: [id number]  CC:  Chief Complaint  Patient presents with  . New Patient (Initial Visit)    HPI Kazoua Gossen presents for new patient visit. Transferring care from Dr. Luan Pulling. Last physical and labs were done 2 weeks ago and lab (end of April 2022).  She is followed by Dr. Abbey Chatters, GI, for H pylori infection, dyspepsia, and upper abdominal pain.  She is followed by Dr. Melvyn Novas for sarcoidosis. She sees Dr. Lorin Mercy for chronic knee pain and Dr. Annette Stable for neck pain.  She states she has had abdominal pain, back pain, and feels like she has to urinate even after she has just urinated. This started 2 days ago.  Denies malodorous urine, dysuria, or discolored urine.  She sees GYN for mammogram and PAP smear. She is unsure of last dates.  Past Medical History:  Diagnosis Date  . Adenomatous polyp of colon 08/2013   TWO, REDUNDANT LEFT COLON  . Constipation by delayed colonic transit 08/23/2013  . GERD (gastroesophageal reflux disease)    Phreesia 06/06/2020  . H. pylori infection 09/2019   Treated with bismuth subsalicylate, tetracycline, metronidazole, and PPI twice daily x14 days.  . Helicobacter pylori gastritis 08/2013   ABO  BID FOR 10 DAYS  . Neuromuscular disorder ([place])    chronic back pain  . Sarcoidosis   . Sarcoidosis     Past Surgical History:  Procedure Laterality Date  . APPENDECTOMY    . BIOPSY  10/19/2019   Procedure: BIOPSY;  Surgeon: Eloise Harman, DO;  [facility];  Service: Endoscopy;;  . CHOLECYSTECTOMY  Nov 2014   [facility]  . COLONOSCOPY N/A 08/29/2013   SLF: Several simple adenomas removed, moderate sized internal hemorrhoids, slightly redundant left colon.  . COLONOSCOPY N/A 05/18/2019   Procedure: COLONOSCOPY;  Surgeon: Danie Binder, MD;   Two 5-8 mm polyps in the proximal ascending and distal ascending colon, external  hemorrhoids, tortuous left colon.  Pathology with sessile serrated polyps.  Recommended next colonoscopy in 5 years.    . ESOPHAGOGASTRODUODENOSCOPY N/A 08/29/2013   SLF: H. pylori gastritis treated with amoxicillin/Biaxin/omeprazole  . ESOPHAGOGASTRODUODENOSCOPY (EGD) WITH PROPOFOL N/A 10/19/2019   Procedure: ESOPHAGOGASTRODUODENOSCOPY (EGD) WITH PROPOFOL;  Surgeon: Eloise Harman, DO;  [facility];  Service: Endoscopy;  Laterality: N/A;  1:30pm _patient had positive covid test 09/15/19 in [place], [place], office faxed results-pt to be here at [place]    . POLYPECTOMY  05/18/2019   Procedure: POLYPECTOMY;  Surgeon: Danie Binder, MD;  [facility];  Service: Endoscopy;;  ascending x2  . TUBAL LIGATION      Family History  Problem Relation Age of Onset  . Colon cancer Neg Hx     Social History   Socioeconomic History  . Marital status: Legally Separated    Spouse name: Not on file  . Number of children: 3  . Years of education: Not on file  . Highest education level: Not on file  Occupational History  . Occupation: Corporate treasurer    Comment: makes ranch dressing, pasta sauce, salsa, and other food produces  Tobacco Use  . Smoking status: Never Smoker  . Smokeless tobacco: Never Used  Vaping Use  . Vaping Use: Never used  Substance and Sexual Activity  . Alcohol use: No  . Drug use: No  . Sexual  activity: Not Currently  Other Topics Concern  . Not on file  Social History Narrative  . Not on file   Social Determinants of Health   Financial Resource Strain: Not on file  Food Insecurity: Not on file  Transportation Needs: Not on file  Physical Activity: Not on file  Stress: Not on file  Social Connections: Not on file  Intimate Partner Violence: Not on file    ROS Review of Systems  Constitutional: Negative.   Respiratory: Negative.   Cardiovascular: Negative.   Gastrointestinal: Positive for abdominal pain. Negative for blood in  stool, constipation, diarrhea, nausea and vomiting.  Genitourinary: Positive for frequency. Negative for decreased urine volume, difficulty urinating, dysuria, hematuria and urgency.  Psychiatric/Behavioral: Negative.     Objective:   Today's Vitals: BP 107/67   Pulse 64   Temp 98.4 F (36.9 C)   Resp 18   Ht 5' (1.524 m)   Wt 128 lb (58.1 kg)   SpO2 97%   BMI 25.00 kg/m   Physical Exam Constitutional:      Appearance: Normal appearance.  Cardiovascular:     Rate and Rhythm: Normal rate and regular rhythm.     Pulses: Normal pulses.     Heart sounds: Normal heart sounds.  Pulmonary:     Effort: Pulmonary effort is normal.     Breath sounds: Normal breath sounds.  Abdominal:     General: There is no distension.     Palpations: There is no mass.     Tenderness: There is abdominal tenderness. There is no guarding or rebound.     Hernia: No hernia is present.     Comments: Lower abdominal pain below umbilicus  Neurological:     Mental Status: She is alert.     Assessment & Plan:   Problem List Items Addressed This Visit      Genitourinary   UTI (urinary tract infection) - Primary    -U/A today showed large leuks and trace blood -feels like she isn't emptying her bladder fully; started 2 days ago      Relevant Medications   ciprofloxacin (CIPRO) 500 MG tablet   Other Relevant Orders   Urine Culture     Other   Encounter to establish care    -reviewed records briefly from her specialists      Relevant Orders   CBC with Differential/Platelet   CMP14+EGFR   Lipid Panel With LDL/HDL Ratio   Screening due    -will screen for HIV and HCV with next set of labs      Relevant Orders   HIV Antibody (routine testing w rflx)   Hepatitis C Antibody      Outpatient Encounter Medications as of 06/09/2020  Medication Sig  . ciprofloxacin (CIPRO) 500 MG tablet Take 1 tablet (500 mg total) by mouth 2 (two) times daily for 5 days.  . pantoprazole (PROTONIX) 40 MG  tablet Take 1 tablet (40 mg total) by mouth 2 (two) times daily.  . Glycerin-Hypromellose-PEG 400 (DRY EYE RELIEF DROPS) 0.2-0.2-1 % SOLN Place 1 drop into both eyes daily as needed (Dry eye).   . [DISCONTINUED] acetaminophen (TYLENOL) 500 MG tablet Take 500 mg by mouth every 8 (eight) hours as needed for mild pain or headache.   . [DISCONTINUED] brompheniramine-pseudoephedrine-DM 30-2-10 MG/5ML syrup Take 5 mLs by mouth 4 (four) times daily as needed. (Patient not taking: Reported on 02/14/2020)  . [DISCONTINUED] Misc Natural Products (ELDERBERRY ZINC/VIT C/IMMUNE MT) Use as directed 1 tablet  in the mouth or throat daily. Gummie   No facility-administered encounter medications on file as of 06/09/2020.    Follow-up: Return in about 3 months (around 09/09/2020) for Lab follow-up.   Noreene Larsson, NP

## 2020-06-09 NOTE — Assessment & Plan Note (Signed)
-  will screen for HIV and HCV with next set of labs 

## 2020-06-09 NOTE — Assessment & Plan Note (Addendum)
-  U/A today showed large leuks and trace blood -feels like she isn't emptying her bladder fully; started 2 days ago

## 2020-06-09 NOTE — Assessment & Plan Note (Signed)
-  reviewed records briefly from her specialists

## 2020-06-11 NOTE — Progress Notes (Signed)
NOoneed to call yet. Results aren't final. We treated her with cipro.

## 2020-06-14 LAB — URINE CULTURE

## 2020-06-23 NOTE — Progress Notes (Signed)
The cipro that we sent in should have done the job based on urine culture. Is she feeling better?

## 2020-07-24 ENCOUNTER — Other Ambulatory Visit: Payer: Self-pay

## 2020-07-24 ENCOUNTER — Ambulatory Visit (INDEPENDENT_AMBULATORY_CARE_PROVIDER_SITE_OTHER): Admitting: Orthopaedic Surgery

## 2020-07-24 ENCOUNTER — Encounter: Payer: Self-pay | Admitting: Orthopaedic Surgery

## 2020-07-24 DIAGNOSIS — M5126 Other intervertebral disc displacement, lumbar region: Secondary | ICD-10-CM

## 2020-07-24 MED ORDER — GABAPENTIN 100 MG PO CAPS: ORAL_CAPSULE | ORAL | 1 refills | Status: DC

## 2020-07-24 NOTE — Progress Notes (Signed)
Office Visit Note   Patient: Terri Newman           Date of Birth: [birth year]           MRN: [id number] Visit Date: 07/24/2020              Requested by: Noreene Larsson, NP [address]  [address],  [place] [address] PCP: Noreene Larsson, NP   Assessment & Plan: Visit Diagnoses:  1. Protrusion of lumbar intervertebral disc     Plan: We will start patient on some low-dose gabapentin.  Recheck her in a few months.  She will take 100 mg at night and then can increase to 200 mg daily.  I reviewed with her previous MRI scan lumbar which showed some lateral recess stenosis with likely L5 bilateral nerve root involvement with dorsal foot numbness.  Recheck 4 months.  Follow-Up Instructions: Return in about 4 months (around 11/23/2020).   Orders:  No orders of the defined types were placed in this encounter.  Meds ordered this encounter  Medications   gabapentin (NEURONTIN) 100 MG capsule    Sig: TAKE ONE AT NIGHT TIMES 2 WKS THEN TWO TABLETS AT NIGHT    Dispense:  60 capsule    Refill:  1      Procedures: No procedures performed   Clinical Data: No additional findings.   Subjective: Chief Complaint  Patient presents with   burning both feet    HPI 60 year old female with complaints of bilateral dorsal foot burning pain x5 to 6 months.  No known injury.  She has some associated back pain with this which has been constant.  No known injury.  She has not noticed any claudication symptoms.  No problems with stairs.  No response to anti-inflammatories.  Previous MRI scan September 2021 showed degenerative changes C4-5 moderate stenosis multilevel changes C6-7 and 7 T1.  Lumbar MRI 03/15/2018 showed annular fissure disc protrusion L4-5 with short pedicles mild bulging with mild spinal stenosis and mild bilateral lateral recess stenosis.  Review of Systems previous diagnosis of sarcoidosis 2013 mediastinoscopy at [facility].  She was treated with prednisone for 8  months.  COVID August 2021.  Past history of GERD all other systems noncontributory to HPI.   Objective: Vital Signs: Ht 5' (1.524 m)   Wt 130 lb (59 kg)   BMI 25.39 kg/m   Physical Exam Constitutional:      Appearance: She is well-developed.  HENT:     Head: Normocephalic.     Right Ear: External ear normal.     Left Ear: External ear normal. There is no impacted cerumen.  Eyes:     Pupils: Pupils are equal, round, and reactive to light.  Neck:     Thyroid: No thyromegaly.     Trachea: No tracheal deviation.  Cardiovascular:     Rate and Rhythm: Normal rate.  Pulmonary:     Effort: Pulmonary effort is normal.  Abdominal:     Palpations: Abdomen is soft.  Musculoskeletal:     Cervical back: No rigidity.  Skin:    General: Skin is warm and dry.  Neurological:     Mental Status: She is alert and oriented to person, place, and time.  Psychiatric:        Behavior: Behavior normal.    Ortho Exam patient has some mild fungus right great toe.  No paronychia incision.  EHL anterior tib is strong decreased sensation dorsum of the foot pulses are  normal.  Mild bilateral sciatic notch tenderness.  Negative popliteal compression test.  Patient is able to heel and toe walk.  Specialty Comments:  No specialty comments available.  Imaging: No results found.   PMFS History: Patient Active Problem List   Diagnosis Date Noted   Protrusion of lumbar intervertebral disc 07/28/2020   Screening due 06/09/2020   UTI (urinary tract infection) 06/09/2020   Sarcoidosis 01/02/2020   Encounter to establish care 01/02/2020   Synovitis of knee 11/25/2019   Bloating 08/27/2019   GERD (gastroesophageal reflux disease) 08/27/2019   Polyp of ascending colon    H/O adenomatous polyp of colon 02/28/2019   Upper abdominal pain 02/28/2019   Dyspepsia 08/23/2013   Past Medical History:  Diagnosis Date   Adenomatous polyp of colon 08/2013   TWO, REDUNDANT LEFT COLON   Constipation by  delayed colonic transit 08/23/2013   GERD (gastroesophageal reflux disease)    Phreesia 06/06/2020   H. pylori infection 09/2019   Treated with bismuth subsalicylate, tetracycline, metronidazole, and PPI twice daily x14 days.   Helicobacter pylori gastritis 08/2013   ABO  BID FOR 10 DAYS   Neuromuscular disorder ([place])    chronic back pain   Sarcoidosis    Sarcoidosis     Family History  Problem Relation Age of Onset   Colon cancer Neg Hx     Past Surgical History:  Procedure Laterality Date   APPENDECTOMY     BIOPSY  10/19/2019   Procedure: BIOPSY;  Surgeon: Eloise Harman, DO;  [facility];  Service: Endoscopy;;   CHOLECYSTECTOMY  Nov 2014   [facility]   COLONOSCOPY N/A 08/29/2013   SLF: Several simple adenomas removed, moderate sized internal hemorrhoids, slightly redundant left colon.   COLONOSCOPY N/A 05/18/2019   Procedure: COLONOSCOPY;  Surgeon: Danie Binder, MD;   Two 5-8 mm polyps in the proximal ascending and distal ascending colon, external hemorrhoids, tortuous left colon.  Pathology with sessile serrated polyps.  Recommended next colonoscopy in 5 years.     ESOPHAGOGASTRODUODENOSCOPY N/A 08/29/2013   SLF: H. pylori gastritis treated with amoxicillin/Biaxin/omeprazole   ESOPHAGOGASTRODUODENOSCOPY (EGD) WITH PROPOFOL N/A 10/19/2019   Procedure: ESOPHAGOGASTRODUODENOSCOPY (EGD) WITH PROPOFOL;  Surgeon: Eloise Harman, DO;  [facility];  Service: Endoscopy;  Laterality: N/A;  1:30pm _patient had positive covid test 09/15/19 in [place], [place], office faxed results-pt to be here at [place]  05/18/2019   Procedure: POLYPECTOMY;  Surgeon: Danie Binder, MD;  [facility];  Service: Endoscopy;;  ascending x2   TUBAL LIGATION     Social History   Occupational History   Occupation: Corporate treasurer    Comment: makes ranch dressing, pasta sauce, salsa, and other food produces  Tobacco Use   Smoking status: Never    Smokeless tobacco: Never  Vaping Use   Vaping Use: Never used  Substance and Sexual Activity   Alcohol use: No   Drug use: No   Sexual activity: Not Currently

## 2020-07-28 ENCOUNTER — Ambulatory Visit: Admitting: Gastroenterology

## 2020-07-28 DIAGNOSIS — M5126 Other intervertebral disc displacement, lumbar region: Secondary | ICD-10-CM | POA: Insufficient documentation

## 2020-09-09 ENCOUNTER — Ambulatory Visit: Admitting: Nurse Practitioner

## 2020-09-15 ENCOUNTER — Ambulatory Visit: Admitting: Gastroenterology

## 2020-10-15 ENCOUNTER — Ambulatory Visit (INDEPENDENT_AMBULATORY_CARE_PROVIDER_SITE_OTHER): Admitting: Internal Medicine

## 2020-10-15 ENCOUNTER — Other Ambulatory Visit: Payer: Self-pay

## 2020-10-15 ENCOUNTER — Encounter: Payer: Self-pay | Admitting: Internal Medicine

## 2020-10-15 ENCOUNTER — Ambulatory Visit (INDEPENDENT_AMBULATORY_CARE_PROVIDER_SITE_OTHER)

## 2020-10-15 DIAGNOSIS — D869 Sarcoidosis, unspecified: Secondary | ICD-10-CM

## 2020-10-15 DIAGNOSIS — R0789 Other chest pain: Secondary | ICD-10-CM

## 2020-10-15 NOTE — Patient Instructions (Addendum)
Protonix (pantoprazole ) 40 mg Take 30- 60 min before your first and last meals of the day   GERD (REFLUX)  is an extremely common cause of respiratory symptoms just like yours , many times with no obvious heartburn at all.    It can be treated with medication, but also with lifestyle changes including elevation of the head of your bed (ideally with 6 -8inch blocks under the headboard of your bed),  Smoking cessation, avoidance of late meals, excessive alcohol, and avoid fatty foods, chocolate, peppermint, colas, red wine, and acidic juices such as orange juice.  NO MINT OR MENTHOL PRODUCTS SO NO COUGH DROPS  USE SUGARLESS CANDY INSTEAD (Jolley ranchers or Stover's or Life Savers) or even ice chips will also do - the key is to swallow to prevent all throat clearing. NO OIL BASED VITAMINS - use powdered substitutes.  Avoid fish oil when coughing.    We will walk you today   Please remember to go to the  x-ray department  for your tests - we will call you with the results when they are available    Please schedule a follow up office visit in 6 weeks, call sooner if needed - [place]  office

## 2020-10-15 NOTE — Progress Notes (Signed)
Terri Newman, female    DOB: 06-Dec-1960,  MRN: [id number]   Brief patient profile:  50 yowf never smoker dx with sarcoid 2013 by [place]/mediastinoscopy at [facility] for arthritis / doe on pred x 8 months - no eye issues and no flare off prednisone.  Had covid19 August 2021   Minimally sick / never vaccinated but considering   History of Present Illness  01/02/2020  Pulmonary/ 1st office eval/ Melvyn Novas / [facility]  Chief Complaint  Patient presents with   Pulmonary Consult    Former patient of Dr Luan Pulling- Sarcoid. Pt states has had cough and congestion x 5 days.   Dyspnea: power walks x 30 min x 3 x per week s sob  Cough: [facility] in [place] acute uri rec pred did not take / just bromfed/ no abx  Sleep: ok flat bed/ 3 pillows x years  SABA use: none now  No arthritis, rash, or ocular symptoms  Rec I very strongly recommend you get the moderna or pfizer vaccine   My office will be calling to set up primary care with an office here in [place]  And you will need  pneumovax and Tdap    10/15/2020  f/u ov/Gwendelyn Lanting re: sarcoidosis no maint rx  Chief Complaint  Patient presents with   Follow-up    Patient reports having some cough when getting a deep breath,   Dyspnea:  no longer power walking  Cough: dry/ 2 weeks prior/ similar to priors rx does not remember specifics Sleeping: not as aware of the problem lying down  SABA use: none  02: none  Covid status:   vax never / delta 09/2019  Ant chest discomfort midline with deep breath s lateralization  Freq throat clearing "always been a problem" Dizzy x 6 months room spins when  lies down    No obvious other patterns in day to day or daytime variability or assoc excess/ purulent sputum or mucus plugs or hemoptysis or  chest tightness, subjective wheeze or overt sinus or hb symptoms.   Sleeping  without nocturnal  or early am exacerbation  of respiratory  c/o's or need for noct saba. Also denies any obvious fluctuation of symptoms with  weather or environmental changes or other aggravating or alleviating factors except as outlined above   No unusual exposure hx or h/o childhood pna/ asthma or knowledge of premature birth.  Current Allergies, Complete Past Medical History, Past Surgical History, Family History, and Social History were reviewed in [employer] record.  ROS  The following are not active complaints unless bolded Hoarseness, sore throat, dysphagia, dental problems, itching, sneezing,  nasal congestion or discharge of excess mucus or purulent secretions, ear ache,   fever, chills, sweats, unintended wt loss or wt gain, classically pleuritic or exertional cp,  orthopnea pnd or arm/hand swelling  or leg swelling, presyncope, palpitations, abdominal pain, anorexia, nausea, vomiting, diarrhea  or change in bowel habits or change in bladder habits, change in stools or change in urine, dysuria, hematuria,  rash, arthralgias, visual complaints, headache, numbness, weakness or ataxia or problems with walking or coordination,  change in mood or  memory.        Current Meds  Medication Sig   pantoprazole (PROTONIX) 40 MG tablet Take 1 tablet (40 mg total after breakfast             Past Medical History:  Diagnosis Date   Adenomatous polyp of colon 08/2013   TWO, REDUNDANT LEFT COLON  H. pylori infection 09/2019   Treated with bismuth subsalicylate, tetracycline, metronidazole, and PPI twice daily x14 days.   Helicobacter pylori gastritis 08/2013   ABO  BID FOR 10 DAYS   Sarcoidosis    Sarcoidosis          Objective:      Wt Readings from Last 3 Encounters:  10/15/20 125 lb 9.6 oz (57 kg)  07/24/20 130 lb (59 kg)  06/09/20 128 lb (58.1 kg)     Vital signs reviewed  10/15/2020  - Note at rest 02 sats  98% on RA   General appearance:    somber wf hopeless affect    HEENT : pt wearing mask not removed for exam due to covid -19 concerns.    NECK :  without JVD/Nodes/TM/ nl carotid  upstrokes bilaterally   LUNGS: no acc muscle use,  Nl contour chest which is clear to A and P bilaterally without cough on insp or exp maneuvers   CV:  RRR  no s3 or murmur or increase in P2, and no edema   ABD:  soft and nontender with nl inspiratory excursion in the supine position. No bruits or organomegaly appreciated, bowel sounds nl  MS:  Nl gait/ ext warm without deformities, calf tenderness, cyanosis or clubbing No obvious joint restrictions   SKIN: warm and dry without lesions    NEURO:  alert, approp, nl sensorium with  no motor or cerebellar deficits apparent.       CXR PA and Lateral:   10/15/2020 :    I personally reviewed images and agree with radiology impression as follows:    No active cardiopulmonary disease.       Assessment     Outpatient Encounter Medications as of 10/15/2020  Medication Sig   pantoprazole (PROTONIX) 40 MG tablet Take 30- 60 min before your first and last meals of the day

## 2020-10-17 ENCOUNTER — Encounter: Payer: Self-pay | Admitting: Internal Medicine

## 2020-10-17 MED ORDER — PANTOPRAZOLE SODIUM 40 MG PO TBEC: DELAYED_RELEASE_TABLET | ORAL | Status: DC

## 2020-10-17 NOTE — Assessment & Plan Note (Signed)
Dx 2013 by mediastinoscopy/[facility] with ankle arthritis with minimal doe/ resolved p 8 m prednisone and never flared off -  CTa chest  02/05/19 no adenopathy or ILD  - cxr 10/15/2020 wnl   A good rule of thumb is that >95% of pts with active sarcoid in any organ will have some plain cxr changes - on the other hand  if there are active pulmonary symptoms the cxr will look much worse than the patient:  No evidence of either scenario here/ strongly doubt active dz    Pulmonary f/u is prn

## 2020-10-17 NOTE — Assessment & Plan Note (Signed)
Midline s pleuritic or ex component most c/w GERD esp in pt with chronic throat clearing  rec Max rx for gerd with acid suppression and diet  F/u gi prn          Each maintenance medication was reviewed in detail including emphasizing most importantly the difference between maintenance and prns and under what circumstances the prns are to be triggered using an action plan format where appropriate.  Total time for H and P, chart review, counseling,  and generating customized AVS unique to this office visit / same day charting  > 30 min

## 2020-12-02 ENCOUNTER — Ambulatory Visit: Admitting: Internal Medicine

## 2020-12-03 ENCOUNTER — Other Ambulatory Visit: Payer: Self-pay | Admitting: Internal Medicine

## 2020-12-03 ENCOUNTER — Other Ambulatory Visit: Payer: Self-pay

## 2020-12-03 ENCOUNTER — Encounter: Payer: Self-pay | Admitting: Internal Medicine

## 2020-12-03 ENCOUNTER — Telehealth: Payer: Self-pay | Admitting: Nurse Practitioner

## 2020-12-03 ENCOUNTER — Ambulatory Visit (INDEPENDENT_AMBULATORY_CARE_PROVIDER_SITE_OTHER): Admitting: Internal Medicine

## 2020-12-03 VITALS — BP 108/64 | HR 72 | Temp 97.7°F | Ht 60.0 in | Wt 127.2 lb

## 2020-12-03 DIAGNOSIS — K219 Gastro-esophageal reflux disease without esophagitis: Secondary | ICD-10-CM | POA: Diagnosis not present

## 2020-12-03 DIAGNOSIS — B9681 Helicobacter pylori [H. pylori] as the cause of diseases classified elsewhere: Secondary | ICD-10-CM

## 2020-12-03 DIAGNOSIS — R1013 Epigastric pain: Secondary | ICD-10-CM | POA: Diagnosis not present

## 2020-12-03 DIAGNOSIS — R1010 Upper abdominal pain, unspecified: Secondary | ICD-10-CM

## 2020-12-03 DIAGNOSIS — K2970 Gastritis, unspecified, without bleeding: Secondary | ICD-10-CM

## 2020-12-03 MED ORDER — SUCRALFATE 1 G PO TABS: 1.0000 g | ORAL_TABLET | Freq: Three times a day (TID) | ORAL | 5 refills | Status: DC

## 2020-12-03 NOTE — Patient Instructions (Addendum)
We need to ensure that you have eradicated your H. pylori infection.  I am going to order a breath test to be performed at [facility] labs.  You will need to hold your pantoprazole for 14 days prior to test.  During that time you can take over-the-counter famotidine or Tums.  No Pepto-Bismol.  I will send in a new medication called Carafate which you can take up to 4 times a day as needed for abdominal pain.  Further recommendations after I have received your H. pylori testing.  Follow-up with GI in 3 to 4 months.  It was nice seeing you again today.  Dr. Abbey Chatters  At [facility] we value your feedback. You may receive a survey about your visit today. Please share your experience as we strive to create trusting relationships with our patients to provide genuine, compassionate, quality care.  We appreciate your understanding and patience as we review any laboratory studies, imaging, and other diagnostic tests that are ordered as we care for you. Our office policy is 5 business days for review of these results, and any emergent or urgent results are addressed in a timely manner for your best interest. If you do not hear from our office in 1 week, please contact us.   We also encourage the use of MyChart, which contains your medical information for your review as well. If you are not enrolled in this feature, an access code is on this after visit summary for your convenience. Thank you for allowing [place] to be involved in your care.  It was great to see you today!  I hope you have a great rest of your Fall!    Elon Alas. Abbey Chatters, D.O. Gastroenterology and Hepatology [facility]

## 2020-12-03 NOTE — Telephone Encounter (Signed)
Pt called in fr cardio referral  Went to gastro appt  today w/ Abbey Chatters and suggested pt see cardiologist   Pt wants cardio referral

## 2020-12-04 NOTE — Telephone Encounter (Signed)
11/3 2:15 LVM to call and schedule appt

## 2020-12-04 NOTE — Progress Notes (Signed)
Referring Provider: Noreene Larsson, NP Primary Care Physician:  Noreene Larsson, NP Primary GI:  Dr. Abbey Chatters  Chief Complaint  Patient presents with   Chest Pain    Has cough, clears throat. Has seen pulmonary. Had burning in upper abd yesterday    HPI:   Terri Newman is a 60 y.o. female who presents to the clinic today for follow-up visit.  She has a history of H. pylori gastritis which has been treated multiple times going back to 2015.  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  She completed a course of levofloxacin, amoxicillin, and Protonix.  She was supposed to go undergo eradication testing but did not have this done.  States her symptoms improved mildly at first but now having issues again.  Notes feeling of shortness of breath.  Has been seen by pulmonary.  Also having issues with chest pain.  Does note some epigastric burning.  No dysphagia odynophagia.  Past Medical History:  Diagnosis Date   Adenomatous polyp of colon 08/2013   TWO, REDUNDANT LEFT COLON   Constipation by delayed colonic transit 08/23/2013   GERD (gastroesophageal reflux disease)    Phreesia 06/06/2020   H. pylori infection 09/2019   Treated with bismuth subsalicylate, tetracycline, metronidazole, and PPI twice daily x14 days.   Helicobacter pylori gastritis 08/2013   ABO  BID FOR 10 DAYS   Neuromuscular disorder ([place])    chronic back pain   Sarcoidosis    Sarcoidosis     Past Surgical History:  Procedure Laterality Date   APPENDECTOMY     BIOPSY  10/19/2019   Procedure: BIOPSY;  Surgeon: Eloise Harman, DO;  [facility];  Service: Endoscopy;;   CHOLECYSTECTOMY  Nov 2014   [facility]   COLONOSCOPY N/A 08/29/2013   SLF: Several simple adenomas removed, moderate sized internal hemorrhoids, slightly redundant left colon.   COLONOSCOPY N/A 05/18/2019   Procedure: COLONOSCOPY;  Surgeon: Danie Binder, MD;   Two 5-8 mm polyps in the proximal ascending  and distal ascending colon, external hemorrhoids, tortuous left colon.  Pathology with sessile serrated polyps.  Recommended next colonoscopy in 5 years.     ESOPHAGOGASTRODUODENOSCOPY N/A 08/29/2013   SLF: H. pylori gastritis treated with amoxicillin/Biaxin/omeprazole   ESOPHAGOGASTRODUODENOSCOPY (EGD) WITH PROPOFOL N/A 10/19/2019   Procedure: ESOPHAGOGASTRODUODENOSCOPY (EGD) WITH PROPOFOL;  Surgeon: Eloise Harman, DO;  [facility];  Service: Endoscopy;  Laterality: N/A;  1:30pm _patient had positive covid test 09/15/19 in [place], [place], office faxed results-pt to be here at [place]  05/18/2019   Procedure: POLYPECTOMY;  Surgeon: Danie Binder, MD;  [facility];  Service: Endoscopy;;  ascending x2   TUBAL LIGATION      Current Outpatient Medications  Medication Sig Dispense Refill   ibuprofen (ADVIL) 200 MG tablet Take 400 mg by mouth every 6 (six) hours as needed.     pantoprazole (PROTONIX) 40 MG tablet Take 30- 60 min before your first and last meals of the day     sucralfate (CARAFATE) 1 g tablet Take 1 tablet (1 g total) by mouth 4 (four) times daily -  with meals and at bedtime. 120 tablet 5   No current facility-administered medications for this visit.    Allergies as of 12/03/2020   (No Known Allergies)    Family History  Problem Relation Age of Onset   Colon cancer  Neg Hx     Social History   Socioeconomic History   Marital status: Legally Separated    Spouse name: Not on file   Number of children: 3   Years of education: Not on file   Highest education level: Not on file  Occupational History   Occupation: Corporate treasurer    Comment: makes ranch dressing, pasta sauce, salsa, and other food produces  Tobacco Use   Smoking status: Never   Smokeless tobacco: Never  Vaping Use   Vaping Use: Never used  Substance and Sexual Activity   Alcohol use: No   Drug use: No   Sexual activity: Not Currently  Other Topics  Concern   Not on file  Social History Narrative   Not on file   Social Determinants of Health   Financial Resource Strain: Not on file  Food Insecurity: Not on file  Transportation Needs: Not on file  Physical Activity: Not on file  Stress: Not on file  Social Connections: Not on file    Subjective: Review of Systems  Constitutional:  Negative for chills and fever.  HENT:  Negative for congestion and hearing loss.   Eyes:  Negative for blurred vision and double vision.  Respiratory:  Negative for cough and shortness of breath.   Cardiovascular:  Negative for chest pain and palpitations.  Gastrointestinal:  Positive for abdominal pain and heartburn. Negative for blood in stool, constipation, diarrhea, melena and vomiting.  Genitourinary:  Negative for dysuria and urgency.  Musculoskeletal:  Negative for joint pain and myalgias.  Skin:  Negative for itching and rash.  Neurological:  Negative for dizziness and headaches.  Psychiatric/Behavioral:  Negative for depression. The patient is not nervous/anxious.     Objective: BP 108/64   Pulse 72   Temp 97.7 F (36.5 C) (Temporal)   Ht 5' (1.524 m)   Wt 127 lb 3.2 oz (57.7 kg)   BMI 24.84 kg/m  Physical Exam Constitutional:      Appearance: Normal appearance.  HENT:     Head: Normocephalic and atraumatic.  Eyes:     Extraocular Movements: Extraocular movements intact.     Conjunctiva/sclera: Conjunctivae normal.  Cardiovascular:     Rate and Rhythm: Normal rate and regular rhythm.  Pulmonary:     Effort: Pulmonary effort is normal.     Breath sounds: Normal breath sounds.  Abdominal:     General: Bowel sounds are normal.     Palpations: Abdomen is soft.  Musculoskeletal:        General: No swelling. Normal range of motion.     Cervical back: Normal range of motion and neck supple.  Skin:    General: Skin is warm and dry.     Coloration: Skin is not jaundiced.  Neurological:     General: No focal deficit present.      Mental Status: She is alert and oriented to person, place, and time.  Psychiatric:        Mood and Affect: Mood normal.        Behavior: Behavior normal.     Assessment: *H. pylori gastritis-chronic *Functional dyspepsia *Epigastric abdominal pain  Plan: We will need to check eradication of patient's H. pylori with breath testing.  Counseled that she needs to hold her PPI for 2 weeks prior to testing and she understands.  Okay for her to take Pepcid during that timeframe.  After her breath test she can restart her Protonix.  I will add on Carafate to take  up to 4 times a day as needed.  Assuming eradication, we can trial her on different medications for functional dyspepsia such as TCA.  We will discuss on follow-up visit in 2 to 3 months.  Recommend cardiology evaluation given her chest pain.  12/04/2020 11:24 AM   Disclaimer: This note was dictated with voice recognition software. Similar sounding words can inadvertently be transcribed and may not be corrected upon review.

## 2020-12-10 ENCOUNTER — Other Ambulatory Visit: Payer: Self-pay

## 2020-12-10 ENCOUNTER — Encounter: Payer: Self-pay | Admitting: Internal Medicine

## 2020-12-10 ENCOUNTER — Ambulatory Visit (INDEPENDENT_AMBULATORY_CARE_PROVIDER_SITE_OTHER): Admitting: Internal Medicine

## 2020-12-10 VITALS — BP 110/71 | HR 71 | Resp 18 | Ht 60.0 in | Wt 127.0 lb

## 2020-12-10 DIAGNOSIS — R0789 Other chest pain: Secondary | ICD-10-CM | POA: Diagnosis not present

## 2020-12-10 DIAGNOSIS — K219 Gastro-esophageal reflux disease without esophagitis: Secondary | ICD-10-CM

## 2020-12-10 NOTE — Patient Instructions (Signed)
Please take Pepcid for GERD as instructed by your Gastroenterologist. Continue taking Carafate.  You are being referred to Cardiologist for evaluation of chest pain episodes.

## 2020-12-11 NOTE — Assessment & Plan Note (Signed)
Chest pain with palpitations intermittently, at rest Could be related to GERD and/or anxiety, but would refer to Cardiology to r/o cardiac etiology

## 2020-12-11 NOTE — Assessment & Plan Note (Signed)
On Pantoprazole, recently held for H pylori testing Needs to take Pepcid as instructed by GI for now Sucralfate PRN

## 2020-12-11 NOTE — Progress Notes (Signed)
Acute Office Visit  Subjective:    Patient ID: Terri Newman, female    DOB: [birth year], 60 y.o.   MRN: [id number]  Chief Complaint  Patient presents with   Acute Visit    Pt has had sore throat and pain right in middle of chest also a dry cough started 09-01-20 she saw pulmonology also saw gastro is supposed to come back and have h pylori test done wondering if she needs referral to cardio    HPI Patient is in today for complaint of substernal chest pain, which is chronic, intermittent and lasts for few hours.  She also reports chronic throat pain and dry wheezing.  Of note, she has history of GERD and H. pylori, for which she takes pantoprazole usually, but she is planning to get H. pylori testing and has been advised to hold pantoprazole for now.  She has not been taking Pepcid as advised by her GI.  She is taking sucralfate for GERD currently.  Denies any vomiting, dysphagia or odynophagia.  She is concerned about her chest pain and asked for a referral to cardiologist.  I had discussion about her symptoms most likely being related to GI etiology.  But considering intermittent episodes of chest pain at rest, she has been referred to cardiology to rule out cardiac etiology of her chest pain.  Past Medical History:  Diagnosis Date   Adenomatous polyp of colon 08/2013   TWO, REDUNDANT LEFT COLON   Constipation by delayed colonic transit 08/23/2013   GERD (gastroesophageal reflux disease)    [employer] 06/06/2020   H. pylori infection 09/2019   Treated with bismuth subsalicylate, tetracycline, metronidazole, and PPI twice daily x14 days.   Helicobacter pylori gastritis 08/2013   ABO  BID FOR 10 DAYS   Neuromuscular disorder (HCC)    chronic back pain   PONV (postoperative nausea and vomiting) 11/07/2012   Sarcoidosis    Sarcoidosis     Past Surgical History:  Procedure Laterality Date   APPENDECTOMY     BIOPSY  10/19/2019   Procedure: BIOPSY;  Surgeon: Eloise Harman, DO;   [facility];  Service: Endoscopy;;   CHOLECYSTECTOMY  Nov 2014   [facility]   COLONOSCOPY N/A 08/29/2013   SLF: Several simple adenomas removed, moderate sized internal hemorrhoids, slightly redundant left colon.   COLONOSCOPY N/A 05/18/2019   Procedure: COLONOSCOPY;  Surgeon: Danie Binder, MD;   Two 5-8 mm polyps in the proximal ascending and distal ascending colon, external hemorrhoids, tortuous left colon.  Pathology with sessile serrated polyps.  Recommended next colonoscopy in 5 years.     ESOPHAGOGASTRODUODENOSCOPY N/A 08/29/2013   SLF: H. pylori gastritis treated with amoxicillin/Biaxin/omeprazole   ESOPHAGOGASTRODUODENOSCOPY (EGD) WITH PROPOFOL N/A 10/19/2019   Procedure: ESOPHAGOGASTRODUODENOSCOPY (EGD) WITH PROPOFOL;  Surgeon: Eloise Harman, DO;  [facility];  Service: Endoscopy;  Laterality: N/A;  1:30pm _patient had positive covid test 09/15/19 in [place], [place], office faxed results-pt to be here at [place]     POLYPECTOMY  05/18/2019   Procedure: POLYPECTOMY;  Surgeon: Danie Binder, MD;  [facility];  Service: Endoscopy;;  ascending x2   TUBAL LIGATION      Family History  Problem Relation Age of Onset   Colon cancer Neg Hx     Social History   Socioeconomic History   Marital status: Legally Separated    Spouse name: Not on file   Number of children: 3   Years  of education: Not on file   Highest education level: Not on file  Occupational History   Occupation: Corporate treasurer    Comment: makes ranch dressing, pasta sauce, salsa, and other food produces  Tobacco Use   Smoking status: Never   Smokeless tobacco: Never  Vaping Use   Vaping Use: Never used  Substance and Sexual Activity   Alcohol use: No   Drug use: No   Sexual activity: Not Currently  Other Topics Concern   Not on file  Social History Narrative   Not on file   Social Determinants of Health   Financial Resource Strain: Not on file  Food Insecurity:  Not on file  Transportation Needs: Not on file  Physical Activity: Not on file  Stress: Not on file  Social Connections: Not on file  Intimate Partner Violence: Not on file    Outpatient Medications Prior to Visit  Medication Sig Dispense Refill   ibuprofen (ADVIL) 200 MG tablet Take 400 mg by mouth every 6 (six) hours as needed.     pantoprazole (PROTONIX) 40 MG tablet Take 30- 60 min before your first and last meals of the day     sucralfate (CARAFATE) 1 g tablet Take 1 tablet (1 g total) by mouth 4 (four) times daily -  with meals and at bedtime. 120 tablet 5   No facility-administered medications prior to visit.    No Known Allergies  Review of Systems  Constitutional:  Negative for chills and fever.  HENT:  Positive for sore throat. Negative for congestion, sinus pressure and sinus pain.   Eyes:  Negative for pain and discharge.  Respiratory:  Negative for cough and shortness of breath.   Cardiovascular:  Positive for chest pain. Negative for palpitations.  Gastrointestinal:  Negative for constipation, diarrhea, nausea and vomiting.  Endocrine: Negative for polydipsia and polyuria.  Genitourinary:  Negative for dysuria and hematuria.  Musculoskeletal:  Negative for neck pain and neck stiffness.  Skin:  Negative for rash.  Neurological:  Negative for dizziness and weakness.  Psychiatric/Behavioral:  Negative for agitation and behavioral problems.       Objective:    Physical Exam Vitals reviewed.  Constitutional:      General: She is not in acute distress.    Appearance: She is not diaphoretic.  HENT:     Head: Normocephalic and atraumatic.     Nose: Nose normal.     Mouth/Throat:     Mouth: Mucous membranes are moist.  Eyes:     General: No scleral icterus.    Extraocular Movements: Extraocular movements intact.  Cardiovascular:     Rate and Rhythm: Normal rate and regular rhythm.     Pulses: Normal pulses.     Heart sounds: Normal heart sounds. No murmur  heard. Pulmonary:     Breath sounds: Normal breath sounds. No wheezing or rales.  Abdominal:     Palpations: Abdomen is soft.     Tenderness: There is no abdominal tenderness.  Musculoskeletal:     Cervical back: Neck supple. No tenderness.     Right lower leg: No edema.     Left lower leg: No edema.  Skin:    General: Skin is warm.     Findings: No rash.  Neurological:     General: No focal deficit present.     Mental Status: She is alert and oriented to person, place, and time.  Psychiatric:        Mood and Affect: Mood normal.  Behavior: Behavior normal.    BP 110/71 (BP Location: Left Arm, Patient Position: Sitting, Cuff Size: Normal)   Pulse 71   Resp 18   Ht 5' (1.524 m)   Wt 127 lb 0.6 oz (57.6 kg)   SpO2 97%   BMI 24.81 kg/m  Wt Readings from Last 3 Encounters:  12/10/20 127 lb 0.6 oz (57.6 kg)  12/03/20 127 lb 3.2 oz (57.7 kg)  10/15/20 125 lb 9.6 oz (57 kg)        Assessment & Plan:   Problem List Items Addressed This Visit       Digestive   GERD (gastroesophageal reflux disease) - Primary    On Pantoprazole, recently held for H pylori testing Needs to take Pepcid as instructed by GI for now Sucralfate PRN        Other   Atypical chest pain    Chest pain with palpitations intermittently, at rest Could be related to GERD and/or anxiety, but would refer to Cardiology to r/o cardiac etiology      Relevant Orders   Ambulatory referral to Cardiology     No orders of the defined types were placed in this encounter.    Lindell Spar, MD

## 2020-12-22 LAB — H. PYLORI BREATH TEST: H. pylori Breath Test: NOT DETECTED

## 2021-01-01 NOTE — Progress Notes (Signed)
Cardiology Office Note:   Date:  01/02/2021  NAME:  Terri Newman    MRN: [id number] DOB:  May 10, 1960   PCP:  Noreene Larsson, NP  Cardiologist:  None  Electrophysiologist:  None   Referring MD: Lindell Spar, MD   Chief Complaint  Patient presents with   Chest Pain    History of Present Illness:   Terri Newman is a 60 y.o. female with a hx of GERD who is being seen today for the evaluation of chest pain at the request of Noreene Larsson, NP.  She reports for the last 6 months she had a constellation of symptoms including dry cough as well as sharp pain in the center of her chest that is worse with inspiration.  She reports she was getting pain in her chest every few days.  She was taking a vitamin supplement and stopped this and it has gotten better.  She can report some pain with deep inspiration.  Her chest discomfort is not exertional.  It is not alleviated by rest.  It simply occurs with deep inspiration.  Also appears to be exacerbated by chronic cough.  She was actually sent to see her gastroenterologist by her pulmonologist for chronic cough.  The chronic cough was thought to be acid reflux related.  She reports a dry hacking cough in the mornings.  Last 15 to 20 minutes.  She reports it is rather bothersome.  She is not on an ACE inhibitor.  She is on no medication that would cause this.  She reports she may suffer from seasonal allergies and this needs to be reevaluated.  She does have a history of sarcoidosis but this is quiescent and not being treated.  There is no strong family history of heart disease.  She reports her parents died of heart attacks but they were alcoholics and smokers.  She does not smoke or drink alcohol or use drugs.  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  She reports she can walk and do activity all day with no limitations.  Her EKG in office demonstrates an RSR prime pattern which is normal. CT PE study dated 02/05/2019 was reviewed in office.  This  study demonstrates no coronary calcium.  T chol 213, HDL 49, LDL 148, TG 67  Past Medical History: Past Medical History:  Diagnosis Date   Adenomatous polyp of colon 08/2013   TWO, REDUNDANT LEFT COLON   Constipation by delayed colonic transit 08/23/2013   GERD (gastroesophageal reflux disease)    [employer] 06/06/2020   H. pylori infection 09/2019   Treated with bismuth subsalicylate, tetracycline, metronidazole, and PPI twice daily x14 days.   Helicobacter pylori gastritis 08/2013   ABO  BID FOR 10 DAYS   Neuromuscular disorder (HCC)    chronic back pain   PONV (postoperative nausea and vomiting) 11/07/2012   Sarcoidosis    Sarcoidosis     Past Surgical History: Past Surgical History:  Procedure Laterality Date   APPENDECTOMY     BIOPSY  10/19/2019   Procedure: BIOPSY;  Surgeon: Eloise Harman, DO;  [facility];  Service: Endoscopy;;   CHOLECYSTECTOMY  Nov 2014   [facility]   COLONOSCOPY N/A 08/29/2013   SLF: Several simple adenomas removed, moderate sized internal hemorrhoids, slightly redundant left colon.   COLONOSCOPY N/A 05/18/2019   Procedure: COLONOSCOPY;  Surgeon: Danie Binder, MD;   Two 5-8 mm polyps in the proximal ascending and distal ascending colon, external hemorrhoids, tortuous  left colon.  Pathology with sessile serrated polyps.  Recommended next colonoscopy in 5 years.     ESOPHAGOGASTRODUODENOSCOPY N/A 08/29/2013   SLF: H. pylori gastritis treated with amoxicillin/Biaxin/omeprazole   ESOPHAGOGASTRODUODENOSCOPY (EGD) WITH PROPOFOL N/A 10/19/2019   Procedure: ESOPHAGOGASTRODUODENOSCOPY (EGD) WITH PROPOFOL;  Surgeon: Eloise Harman, DO;  [facility];  Service: Endoscopy;  Laterality: N/A;  1:30pm _patient had positive covid test 09/15/19 in [place], [place], office faxed results-pt to be here at [place]  05/18/2019   Procedure: POLYPECTOMY;  Surgeon: Danie Binder, MD;  [facility];  Service: Endoscopy;;   ascending x2   TUBAL LIGATION      Current Medications: Current Meds  Medication Sig   ibuprofen (ADVIL) 200 MG tablet Take 400 mg by mouth every 6 (six) hours as needed.   pantoprazole (PROTONIX) 40 MG tablet Take 30- 60 min before your first and last meals of the day     Allergies:    Patient has no known allergies.   Social History: Social History   Socioeconomic History   Marital status: Married    Spouse name: Not on file   Number of children: 3   Years of education: Not on file   Highest education level: Not on file  Occupational History   Occupation: Corporate treasurer    Comment: makes ranch dressing, pasta sauce, salsa, and other food produces   Occupation: Company secretary  Tobacco Use   Smoking status: Never   Smokeless tobacco: Never  Vaping Use   Vaping Use: Never used  Substance and Sexual Activity   Alcohol use: No   Drug use: No   Sexual activity: Not Currently  Other Topics Concern   Not on file  Social History Narrative   Not on file   Social Determinants of Health   Financial Resource Strain: Not on file  Food Insecurity: Not on file  Transportation Needs: Not on file  Physical Activity: Not on file  Stress: Not on file  Social Connections: Not on file     Family History: The patient's family history includes Alcohol abuse in her father and mother; Heart attack in her father and mother. There is no history of Colon cancer.  ROS:   All other ROS reviewed and negative. Pertinent positives noted in the HPI.     EKGs/Labs/Other Studies Reviewed:   The following studies were personally reviewed by me today:  EKG:  EKG is  ordered today.  The ekg ordered today demonstrates normal sinus rhythm heart rate 68, no acute ischemic changes or evidence of infarction, RSR prime pattern which is normal, and was personally reviewed by me.   Recent Labs: No results found for requested labs within last 8760 hours.   Recent Lipid Panel    Component  Value Date/Time   CHOL 213 08/05/2017 0746   TRIG 67 08/05/2017 0746   HDL 49 08/05/2017 0746   CHOLHDL 4.3 08/05/2017 0746   LDLCALC 148 08/05/2017 0746    Physical Exam:   VS:  BP 108/68   Pulse 68   Ht 5' (1.524 m)   Wt 128 lb (58.1 kg)   SpO2 98%   BMI 25.00 kg/m    Wt Readings from Last 3 Encounters:  01/02/21 128 lb (58.1 kg)  12/10/20 127 lb 0.6 oz (57.6 kg)  12/03/20 127 lb 3.2 oz (57.7 kg)    General: Well nourished, well developed, in no acute distress  Head: Atraumatic, normal size  Eyes: PEERLA, EOMI  Neck: Supple, no JVD Endocrine: No thryomegaly Cardiac: Normal S1, S2; RRR; no murmurs, rubs, or gallops Lungs: Clear to auscultation bilaterally, no wheezing, rhonchi or rales  Abd: Soft, nontender, no hepatomegaly  Ext: No edema, pulses 2+ Musculoskeletal: No deformities, BUE and BLE strength normal and equal Skin: Warm and dry, no rashes   Neuro: Alert and oriented to person, place, time, and situation, CNII-XII grossly intact, no focal deficits  Psych: Normal mood and affect   ASSESSMENT:   Terri Newman is a 60 y.o. female who presents for the following: 1. Precordial pain   2. Chronic cough   3. Sarcoidosis     PLAN:   1. Precordial pain 2. Chronic cough 3. Sarcoidosis -She presents with 6 months of chronic cough as well as pleuritic chest pain with deep inspiration.  The pain is actually improved with stopping several vitamins.  I believe this could be acid reflux related.  I think the bigger issue for her is her chronic cough.  Her chest pain symptoms are noncardiac by her description.  Her EKG is normal in office today.  I did review her CT scan from 2021 one of her chest which demonstrates no evidence of coronary calcium.  Overall she is very low risk and her symptoms are inconsistent with cardiac pathology.  I think she should be reevaluated by pulmonary and we will refer her today.  I believe her chronic cough could be allergy related or possibly  due to her sarcoidosis.  She will see pulmonary about this.  Given normal exam, normal EKG and symptoms that are clearly noncardiac I see no need for further testing.  Disposition: Return if symptoms worsen or fail to improve.  Medication Adjustments/Labs and Tests Ordered: Current medicines are reviewed at length with the patient today.  Concerns regarding medicines are outlined above.  Orders Placed This Encounter  Procedures   Ambulatory referral to Pulmonology   No orders of the defined types were placed in this encounter.   Patient Instructions  Medication Instructions:  Your physician recommends that you continue on your current medications as directed. Please refer to the Current Medication list given to you today.  *If you need a refill on your cardiac medications before your next appointment, please call your pharmacy*   Lab Work: NONE   If you have labs (blood work) drawn today and your tests are completely normal, you will receive your results only by: [place] (if you have MyChart) OR A paper copy in the mail If you have any lab test that is abnormal or we need to change your treatment, we will call you to review the results.   Testing/Procedures: NONE    Follow-Up: At [facility], you and your health needs are our priority.  As part of our continuing mission to provide you with exceptional heart care, we have created designated Provider Care Teams.  These Care Teams include your primary Cardiologist (physician) and Advanced Practice Providers (APPs -  Physician Assistants and Nurse Practitioners) who all work together to provide you with the care you need, when you need it.  We recommend signing up for the patient portal called "MyChart".  Sign up information is provided on this After Visit Summary.  MyChart is used to connect with patients for Virtual Visits (Telemedicine).  Patients are able to view lab/test results, encounter notes, upcoming appointments,  etc.  Non-urgent messages can be sent to your provider as well.  To learn more about what you can do with MyChart, go to [email or web address].    Your next appointment:    As Needed   The format for your next appointment:   In Person  Provider:   Dr. Audie Box      Other Instructions Thank you for choosing [facility]!      Signed, Addison Naegeli. Audie Box, MD, [place]  [address], [place], [place] [address] [phone]  01/02/2021 11:57 AM

## 2021-01-02 ENCOUNTER — Telehealth: Payer: Self-pay | Admitting: Pulmonary Disease

## 2021-01-02 ENCOUNTER — Ambulatory Visit (INDEPENDENT_AMBULATORY_CARE_PROVIDER_SITE_OTHER): Admitting: Cardiovascular Disease

## 2021-01-02 ENCOUNTER — Encounter: Payer: Self-pay | Admitting: Cardiovascular Disease

## 2021-01-02 VITALS — BP 108/68 | HR 68 | Ht 60.0 in | Wt 128.0 lb

## 2021-01-02 DIAGNOSIS — D869 Sarcoidosis, unspecified: Secondary | ICD-10-CM

## 2021-01-02 DIAGNOSIS — R072 Precordial pain: Secondary | ICD-10-CM | POA: Diagnosis not present

## 2021-01-02 DIAGNOSIS — R053 Chronic cough: Secondary | ICD-10-CM | POA: Diagnosis not present

## 2021-01-02 NOTE — Telephone Encounter (Signed)
Please schedule patient with me as a new patient visit (30 min). I am available in December if she is ok with that.  Previously Dr. Gustavus Bryant patient. He is ok with patient switching provider.

## 2021-01-02 NOTE — Patient Instructions (Signed)
Medication Instructions:  Your physician recommends that you continue on your current medications as directed. Please refer to the Current Medication list given to you today.  *If you need a refill on your cardiac medications before your next appointment, please call your pharmacy*   Lab Work: NONE   If you have labs (blood work) drawn today and your tests are completely normal, you will receive your results only by: [place] (if you have MyChart) OR A paper copy in the mail If you have any lab test that is abnormal or we need to change your treatment, we will call you to review the results.   Testing/Procedures: NONE    Follow-Up: At [facility], you and your health needs are our priority.  As part of our continuing mission to provide you with exceptional heart care, we have created designated Provider Care Teams.  These Care Teams include your primary Cardiologist (physician) and Advanced Practice Providers (APPs -  Physician Assistants and Nurse Practitioners) who all work together to provide you with the care you need, when you need it.  We recommend signing up for the patient portal called "MyChart".  Sign up information is provided on this After Visit Summary.  MyChart is used to connect with patients for Virtual Visits (Telemedicine).  Patients are able to view lab/test results, encounter notes, upcoming appointments, etc.  Non-urgent messages can be sent to your provider as well.   To learn more about what you can do with MyChart, go to [email or web address].    Your next appointment:    As Needed   The format for your next appointment:   In Person  Provider:   Dr. Audie Box      Other Instructions Thank you for choosing [facility]!

## 2021-01-02 NOTE — Addendum Note (Signed)
Addended by: Barbarann Ehlers A on: 01/02/2021 01:32 PM   Modules accepted: Orders

## 2021-01-02 NOTE — Telephone Encounter (Signed)
I have called patient and verified DOB and she is aware of the new appointment.Nothing further needed.

## 2021-01-02 NOTE — Telephone Encounter (Signed)
-----   Message from Tanda Rockers, MD sent at 01/02/2021  2:12 PM EST ----- Regarding: RE: Sarcoid patient Absolutely fine - I've sent you two others in the meantime re steroid sparing options  Thx! ----- Message ----- From: Margaretha Seeds, MD Sent: 01/02/2021   1:54 PM EST To: Tanda Rockers, MD Subject: Sarcoid patient                                Cardiology contacted me regarding one of your sarcoid patients. Would you be ok with her switching to my clinic?

## 2021-01-09 ENCOUNTER — Other Ambulatory Visit: Payer: Self-pay

## 2021-01-09 ENCOUNTER — Encounter: Payer: Self-pay | Admitting: Pulmonary Disease

## 2021-01-09 ENCOUNTER — Ambulatory Visit (INDEPENDENT_AMBULATORY_CARE_PROVIDER_SITE_OTHER): Admitting: Pulmonary Disease

## 2021-01-09 VITALS — BP 102/60 | HR 90 | Temp 98.3°F | Ht 60.0 in | Wt 129.6 lb

## 2021-01-09 DIAGNOSIS — D869 Sarcoidosis, unspecified: Secondary | ICD-10-CM | POA: Diagnosis not present

## 2021-01-09 NOTE — Patient Instructions (Addendum)
Chronic cough - unclear if sarcoid related. Will need to rule out other causes ORDER pulmonary function tests in [facility] START flonase 1-2 sprays nightly CONTINUE protonix 40 mg daily per GI If not improved on above management and PFTs negative, will refer to ENT  Follow-up January 17 at 11:45 AM

## 2021-01-09 NOTE — Progress Notes (Signed)
Subjective:   PATIENT ID: Terri Newman GENDER: female DOB: 1960/05/11, MRN: [id number]   HPI  Chief Complaint  Patient presents with   Consult    Patient is here to talk about a cough she's had for 5 months.    Reason for Visit: New consult for sarcoid  [name] is a 60 year old female with sarcoidosis, GERD, hx H. Pylori who presents for chronic cough.  She was recently seen by Cardiology for chest pain evaluation. She has pain with deep inspiration, chronic cough. After evaluation, symptoms thought to be reflux related rather than cardiac. With her history of sarcoid she was sent to Pulmonary for further management for chronic cough.  She was previously seen by Dr. Melvyn Novas since 01/2020 for sarcoid management. Was followed by Luan Pulling before this. Last visit note on 10/2020 was reviewed. She was diagnosed with sarcoidosis in 2013 via mediastinoscopy at [facility] She was treated with prednisone for 8 months with no recurrence off steroids. She had reported unproductive cough on this visit. Prescribed PPI for possible reflux.  Today, she reports has had chronic nonproductive cough for last five months. She will have cough spells that will suddenly start. She occasionally has nasal congestion. She saw GI last month and work-up was negative including neg H.pylori, will follow-up next year. Cough does not interfere with her activities. She reports fatigue but attributes this to sleeping five hours a night.  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  Past Medical History:  Diagnosis Date   Adenomatous polyp of colon 08/2013   TWO, REDUNDANT LEFT COLON   Constipation by delayed colonic transit 08/23/2013   GERD (gastroesophageal reflux disease)    Phreesia 06/06/2020   H. pylori  infection 09/2019   Treated with bismuth subsalicylate, tetracycline, metronidazole, and PPI twice daily x14 days.   Helicobacter pylori gastritis 08/2013   ABO  BID FOR 10 DAYS   Neuromuscular disorder (HCC)    chronic back pain   PONV (postoperative nausea and vomiting) 11/07/2012   Sarcoidosis    Sarcoidosis      Family History  Problem Relation Age of Onset   Heart attack Mother    Alcohol abuse Mother    Heart attack Father    Alcohol abuse Father    Colon cancer Neg Hx      Social History   Occupational History   Occupation: Corporate treasurer    Comment: makes ranch dressing, pasta sauce, salsa, and other food produces   Occupation: Company secretary  Tobacco Use   Smoking status: Never   Smokeless tobacco: Never  Vaping Use   Vaping Use: Never used  Substance and Sexual Activity   Alcohol use: No   Drug use: No   Sexual activity: Not Currently    No Known Allergies   Outpatient Medications Prior to Visit  Medication Sig Dispense Refill   ibuprofen (ADVIL) 200 MG tablet Take 400 mg by mouth every 6 (six) hours as needed.     pantoprazole (PROTONIX) 40 MG tablet Take 30- 60 min before your first and last meals of the day     No facility-administered medications prior to visit.    Review of Systems  Constitutional:  Negative for chills, diaphoresis, fever, malaise/fatigue and weight loss.  HENT:  Negative for congestion.   Respiratory:  Positive for cough. Negative  for hemoptysis, sputum production, shortness of breath and wheezing.   Cardiovascular:  Negative for chest pain, palpitations and leg swelling.  Musculoskeletal:  Positive for joint pain.    Objective:   Vitals:   01/09/21 1532  BP: 102/60  Pulse: 90  Temp: 98.3 F (36.8 C)  TempSrc: Oral  SpO2: 97%  Weight: 129 lb 9.6 oz (58.8 kg)  Height: 5' (1.524 m)   SpO2: 97 % O2 Device: None (Room air)  Physical Exam: General: Well-appearing, no acute distress HENT: [place], AT Eyes: EOMI, no  scleral icterus Respiratory: Mild bibasilar rhonchi. No crackles or wheezing Cardiovascular: RRR, -M/R/G, no JVD Extremities:-Edema,-tenderness Neuro: AAO x4, CNII-XII grossly intact Psych: Normal mood, normal affect  Data Reviewed:  Imaging: CXR 10/15/20 - Normal. No adenopathy. No infiltrate effusion or edema  PFT: 01/21/14 FVC 2.65 (84%) FEV1 2.36 (95%) Ratio 86  TLC 92% DLCO 82% Interpretation: Normal spirometry  Labs: CBC    Component Value Date/Time   WBC 5.2 09/07/2019 0852   RBC 4.34 09/07/2019 0852   HGB 12.6 09/07/2019 0852   HCT 38.5 09/07/2019 0852   HCT 37 08/05/2017 0746   PLT 225 09/07/2019 0852   MCV 88.7 09/07/2019 0852   MCV 88.6 08/05/2017 0746   MCH 29.0 09/07/2019 0852   MCHC 32.7 09/07/2019 0852   RDW 12.9 09/07/2019 0852   RDW 12.6 08/05/2017 0746   LYMPHSABS 2,330 09/07/2019 0852   EOSABS 62 09/07/2019 0852   EOSABS 80 08/05/2017 0746   BASOSABS 21 09/07/2019 0852   CMP Latest Ref Rng & Units 09/07/2019 02/05/2019 08/05/2017  Glucose 65 - 99 mg/dL 97 101(H) -  BUN 7 - 25 mg/dL 14 17 17   Creatinine 0.50 - 1.05 mg/dL 0.80 1.00 0.82  Sodium 135 - 146 mmol/L 140 139 138  Potassium 3.5 - 5.3 mmol/L 4.7 4.2 4.5  Chloride 98 - 110 mmol/L 107 105 104  CO2 20 - 32 mmol/L 30 28 29   Calcium 8.6 - 10.4 mg/dL 9.8 9.4 9.5  Total Protein 6.1 - 8.1 g/dL 7.5 - 7.0  Total Bilirubin 0.2 - 1.2 mg/dL 0.5 - 0.4  Alkaline Phos - - - 75  AST 10 - 35 U/L 15 - 13  ALT 6 - 29 U/L 9 - 12   EKG: 01/02/21 NSR  Assessment & Plan:   Discussion: 60 year old female with sarcoidosis, GERD, hx H. Pylori who presents for chronic cough x 5 months. Common causes of cough were discussed including upper airway cough syndrome, reflux and undiagnosed obstructive lung disease. Low suspicion this is related to her sarcoidosis at this point however would be reasonable to repeat PFTs to rule out obstructive/restrictive lung defect. She may have chest congestion with need for pulmonary  hygiene. She declined bronchodilator trial, wishing to minimize medication intake.  Pulmonary sarcoidosis  - Dx in 2013 via mediastinoscopy - S/p steroids 2013. She is hesitant to restart steroids due to side effects (agitation, sleep, weight gain) - Annual PFTs. Last PFTs 2015 - Recommend annual ophthalmology exam.  Last visit unknown - EKG  reviewed. No evidence of conduction abnormalities.   Chronic cough - unclear if sarcoid related. Will need to rule out other causes - ORDER pulmonary function tests in [facility] - START flonase 1-2 sprays nightly - CONTINUE protonix 40 mg daily per GI - RECOMMEND mucinex BID for 1-2 weeks for chest congestion - If not improved on above management and PFTs negative, will refer to ENT  Follow-up January 17 at  11:45 AM  Health Maintenance Immunization History  Administered Date(s) Administered   Influenza-Unspecified 01/16/2014, 12/03/2014, 12/03/2019, 11/22/2020   Pneumococcal Polysaccharide-23 02/27/2013   CT Lung Screen - never smoker  Orders Placed This Encounter  Procedures   Pulmonary Function Test    Standing Status:   Future    Standing Expiration Date:   01/09/2022    Order Specific Question:   Where should this test be performed?    Answer:   [facility]    Order Specific Question:   Full PFT: includes the following: basic spirometry, spirometry pre & post bronchodilator, diffusion capacity (DLCO), lung volumes    Answer:   Full PFT   No orders of the defined types were placed in this encounter.   Return in about 6 weeks (around 02/17/2021).  I have spent a total time of 38-minutes on the day of the appointment reviewing prior documentation, coordinating care and discussing medical diagnosis and plan with the patient/family. Imaging, labs and tests included in this note have been reviewed and interpreted independently by me.  [place], MD [facility] 01/09/2021 4:50 PM  Office Number  [phone]

## 2021-01-12 ENCOUNTER — Ambulatory Visit: Admitting: Gastroenterology

## 2021-02-06 ENCOUNTER — Other Ambulatory Visit (HOSPITAL_COMMUNITY)
Admission: RE | Admit: 2021-02-06 | Discharge: 2021-02-06 | Disposition: A | Discharge status: Home / Self Care | Source: Ambulatory Visit | Attending: Pulmonary Disease | Admitting: Pulmonary Disease

## 2021-02-06 DIAGNOSIS — Z20822 Contact with and (suspected) exposure to covid-19: Secondary | ICD-10-CM | POA: Insufficient documentation

## 2021-02-06 DIAGNOSIS — Z01812 Encounter for preprocedural laboratory examination: Secondary | ICD-10-CM | POA: Diagnosis not present

## 2021-02-06 DIAGNOSIS — Z01818 Encounter for other preprocedural examination: Secondary | ICD-10-CM

## 2021-02-07 LAB — SARS CORONAVIRUS 2 (TAT 6-24 HRS): SARS Coronavirus 2: NEGATIVE

## 2021-02-10 ENCOUNTER — Ambulatory Visit (HOSPITAL_COMMUNITY)
Admission: RE | Admit: 2021-02-10 | Discharge: 2021-02-10 | Disposition: A | Discharge status: Home / Self Care | Source: Ambulatory Visit | Attending: Pulmonary Disease | Admitting: Pulmonary Disease

## 2021-02-10 ENCOUNTER — Other Ambulatory Visit: Payer: Self-pay

## 2021-02-10 DIAGNOSIS — D869 Sarcoidosis, unspecified: Secondary | ICD-10-CM

## 2021-02-10 LAB — PULMONARY FUNCTION TEST
DL/VA % pred: 90 %
DL/VA: 3.92 ml/min/mmHg/L
DLCO unc % pred: 54 %
DLCO unc: 9.6 ml/min/mmHg
FEF 25-75 Post: 2.42 L/sec
FEF 25-75 Pre: 1.91 L/sec
FEF2575-%Change-Post: 26 %
FEF2575-%Pred-Post: 111 %
FEF2575-%Pred-Pre: 87 %
FEV1-%Change-Post: 3 %
FEV1-%Pred-Post: 70 %
FEV1-%Pred-Pre: 67 %
FEV1-Post: 1.55 L
FEV1-Pre: 1.5 L
FEV1FVC-%Change-Post: 2 %
FEV1FVC-%Pred-Pre: 112 %
FEV6-%Change-Post: 0 %
FEV6-%Pred-Post: 62 %
FEV6-%Pred-Pre: 61 %
FEV6-Post: 1.72 L
FEV6-Pre: 1.71 L
FEV6FVC-%Change-Post: 0 %
FEV6FVC-%Pred-Post: 103 %
FEV6FVC-%Pred-Pre: 103 %
FVC-%Change-Post: 0 %
FVC-%Pred-Post: 60 %
FVC-%Pred-Pre: 59 %
FVC-Post: 1.72 L
FVC-Pre: 1.71 L
Post FEV1/FVC ratio: 90 %
Post FEV6/FVC ratio: 100 %
Pre FEV1/FVC ratio: 88 %
Pre FEV6/FVC Ratio: 100 %
RV % pred: 95 %
RV: 1.69 L
TLC % pred: 76 %
TLC: 3.43 L

## 2021-02-10 MED ORDER — ALBUTEROL SULFATE (2.5 MG/3ML) 0.083% IN NEBU
2.5000 mg | INHALATION_SOLUTION | Freq: Once | RESPIRATORY_TRACT | Status: AC
  Administered 2021-02-10: 2.5 mg via RESPIRATORY_TRACT

## 2021-02-17 ENCOUNTER — Other Ambulatory Visit: Payer: Self-pay

## 2021-02-17 ENCOUNTER — Encounter: Payer: Self-pay | Admitting: Pulmonary Disease

## 2021-02-17 ENCOUNTER — Ambulatory Visit (INDEPENDENT_AMBULATORY_CARE_PROVIDER_SITE_OTHER): Admitting: Pulmonary Disease

## 2021-02-17 VITALS — BP 98/68 | HR 68 | Temp 97.7°F | Ht 60.0 in | Wt 126.0 lb

## 2021-02-17 DIAGNOSIS — D869 Sarcoidosis, unspecified: Secondary | ICD-10-CM

## 2021-02-17 DIAGNOSIS — F419 Anxiety disorder, unspecified: Secondary | ICD-10-CM | POA: Diagnosis not present

## 2021-02-17 MED ORDER — PAROXETINE HCL 10 MG PO TABS: 10.0000 mg | ORAL_TABLET | Freq: Every day | ORAL | 2 refills | Status: DC

## 2021-02-17 MED ORDER — ALBUTEROL SULFATE HFA 108 (90 BASE) MCG/ACT IN AERS: 2.0000 | INHALATION_SPRAY | Freq: Four times a day (QID) | RESPIRATORY_TRACT | 2 refills | Status: DC | PRN

## 2021-02-17 NOTE — Progress Notes (Signed)
Subjective:   PATIENT ID: Terri Newman GENDER: female DOB: [birth year], MRN: [id number]   HPI  Chief Complaint  Patient presents with   Follow-up    Sarcoid     Reason for Visit: Follow-up sarcoid  Terri Newman is a 61 year old female with sarcoidosis, GERD, hx H. Pylori who presents for chronic cough.  01/09/21 She was previously seen by Dr. Melvyn Novas since 01/2020 for sarcoid management. Was followed by Luan Pulling before this. She was diagnosed with sarcoidosis in 2013 via mediastinoscopy at [facility]. She was treated with prednisone for 8 months with no recurrence off steroids. She had reported unproductive cough on this visit. Prescribed PPI for possible reflux.  01/09/21 Today, she reports has had chronic nonproductive cough for last five months. She will have cough spells that will suddenly start. She occasionally has nasal congestion. She saw GI last month and work-up was negative including neg H.pylori, will follow-up next year.  Recent cardiac work-up also neg. Cough does not interfere with her activities. She reports fatigue but attributes this to sleeping five hours a night.  02/17/21 Since our last visit she has an unproductive cough with occasional morning sputum that is unchanged. Cough was her primary symptoms with sarcoid flare. She reports fatigue but still able to work (lift boxes, walking frequently). She doesn't sleep well. She reports feeling anxious.  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 pylori infection 09/2019   Treated with bismuth subsalicylate, tetracycline, metronidazole, and PPI twice daily x14 days.   Helicobacter pylori gastritis  08/2013   ABO  BID FOR 10 DAYS   Neuromuscular disorder (HCC)    chronic back pain   PONV (postoperative nausea and vomiting) 11/07/2012   Sarcoidosis    Sarcoidosis      Family History  Problem Relation Age of Onset   Heart attack Mother    Alcohol abuse Mother    Heart attack Father    Alcohol abuse Father    Colon cancer Neg Hx      Social History   Occupational History   Occupation: Corporate treasurer    Comment: makes ranch dressing, pasta sauce, salsa, and other food produces   Occupation: Company secretary  Tobacco Use   Smoking status: Never   Smokeless tobacco: Never  Vaping Use   Vaping Use: Never used  Substance and Sexual Activity   Alcohol use: No   Drug use: No   Sexual activity: Not Currently    No Known Allergies   Outpatient Medications Prior to Visit  Medication Sig Dispense Refill   ibuprofen (ADVIL) 200 MG tablet Take 400 mg by mouth every 6 (six) hours as needed.     pantoprazole (PROTONIX) 40 MG tablet Take 30- 60 min before your first and last meals of the day     No facility-administered medications prior to visit.    Review of Systems  Constitutional:  Negative for chills, diaphoresis, fever, malaise/fatigue and weight loss.  HENT:  Negative for congestion.   Respiratory:  Positive for cough. Negative for hemoptysis, sputum production, shortness of breath and wheezing.   Cardiovascular:  Negative for chest pain, palpitations and leg swelling.  Psychiatric/Behavioral:  The patient is nervous/anxious.     Objective:   Vitals:   02/17/21 1202  BP: 98/68  Pulse: 68  Temp: 97.7 F (36.5 C)  TempSrc: Oral  SpO2: 99%  Weight: 126 lb (57.2 kg)  Height: 5' (1.524 m)   SpO2: 99 % O2 Device: None (Room air)  Physical Exam: General: Well-appearing, no acute distress HENT: [place], AT Eyes: EOMI, no scleral icterus Respiratory: Clear to auscultation bilaterally.  No crackles, wheezing or rales Cardiovascular: RRR, -M/R/G, no  JVD Extremities:-Edema,-tenderness Neuro: AAO x4, CNII-XII grossly intact Psych: Normal mood, normal affect  Data Reviewed:  Imaging: CXR 10/15/20 - Normal. No adenopathy. No infiltrate effusion or edema  PFT: 01/21/14 FVC 2.65 (84%) FEV1 2.36 (95%) Ratio 86  TLC 92% DLCO 82% Interpretation: Normal spirometry  02/10/21 FVC 1.72 (60%) FEV1 1.55 (70%) Ratio 88 TLC 76% DLCO 54% Interpretation: Mild restrictive lung defect with moderately reduced diffusing capacity. No significant bronchodilator response. Small air disease present.   Labs: CBC    Component Value Date/Time   WBC 5.2 09/07/2019 0852   RBC 4.34 09/07/2019 0852   HGB 12.6 09/07/2019 0852   HCT 38.5 09/07/2019 0852   HCT 37 08/05/2017 0746   PLT 225 09/07/2019 0852   MCV 88.7 09/07/2019 0852   MCV 88.6 08/05/2017 0746   MCH 29.0 09/07/2019 0852   MCHC 32.7 09/07/2019 0852   RDW 12.9 09/07/2019 0852   RDW 12.6 08/05/2017 0746   LYMPHSABS 2,330 09/07/2019 0852   EOSABS 62 09/07/2019 0852   EOSABS 80 08/05/2017 0746   BASOSABS 21 09/07/2019 0852   CMP Latest Ref Rng & Units 09/07/2019 02/05/2019 08/05/2017  Glucose 65 - 99 mg/dL 97 101(H) -  BUN 7 - 25 mg/dL 14 17 17   Creatinine 0.50 - 1.05 mg/dL 0.80 1.00 0.82  Sodium 135 - 146 mmol/L 140 139 138  Potassium 3.5 - 5.3 mmol/L 4.7 4.2 4.5  Chloride 98 - 110 mmol/L 107 105 104  CO2 20 - 32 mmol/L 30 28 29   Calcium 8.6 - 10.4 mg/dL 9.8 9.4 9.5  Total Protein 6.1 - 8.1 g/dL 7.5 - 7.0  Total Bilirubin 0.2 - 1.2 mg/dL 0.5 - 0.4  Alkaline Phos - - - 75  AST 10 - 35 U/L 15 - 13  ALT 6 - 29 U/L 9 - 12   EKG: 01/02/21 NSR  Assessment & Plan:   Discussion: 61 year old female with sarcoidosis, GERD, hx H. Pylori who presents for chronic cough >6 months. We reviewed PFTs which demonstrate new restrictive defect and worsening gas exchange. We discussed initiation of steroid therapy to treat for sarcoid flare however she is hesitant to restart due to side effects. We will  trial SABA however I do think we need to rule out active pulmonary disease based on the PFT change. Would recommend immunosuppressants if active sarcoid present.   Will initiate treatment on anxiety as this can contribute to her dyspnea as well. Advised to follow-up with PCP for ongoing care for this issue.  Pulmonary sarcoidosis  - Dx in 2013 via mediastinoscopy - S/p steroids 2013. She is hesitant to restart steroids due to side effects (agitation, sleep, weight gain) - Annual PFTs. Last PFTs 02/2021 - Recommend annual ophthalmology exam.  Last visit unknown - EKG  reviewed. No evidence of conduction abnormalities.  - ORDER PET/CT (Sarcoid protocol) at [facility]  Chronic cough - unclear if sarcoid related. Will need to rule out other causes - START flonase 1-2 sprays nightly -  CONTINUE protonix 40 mg daily per GI - RECOMMEND mucinex BID for 1-2 weeks for chest congestion - If not improved on above management and PFTs negative, will refer to ENT  Anxiety --START paxil 10 mg ONCE a day. Please discuss with your primary care doctor for further management  Health Maintenance Immunization History  Administered Date(s) Administered   Influenza-Unspecified 01/16/2014, 12/03/2014, 12/03/2019, 11/22/2020   Pneumococcal Polysaccharide-23 02/27/2013   CT Lung Screen - never smoker  Orders Placed This Encounter  Procedures   NM PET Image Initial (PI) Skull Base To Thigh    Standing Status:   Future    Standing Expiration Date:   02/17/2022    Scheduling Instructions:     [facility] Sarcoid Protocol     Myocardial Viability Study    Order Specific Question:   If indicated for the ordered procedure, I authorize the administration of a radiopharmaceutical per Radiology protocol    Answer:   Yes    Order Specific Question:   Is the patient pregnant?    Answer:   No    Order Specific Question:   Preferred imaging location?    Answer:   External   CT Chest Wo Contrast     Standing Status:   Future    Standing Expiration Date:   02/17/2022    Scheduling Instructions:     [facility] Sarcoid Protocol     Myocardial Viability Study    Order Specific Question:   Is patient pregnant?    Answer:   No    Order Specific Question:   Preferred imaging location?    Answer:   External   Meds ordered this encounter  Medications   PARoxetine (PAXIL) 10 MG tablet    Sig: Take 1 tablet (10 mg total) by mouth daily.    Dispense:  30 tablet    Refill:  2   albuterol (VENTOLIN HFA) 108 (90 Base) MCG/ACT inhaler    Sig: Inhale 2 puffs into the lungs every 6 (six) hours as needed for wheezing or shortness of breath.    Dispense:  8 g    Refill:  2   Return in about 1 month (around 03/20/2021).  I have spent a total time of 36-minutes on the day of the appointment reviewing prior documentation, coordinating care and discussing medical diagnosis and plan with the patient/family. Past medical history, allergies, medications were reviewed. Pertinent imaging, labs and tests included in this note have been reviewed and interpreted independently by me.  [place], MD [place] [facility] 02/17/2021 12:08 PM  Office Number [phone]

## 2021-02-17 NOTE — Patient Instructions (Addendum)
°  Pulmonary sarcoidosis  New restrictive lung defect Chronic cough --ORDER PET/CT (Sarcoid protocol) at [facility] --START Albuterol --Hold on steroids until imaging completed  Anxiety --START paxil 10 mg ONCE a day. Please discuss with your primary care doctor for further management  Follow-up with me in 1 month

## 2021-02-23 ENCOUNTER — Encounter: Payer: Self-pay | Admitting: Pulmonary Disease

## 2021-02-23 DIAGNOSIS — F419 Anxiety disorder, unspecified: Secondary | ICD-10-CM | POA: Insufficient documentation

## 2021-02-24 ENCOUNTER — Ambulatory Visit: Admitting: Nurse Practitioner

## 2021-03-18 ENCOUNTER — Other Ambulatory Visit: Payer: Self-pay

## 2021-03-18 ENCOUNTER — Encounter: Payer: Self-pay | Admitting: Pulmonary Disease

## 2021-03-18 ENCOUNTER — Ambulatory Visit (INDEPENDENT_AMBULATORY_CARE_PROVIDER_SITE_OTHER): Admitting: Pulmonary Disease

## 2021-03-18 VITALS — BP 122/68 | HR 72 | Temp 97.9°F | Ht 60.0 in | Wt 124.8 lb

## 2021-03-18 DIAGNOSIS — R053 Chronic cough: Secondary | ICD-10-CM | POA: Diagnosis not present

## 2021-03-18 DIAGNOSIS — D869 Sarcoidosis, unspecified: Secondary | ICD-10-CM | POA: Diagnosis not present

## 2021-03-18 LAB — CBC WITH DIFFERENTIAL/PLATELET
Basophils Absolute: 0 10*3/uL (ref 0.0–0.1)
Basophils Relative: 0.3 % (ref 0.0–3.0)
Eosinophils Absolute: 0.1 10*3/uL (ref 0.0–0.7)
Eosinophils Relative: 1.4 % (ref 0.0–5.0)
HCT: 34.1 % — ABNORMAL LOW (ref 36.0–46.0)
Hemoglobin: 11.2 g/dL — ABNORMAL LOW (ref 12.0–15.0)
Lymphocytes Relative: 22.4 % (ref 12.0–46.0)
Lymphs Abs: 1.7 10*3/uL (ref 0.7–4.0)
MCHC: 32.9 g/dL (ref 30.0–36.0)
MCV: 89.2 fl (ref 78.0–100.0)
Monocytes Absolute: 0.4 10*3/uL (ref 0.1–1.0)
Monocytes Relative: 5.1 % (ref 3.0–12.0)
Neutro Abs: 5.4 10*3/uL (ref 1.4–7.7)
Neutrophils Relative %: 70.8 % (ref 43.0–77.0)
Platelets: 222 10*3/uL (ref 150.0–400.0)
RBC: 3.82 Mil/uL — ABNORMAL LOW (ref 3.87–5.11)
RDW: 13.4 % (ref 11.5–15.5)
WBC: 7.6 10*3/uL (ref 4.0–10.5)

## 2021-03-18 MED ORDER — PREDNISONE 10 MG PO TABS: 10.0000 mg | ORAL_TABLET | Freq: Every day | ORAL | 0 refills | Status: DC

## 2021-03-18 NOTE — Progress Notes (Signed)
Subjective:   PATIENT ID: Terri Newman GENDER: female DOB: March 24, 1960, MRN: [id number]   HPI  Chief Complaint  Patient presents with   Follow-up    Patient is doing good, no concerns    Reason for Visit: Follow-up sarcoid  [name] is a 61 year old female with sarcoidosis, GERD, hx H. Pylori who presents for follow-up.  01/09/21 She was previously seen by Dr. Melvyn Novas since 01/2020 for sarcoid management. Was followed by Luan Pulling before this. She was diagnosed with sarcoidosis in 2013 via mediastinoscopy at [facility]. She was treated with prednisone for 8 months with no recurrence off steroids. She had reported unproductive cough on this visit. Prescribed PPI for possible reflux.  01/09/21 Today, she reports has had chronic nonproductive cough for last five months. She will have cough spells that will suddenly start. She occasionally has nasal congestion. She saw GI last month and work-up was negative including neg H.pylori, will follow-up next year.  Recent cardiac work-up also neg. Cough does not interfere with her activities. She reports fatigue but attributes this to sleeping five hours a night.  02/17/21 Since our last visit she has an unproductive cough with occasional morning sputum that is unchanged. Cough was her primary symptoms with sarcoid flare. She reports fatigue but still able to work (lift boxes, walking frequently). She doesn't sleep well. She reports feeling anxious.  03/18/21 Since our last visit she started albuterol 2 puffs once a day with improvement cough. She continues to have throat clearing that she attributes to her reflux. She is taking as needed omeprazole. Less anxious now and sleeping better so has not taken her paxil. She is not as fatigued since her cough is better controlled.  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 pylori infection 09/2019   Treated with bismuth subsalicylate, tetracycline, metronidazole, and PPI twice daily x14 days.   Helicobacter pylori gastritis 08/2013   ABO  BID FOR 10 DAYS   Neuromuscular disorder (HCC)    chronic back pain   PONV (postoperative nausea and vomiting) 11/07/2012   Sarcoidosis    Sarcoidosis      Family History  Problem Relation Age of Onset   Heart attack Mother    Alcohol abuse Mother    Heart attack Father    Alcohol abuse Father    Colon cancer Neg Hx      Social History   Occupational History   Occupation: Corporate treasurer    Comment: makes ranch dressing, pasta sauce, salsa, and other food produces   Occupation: Company secretary  Tobacco Use   Smoking status: Never   Smokeless tobacco: Never  Vaping Use   Vaping Use: Never used  Substance and Sexual Activity   Alcohol use: No   Drug use: No   Sexual activity: Not Currently    No Known Allergies   Outpatient Medications Prior to Visit  Medication Sig Dispense Refill   albuterol (VENTOLIN HFA) 108 (90 Base) MCG/ACT inhaler Inhale 2 puffs into the lungs every 6 (six) hours as needed for wheezing or shortness of breath. 8 g 2   ibuprofen (ADVIL) 200 MG tablet Take 400 mg by mouth every 6 (  six) hours as needed.     pantoprazole (PROTONIX) 40 MG tablet Take 30- 60 min before your first and last meals of the day     PARoxetine (PAXIL) 10 MG tablet Take 1 tablet (10 mg total) by mouth daily. (Patient not taking: Reported on 03/18/2021) 30 tablet 2   No facility-administered medications prior to visit.    Review of Systems  Constitutional:  Negative for chills, diaphoresis, fever, malaise/fatigue and weight loss.  HENT:  Negative for congestion.   Respiratory:  Negative for cough,  hemoptysis, sputum production, shortness of breath and wheezing.   Cardiovascular:  Negative for chest pain, palpitations and leg swelling.    Objective:   Vitals:   03/18/21 1421  BP: 122/68  Pulse: 72  Temp: 97.9 F (36.6 C)  TempSrc: Oral  SpO2: 98%  Weight: 124 lb 12.8 oz (56.6 kg)  Height: 5' (1.524 m)    SpO2: 98 % O2 Device: None (Room air)  Physical Exam: General: Well-appearing, no acute distress HENT: [place], AT Eyes: EOMI, no scleral icterus Respiratory: Clear to auscultation bilaterally.  No crackles, wheezing or rales Cardiovascular: RRR, -M/R/G, no JVD Extremities:-Edema,-tenderness Neuro: AAO x4, CNII-XII grossly intact Psych: Normal mood, normal affect  Data Reviewed:  Imaging: CXR 10/15/20 - Normal. No adenopathy. No infiltrate effusion or edema PET/CT 03/10/21 - Normal cardiac perfusion and function. Hypermetabolic activity in mediastinal adenopathy and bilateral lower lobe opacities. No pleural effusions.  PFT: 01/21/14 FVC 2.65 (84%) FEV1 2.36 (95%) Ratio 86  TLC 92% DLCO 82% Interpretation: Normal spirometry  02/10/21 FVC 1.72 (60%) FEV1 1.55 (70%) Ratio 88 TLC 76% DLCO 54% Interpretation: Mild restrictive lung defect with moderately reduced diffusing capacity. No significant bronchodilator response. Small air disease present.   Labs: CBC    Component Value Date/Time   WBC 5.2 09/07/2019 0852   RBC 4.34 09/07/2019 0852   HGB 12.6 09/07/2019 0852   HCT 38.5 09/07/2019 0852   HCT 37 08/05/2017 0746   PLT 225 09/07/2019 0852   MCV 88.7 09/07/2019 0852   MCV 88.6 08/05/2017 0746   MCH 29.0 09/07/2019 0852   MCHC 32.7 09/07/2019 0852   RDW 12.9 09/07/2019 0852   RDW 12.6 08/05/2017 0746   LYMPHSABS 2,330 09/07/2019 0852   EOSABS 62 09/07/2019 0852   EOSABS 80 08/05/2017 0746   BASOSABS 21 09/07/2019 0852   CMP Latest Ref Rng & Units 09/07/2019 02/05/2019 08/05/2017  Glucose 65 - 99 mg/dL 97 101(H) -  BUN 7 - 25 mg/dL 14 17 17   Creatinine 0.50 -  1.05 mg/dL 0.80 1.00 0.82  Sodium 135 - 146 mmol/L 140 139 138  Potassium 3.5 - 5.3 mmol/L 4.7 4.2 4.5  Chloride 98 - 110 mmol/L 107 105 104  CO2 20 - 32 mmol/L 30 28 29   Calcium 8.6 - 10.4 mg/dL 9.8 9.4 9.5  Total Protein 6.1 - 8.1 g/dL 7.5 - 7.0  Total Bilirubin 0.2 - 1.2 mg/dL 0.5 - 0.4  Alkaline Phos - - - 75  AST 10 - 35 U/L 15 - 13  ALT 6 - 29 U/L 9 - 12   EKG: 01/02/21 NSR  Assessment & Plan:   Discussion: 61 year old female with sarcoidosis, GERD, hx H. Pylori who presents for chronic cough >6 months. Her symptoms are improved on PRN albuterol. We reviewed PET/CT which was consistent with active pulmonary sarcoid. Given her mild symptoms will treat with low dose steroids and reassess at next visit.  Adverse effects of steroids discussed  including bruising, weight gain, fluid retention, Cushingoid features, hypertension, cardiac arrhythmias, osteoporosis, gastric ulcers, increased risk of infection, insomnia and hyperglycemia.  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 She is hesitant to restart steroids due to side effects (agitation, sleep, weight gain) - Annual PFTs. Last PFTs 02/2021 - Recommend annual ophthalmology exam.  Last visit unknown - EKG  reviewed. No evidence of conduction abnormalities.  - PET/CT 03/12/21 with active pulmonary sarcoid - START prednisone 10 mg daily x 3 months. If symptoms worsen will increase and taper - RECOMMEND omeprazole (or prescribed reflux medication) 20 mg daily  - ORDER labs: CBC with diff, CMET  Osteoporosis prevention in setting of chronic steroid use If you are taking >2.5 mg of prednisone for >3 month regardless of risk fracture or have moderate-high risk fracture: --Recommend lifestyple modifications (e.g. balanced diet, smoking cessation, moderate alcohol intake and regular weight bearing or resistance exercises) --Recommend calcium 1000 mg and and  vitamin D 400 IU daily   Chronic cough - unclear if sarcoid related. Will need to rule out other causes --CONTINUE Albuterol AS NEEDED  --If not improved on above management and PFTs negative   Health Maintenance Immunization History  Administered Date(s) Administered   Influenza-Unspecified 01/16/2014, 12/03/2014, 12/03/2019, 11/22/2020   Pneumococcal Polysaccharide-23 02/27/2013   CT Lung Screen - never smoker  Orders Placed This Encounter  Procedures   CBC with Differential/Platelet    Standing Status:   Future    Number of Occurrences:   1    Standing Expiration Date:   03/18/2022   Comprehensive metabolic panel    Standing Status:   Future    Number of Occurrences:   1    Standing Expiration Date:   03/18/2022   Meds ordered this encounter  Medications   predniSONE (DELTASONE) 10 MG tablet    Sig: Take 1 tablet (10 mg total) by mouth daily with breakfast.    Dispense:  90 tablet    Refill:  0   Return in about 3 months (around 06/15/2021).  I have spent a total time of 39-minutes on the day of the appointment reviewing prior documentation, coordinating care and discussing medical diagnosis and plan with the patient/family. Past medical history, allergies, medications were reviewed. Pertinent imaging, labs and tests included in this note have been reviewed and interpreted independently by me.  Sully, MD [facility] 03/18/2021 2:27 PM  Office Number [phone]

## 2021-03-18 NOTE — Patient Instructions (Addendum)
°  Pulmonary sarcoidosis  --START prednisone 10 mg daily --RECOMMEND omeprazole (or prescribed reflux medication) 20 mg daily   Osteoporosis prevention in setting of chronic steroid use --Recommend lifestyple modifications (e.g. balanced diet, smoking cessation, moderate alcohol intake and regular weight bearing or resistance exercises) --Recommend calcium 1000 mg and and vitamin D 400 IU daily   Chronic cough - unclear if sarcoid related. Improved on inhaler --CONTINUE Albuterol AS NEEDED   Follow-up with in 3 months

## 2021-03-19 ENCOUNTER — Ambulatory Visit: Admitting: Pulmonary Disease

## 2021-03-19 LAB — COMPREHENSIVE METABOLIC PANEL
ALT: 12 U/L (ref 0–35)
AST: 17 U/L (ref 0–37)
Albumin: 4.1 g/dL (ref 3.5–5.2)
Alkaline Phosphatase: 58 U/L (ref 39–117)
BUN: 16 mg/dL (ref 6–23)
CO2: 31 mEq/L (ref 19–32)
Calcium: 10.1 mg/dL (ref 8.4–10.5)
Chloride: 102 mEq/L (ref 96–112)
Creatinine, Ser: 0.87 mg/dL (ref 0.40–1.20)
GFR: 72.75 mL/min (ref >60.00)
Glucose, Bld: 84 mg/dL (ref 70–99)
Potassium: 4.7 mEq/L (ref 3.5–5.1)
Sodium: 136 mEq/L (ref 135–145)
Total Bilirubin: 0.4 mg/dL (ref 0.2–1.2)
Total Protein: 8.5 g/dL — ABNORMAL HIGH (ref 6.0–8.3)

## 2021-03-20 ENCOUNTER — Encounter: Payer: Self-pay | Admitting: Pulmonary Disease

## 2021-03-20 DIAGNOSIS — R053 Chronic cough: Secondary | ICD-10-CM | POA: Insufficient documentation

## 2021-03-26 ENCOUNTER — Other Ambulatory Visit: Payer: Self-pay | Admitting: Internal Medicine

## 2021-04-02 NOTE — Telephone Encounter (Signed)
Noted  

## 2021-04-13 ENCOUNTER — Encounter: Payer: Self-pay | Admitting: Internal Medicine

## 2021-06-16 ENCOUNTER — Encounter: Payer: Self-pay | Admitting: Pulmonary Disease

## 2021-06-16 ENCOUNTER — Ambulatory Visit (INDEPENDENT_AMBULATORY_CARE_PROVIDER_SITE_OTHER): Admitting: Pulmonary Disease

## 2021-06-16 VITALS — BP 108/66 | HR 73 | Ht 60.0 in | Wt 136.4 lb

## 2021-06-16 DIAGNOSIS — D869 Sarcoidosis, unspecified: Secondary | ICD-10-CM

## 2021-06-16 DIAGNOSIS — R3589 Other polyuria: Secondary | ICD-10-CM | POA: Diagnosis not present

## 2021-06-16 LAB — BASIC METABOLIC PANEL
BUN: 17 mg/dL (ref 6–23)
CO2: 30 mEq/L (ref 19–32)
Calcium: 9.8 mg/dL (ref 8.4–10.5)
Chloride: 100 mEq/L (ref 96–112)
Creatinine, Ser: 0.92 mg/dL (ref 0.40–1.20)
GFR: 67.91 mL/min (ref >60.00)
Glucose, Bld: 100 mg/dL — ABNORMAL HIGH (ref 70–99)
Potassium: 5.1 mEq/L (ref 3.5–5.1)
Sodium: 136 mEq/L (ref 135–145)

## 2021-06-16 MED ORDER — PREDNISONE 10 MG PO TABS
20.0000 mg | ORAL_TABLET | Freq: Every day | ORAL | 0 refills | Status: AC
Start: 2021-06-16 — End: 2021-09-14

## 2021-06-16 NOTE — Patient Instructions (Addendum)
Pulmonary sarcoidosis  ?- Increase prednisone 20 mg daily x 3 months. If symptoms worsen/uncontrolled, recommend methotrexate in the future ?- CONTINUE omeprazole (or prescribed reflux medication) 20 mg daily  ?- ORDER BMET ?- Advised against regular use of ibuprofen or any NSAIDS while taking prednisone ? ?Polyuria ?Prednisone use ?- ORDER BMET ?- ORDER urinary analysis, vitamin D levels ?- Recommend PCP evaluation when able ? ?Osteoporosis prevention in setting of chronic steroid use ?If you are taking >2.5 mg of prednisone for >3 month regardless of risk fracture or have moderate-high risk fracture: ?--Recommend lifestyple modifications (e.g. balanced diet, smoking cessation, moderate alcohol intake and regular weight bearing or resistance exercises) ?--Continue calcium 1000 mg and and vitamin D 400 IU daily  ? ?Follow-up with me in 3 months ?

## 2021-06-16 NOTE — Progress Notes (Signed)
Subjective:   PATIENT ID: Terri Newman GENDER: female DOB: [birth year], MRN: [id number]   HPI  Chief Complaint  Patient presents with   Follow-up    Legs and hands getting cramps and having lower back pain for over a 1wk    Reason for Visit: Follow-up sarcoid  [name] is a 61 year old female with sarcoidosis, GERD, hx H. Pylori who presents for follow-up.  01/09/21 She was previously seen by Dr. Melvyn Novas since 01/2020 for sarcoid management. Was followed by Luan Pulling before this. She was diagnosed with sarcoidosis in 2013 via mediastinoscopy at [facility]. She was treated with prednisone for 8 months with no recurrence off steroids. She had reported unproductive cough on this visit. Prescribed PPI for possible reflux.  01/09/21 Today, she reports has had chronic nonproductive cough for last five months. She will have cough spells that will suddenly start. She occasionally has nasal congestion. She saw GI last month and work-up was negative including neg H.pylori, will follow-up next year.  Recent cardiac work-up also neg. Cough does not interfere with her activities. She reports fatigue but attributes this to sleeping five hours a night.  02/17/21 Since our last visit she has an unproductive cough with occasional morning sputum that is unchanged. Cough was her primary symptoms with sarcoid flare. She reports fatigue but still able to work (lift boxes, walking frequently). She doesn't sleep well. She reports feeling anxious.  03/18/21 Since our last visit she started albuterol 2 puffs once a day with improvement cough. She continues to have throat clearing that she attributes to her reflux. She is taking as needed omeprazole. Less anxious now and sleeping better so has not taken her paxil. She is not as fatigued since her cough is better controlled.  06/16/21 Started on prednisone 10 mg x 3 months. Coughing has improved but not resolved. Continues to have mid chest discomfort. She  does report needing to use the bathroom more often and needing to urinate since starting steroids. Denies dizziness, increased thirst, visual changes, headaches.  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 pylori infection 09/2019   Treated with bismuth subsalicylate, tetracycline, metronidazole, and PPI twice daily x14 days.   Helicobacter pylori gastritis 08/2013   ABO  BID FOR 10 DAYS   Neuromuscular disorder (HCC)    chronic back pain   PONV (postoperative nausea and vomiting) 11/07/2012   Sarcoidosis    Sarcoidosis      Family History  Problem Relation Age of Onset   Heart attack Mother    Alcohol abuse Mother    Heart attack Father    Alcohol abuse Father    Colon cancer Neg Hx      Social History   Occupational History   Occupation: Corporate treasurer    Comment: makes ranch dressing, pasta sauce, salsa, and other food produces   Occupation: Company secretary  Tobacco Use   Smoking status: Never   Smokeless tobacco: Never  Vaping Use   Vaping Use: Never used  Substance and Sexual Activity   Alcohol use: No   Drug use: No   Sexual activity: Not Currently    No Known Allergies   Outpatient Medications Prior to Visit  Medication Sig Dispense Refill   ibuprofen (ADVIL) 200 MG tablet Take 400 mg by mouth every 6 (six) hours as needed.     pantoprazole (PROTONIX) 40 MG tablet Take 1 tablet (40 mg total) by mouth 2 (two) times daily before a meal. TAKE 1 TABLET(40 MG) BY MOUTH TWICE DAILY 60 tablet 11   predniSONE (DELTASONE) 10 MG tablet Take 1 tablet (10 mg total) by mouth daily with breakfast. 90 tablet 0   albuterol (VENTOLIN HFA) 108 (90 Base) MCG/ACT inhaler Inhale 2 puffs into  the lungs every 6 (six) hours as needed for wheezing or shortness of breath. (Patient not taking: Reported on 06/16/2021) 8 g 2   No facility-administered medications prior to visit.    Review of Systems  Constitutional:  Negative for chills, diaphoresis, fever, malaise/fatigue and weight loss.  HENT:  Negative for congestion.   Respiratory:  Positive for cough. Negative for hemoptysis, sputum production, shortness of breath and wheezing.   Cardiovascular:  Positive for chest pain. Negative for palpitations and leg swelling.    Objective:   Vitals:   06/16/21 1541  BP: 108/66  Pulse: 73  SpO2: 97%  Weight: 136 lb 6.4 oz (61.9 kg)  Height: 5' (1.524 m)    SpO2: 97 % O2 Device: None (Room air)  Physical Exam: General: Well-appearing, no acute distress HENT: [place], AT Eyes: EOMI, no scleral icterus Respiratory: Clear to auscultation bilaterally.  No crackles, wheezing or rales Cardiovascular: RRR, -M/R/G, no JVD Extremities:-Edema,-tenderness Neuro: AAO x4, CNII-XII grossly intact Psych: Normal mood, normal affect  Data Reviewed:  Imaging: CXR 10/15/20 - Normal. No adenopathy. No infiltrate effusion or edema PET/CT 03/10/21 - Normal cardiac perfusion and function. Hypermetabolic activity in mediastinal adenopathy and bilateral lower lobe opacities. No pleural effusions.  PFT: 01/21/14 FVC 2.65 (84%) FEV1 2.36 (95%) Ratio 86  TLC 92% DLCO 82% Interpretation: Normal spirometry  02/10/21 FVC 1.72 (60%) FEV1 1.55 (70%) Ratio 88 TLC 76% DLCO 54% Interpretation: Mild restrictive lung defect with moderately reduced diffusing capacity. No significant bronchodilator response. Small air disease present.   Labs: CBC    Component Value Date/Time   WBC 7.6 03/18/2021 1447   RBC 3.82 (L) 03/18/2021 1447   HGB 11.2 (L) 03/18/2021 1447   HCT 34.1 (L) 03/18/2021 1447   HCT 37 08/05/2017 0746   PLT 222.0 03/18/2021 1447   MCV 89.2 03/18/2021 1447   MCV 88.6 08/05/2017 0746   MCH  29.0 09/07/2019 0852   MCHC 32.9 03/18/2021 1447   RDW 13.4 03/18/2021 1447   RDW 12.6 08/05/2017 0746   LYMPHSABS 1.7 03/18/2021 1447   MONOABS 0.4 03/18/2021 1447   EOSABS 0.1 03/18/2021 1447   EOSABS 80 08/05/2017 0746   BASOSABS 0.0 03/18/2021 1447      Latest Ref Rng & Units 03/18/2021    2:47 PM 09/07/2019    8:55 AM 02/05/2019   12:26 AM  CMP  Glucose 70 - 99 mg/dL 84   97   101    BUN 6 - 23 mg/dL '16   14   17    '$ Creatinine 0.40 - 1.20 mg/dL 0.87   0.80   1.00    Sodium 135 - 145 mEq/L 136   140   139    Potassium 3.5 - 5.1 mEq/L 4.7   4.7   4.2    Chloride 96 - 112 mEq/L 102   107   105    CO2 19 - 32 mEq/L 31   30  28    Calcium 8.4 - 10.5 mg/dL 10.1   9.8   9.4    Total Protein 6.0 - 8.3 g/dL 8.5   7.5     Total Bilirubin 0.2 - 1.2 mg/dL 0.4   0.5     Alkaline Phos 39 - 117 U/L 58      AST 0 - 37 U/L 17   15     ALT 0 - 35 U/L 12   9      EKG: 01/02/21 NSR  Assessment & Plan:   Discussion: 61 year old female with sarcoidosis, GERD, hx H.Pyloris who presents for chronic cough >6 months. PET/CT with evidence of active sarcoidosis.  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 She is hesitant to restart steroids due to side effects (agitation, sleep, weight gain) - Annual PFTs. Last PFTs 02/2021 - Recommend annual ophthalmology exam.  Last visit unknown - EKG  reviewed. No evidence of conduction abnormalities.  - PET/CT 03/12/21 with active pulmonary sarcoid  High risk medical management - Increase prednisone 20 mg daily x 3 months. If symptoms worsen/uncontrolled, recommend methotrexate in the future - CONTINUE omeprazole (or prescribed reflux medication) 20 mg daily  - ORDER BMET - Advised against regular use of ibuprofen or any NSAIDS while taking prednisone  Osteoporosis prevention in setting of chronic steroid use If you are taking >2.5 mg of prednisone for >3 month regardless of  risk fracture or have moderate-high risk fracture: --Recommend lifestyple modifications (e.g. balanced diet, smoking cessation, moderate alcohol intake and regular weight bearing or resistance exercises) --Continue calcium 1000 mg and and vitamin D 400 IU daily   Chronic cough 2/2 sarcoid - improved on steroid use --CONTINUE Albuterol AS NEEDED   Polyuria ?Prednisone use - ORDER BMET - ORDER urinary analysis, vitamin D levels - Recommend PCP evaluation when able   Health Maintenance Immunization History  Administered Date(s) Administered   Influenza-Unspecified 01/16/2014, 12/03/2014, 12/03/2019, 11/22/2020   Pneumococcal Polysaccharide-23 02/27/2013   CT Lung Screen - never smoker  Orders Placed This Encounter  Procedures   Vitamin D (25 hydroxy)    Standing Status:   Future    Number of Occurrences:   1    Standing Expiration Date:   06/16/2022   Vitamin D 1,25 dihydroxy    Standing Status:   Future    Number of Occurrences:   1    Standing Expiration Date:   8/52/7782   Basic Metabolic Panel (BMET)    Standing Status:   Future    Number of Occurrences:   1    Standing Expiration Date:   06/16/2022   Urinalysis    Standing Status:   Future    Number of Occurrences:   1    Standing Expiration Date:   06/16/2022   Urinalysis, Routine w reflex microscopic   Meds ordered this encounter  Medications   predniSONE (DELTASONE) 10 MG tablet    Sig: Take 2 tablets (20 mg total) by mouth daily with breakfast.    Dispense:  180 tablet    Refill:  0   Return in about 3 months (around 09/16/2021).  I have spent a total time of 32-minutes on the day of the appointment including chart review, data review, collecting history, coordinating care and discussing medical diagnosis and plan with the patient/family. Past medical history, allergies, medications were reviewed. Pertinent imaging, labs and tests included in this note have been  reviewed and interpreted independently by me.  [place], MD [facility] 06/16/2021 3:50 PM  Office Number [phone]

## 2021-06-17 LAB — URINALYSIS, ROUTINE W REFLEX MICROSCOPIC
Bilirubin Urine: NEGATIVE
Hgb urine dipstick: NEGATIVE
Ketones, ur: NEGATIVE
Nitrite: NEGATIVE
RBC / HPF: NONE SEEN (ref 0–?)
Specific Gravity, Urine: <=1.005 — AB (ref 1.000–1.030)
Total Protein, Urine: NEGATIVE
Urine Glucose: NEGATIVE
Urobilinogen, UA: 0.2 (ref 0.0–1.0)
pH: 7 (ref 5.0–8.0)

## 2021-06-17 LAB — VITAMIN D 25 HYDROXY (VIT D DEFICIENCY, FRACTURES): VITD: 20.47 ng/mL — ABNORMAL LOW (ref 30.00–100.00)

## 2021-06-19 ENCOUNTER — Encounter: Payer: Self-pay | Admitting: Pulmonary Disease

## 2021-06-19 DIAGNOSIS — R3589 Other polyuria: Secondary | ICD-10-CM | POA: Insufficient documentation

## 2021-06-20 LAB — VITAMIN D 1,25 DIHYDROXY
Vitamin D 1, 25 (OH)2 Total: 32 pg/mL (ref 18–72)
Vitamin D2 1, 25 (OH)2: <8 pg/mL
Vitamin D3 1, 25 (OH)2: 32 pg/mL

## 2021-06-24 ENCOUNTER — Encounter: Payer: Self-pay | Admitting: Gastroenterology

## 2021-06-24 NOTE — Progress Notes (Unsigned)
Referring Provider: Noreene Larsson, NP Primary Care Physician:  Noreene Larsson, NP Primary GI Physician: Dr. Abbey Chatters  No chief complaint on file.   HPI:   Terri Newman is a 61 y.o. female with history of GERD, dyspepsia, H. pylori in 2015 treated multiple times and breath test finally negative in November 2022, history of cholecystectomy, and adenomatous colon polyps.  Colonoscopy up-to-date in April 2021 with 2 sessile serrated polyps and external hemorrhoids, due for repeat in 5 years.  She is presenting today for follow-up***.   Last seen in our office 12/03/2020. She completed a course of levofloxacin, amoxicillin, and Protonix.  Had not completed eradication testing for H pylori yet. her symptoms improved mildly at first but now having issues again.  Notes feeling of shortness of breath.  Has been seen by pulmonary.  Also having issues with chest pain.  Did note some epigastric burning.  No dysphagia odynophagia.  Recommended completing H. pylori breath test, after this, restart Protonix, add Carafate 4 times daily.  Stated could consider trial of different medication for functional dyspepsia such as TCA assuming eradication.  Will discuss at follow-up in 2 to 3 months.  Recommended cardiac evaluation given chest pain.  H. pylori breath test was negative.  Patient will cardiology in December 2022.  Felt her chest pain symptoms were noncardiac by description.  She reported 6 months of chronic cough as well as pleuritic chest pain with deep inspiration.  Pain was actually improved with stopping several vitamins.  Suspected this could be acid reflux related.  Stated her bigger issue was chronic cough.  She had a normal EKG in the office.  Recommended reevaluation by pulmonology.  No recommendations for further cardiac testing.   Today:    Past Medical History:  Diagnosis Date   Adenomatous polyp of colon 08/2013   TWO, REDUNDANT LEFT COLON   Constipation by delayed colonic transit  08/23/2013   GERD (gastroesophageal reflux disease)    [employer] 06/06/2020   H. pylori infection 09/2019   Treated with bismuth subsalicylate, tetracycline, metronidazole, and PPI twice daily x14 days.   Helicobacter pylori gastritis 08/2013   ABO  BID FOR 10 DAYS   Neuromuscular disorder ([facility])    chronic back pain   PONV (postoperative nausea and vomiting) 11/07/2012   Sarcoidosis    Sarcoidosis     Past Surgical History:  Procedure Laterality Date   APPENDECTOMY     BIOPSY  10/19/2019   Procedure: BIOPSY;  Surgeon: Eloise Harman, DO;  [facility];  Service: Endoscopy;;   CHOLECYSTECTOMY  Nov 2014   [facility]   COLONOSCOPY N/A 08/29/2013   SLF: Several simple adenomas removed, moderate sized internal hemorrhoids, slightly redundant left colon.   COLONOSCOPY N/A 05/18/2019   Procedure: COLONOSCOPY;  Surgeon: Danie Binder, MD;   Two 5-8 mm polyps in the proximal ascending and distal ascending colon, external hemorrhoids, tortuous left colon.  Pathology with sessile serrated polyps.  Recommended next colonoscopy in 5 years.     ESOPHAGOGASTRODUODENOSCOPY N/A 08/29/2013   SLF: H. pylori gastritis treated with amoxicillin/Biaxin/omeprazole   ESOPHAGOGASTRODUODENOSCOPY (EGD) WITH PROPOFOL N/A 10/19/2019   Procedure: ESOPHAGOGASTRODUODENOSCOPY (EGD) WITH PROPOFOL;  Surgeon: Eloise Harman, DO;  [facility];  Service: Endoscopy;  Laterality: N/A;  1:30pm _patient had positive covid test 09/15/19 in [place], [place], office faxed results-pt to be here at [facility]  05/18/2019   Procedure: POLYPECTOMY;  Surgeon: Danie Binder, MD;  [facility];  Service: Endoscopy;;  ascending x2   TUBAL LIGATION      Current Outpatient Medications  Medication Sig Dispense Refill   albuterol (VENTOLIN HFA) 108 (90 Base) MCG/ACT inhaler Inhale 2 puffs into the lungs every 6 (six) hours as needed for wheezing or shortness of breath. (Patient not  taking: Reported on 06/16/2021) 8 g 2   ibuprofen (ADVIL) 200 MG tablet Take 400 mg by mouth every 6 (six) hours as needed.     pantoprazole (PROTONIX) 40 MG tablet Take 1 tablet (40 mg total) by mouth 2 (two) times daily before a meal. TAKE 1 TABLET(40 MG) BY MOUTH TWICE DAILY 60 tablet 11   predniSONE (DELTASONE) 10 MG tablet Take 2 tablets (20 mg total) by mouth daily with breakfast. 180 tablet 0   No current facility-administered medications for this visit.    Allergies as of 06/25/2021   (No Known Allergies)    Family History  Problem Relation Age of Onset   Heart attack Mother    Alcohol abuse Mother    Heart attack Father    Alcohol abuse Father    Colon cancer Neg Hx     Social History   Socioeconomic History   Marital status: Married    Spouse name: Not on file   Number of children: 3   Years of education: Not on file   Highest education level: Not on file  Occupational History   Occupation: Corporate treasurer    Comment: makes ranch dressing, pasta sauce, salsa, and other food produces   Occupation: Company secretary  Tobacco Use   Smoking status: Never   Smokeless tobacco: Never  Vaping Use   Vaping Use: Never used  Substance and Sexual Activity   Alcohol use: No   Drug use: No   Sexual activity: Not Currently  Other Topics Concern   Not on file  Social History Narrative   Not on file   Social Determinants of Health   Financial Resource Strain: Not on file  Food Insecurity: Not on file  Transportation Needs: Not on file  Physical Activity: Not on file  Stress: Not on file  Social Connections: Not on file    Review of Systems: Gen: Denies fever, chills, cold or flulike symptoms, presyncope, syncope. CV: Denies chest pain, palpitations. Resp: Denies dyspnea , cough.  GI: See HPI Heme: See HPI  Physical Exam: There were no vitals taken for this visit. General:   Alert and oriented. No distress noted. Pleasant and cooperative.  Head:   Normocephalic and atraumatic. Eyes:  Conjuctiva clear without scleral icterus. Heart:  S1, S2 present without murmurs appreciated. Lungs:  Clear to auscultation bilaterally. No wheezes, rales, or rhonchi. No distress.  Abdomen:  +BS, soft, non-tender and non-distended. No rebound or guarding. No HSM or masses noted. Msk:  Symmetrical without gross deformities. Normal posture. Extremities:  Without edema. Neurologic:  Alert and  oriented x4 Psych:  Normal mood and affect.    Assessment:     Plan:  ***   [name], PA-C [facility] 06/25/2021

## 2021-06-25 ENCOUNTER — Encounter: Payer: Self-pay | Admitting: Gastroenterology

## 2021-06-25 ENCOUNTER — Ambulatory Visit (INDEPENDENT_AMBULATORY_CARE_PROVIDER_SITE_OTHER): Admitting: Gastroenterology

## 2021-06-25 VITALS — BP 118/68 | HR 82 | Temp 97.5°F | Ht 60.0 in | Wt 136.0 lb

## 2021-06-25 DIAGNOSIS — K219 Gastro-esophageal reflux disease without esophagitis: Secondary | ICD-10-CM

## 2021-06-25 DIAGNOSIS — R0789 Other chest pain: Secondary | ICD-10-CM | POA: Diagnosis not present

## 2021-06-25 NOTE — Patient Instructions (Signed)
I think your chest pain is coming from something called costochondritis which is inflammation of the cartilage in your chest wall/rib cage.  This is likely secondary to chronic cough related to sarcoidosis.  You can try taking a 7-day course of ibuprofen 400 mg twice a day to see if you have any improvement in your symptoms.  Continue Protonix 40 mg twice daily as this is keeping your acid reflux symptoms well controlled.  We will plan to follow-up with you in 6 months.  Do not hesitate to call if you have any questions or concerns prior to your next visit.  It was good to see you today!  [name], PA-C [facility]

## 2021-07-07 LAB — HM MAMMOGRAPHY

## 2021-09-02 ENCOUNTER — Encounter: Payer: Self-pay | Admitting: Family Medicine

## 2021-09-02 ENCOUNTER — Ambulatory Visit (INDEPENDENT_AMBULATORY_CARE_PROVIDER_SITE_OTHER): Admitting: Family Medicine

## 2021-09-02 VITALS — BP 106/69 | HR 75 | Ht 60.0 in | Wt 136.1 lb

## 2021-09-02 DIAGNOSIS — E559 Vitamin D deficiency, unspecified: Secondary | ICD-10-CM | POA: Diagnosis not present

## 2021-09-02 DIAGNOSIS — R7301 Impaired fasting glucose: Secondary | ICD-10-CM

## 2021-09-02 DIAGNOSIS — Z1159 Encounter for screening for other viral diseases: Secondary | ICD-10-CM

## 2021-09-02 DIAGNOSIS — K219 Gastro-esophageal reflux disease without esophagitis: Secondary | ICD-10-CM | POA: Diagnosis not present

## 2021-09-02 DIAGNOSIS — D869 Sarcoidosis, unspecified: Secondary | ICD-10-CM

## 2021-09-02 DIAGNOSIS — Z114 Encounter for screening for human immunodeficiency virus [HIV]: Secondary | ICD-10-CM

## 2021-09-02 NOTE — Progress Notes (Signed)
documentation

## 2021-09-02 NOTE — Assessment & Plan Note (Signed)
following up with pulmonary and is stable on prednisone 10 mg No complaints or concerns

## 2021-09-02 NOTE — Patient Instructions (Signed)
I appreciate the opportunity to provide care to you today!    Follow up:  4 months CPE  Labs: please stop by the lab  during the week to get your blood drawn (CBC, CMP, TSH, Lipid profile, HgA1c, Vit D)  Screening: HIV and Hep C    Please continue to a heart-healthy diet and increase your physical activities. Try to exercise for 21mns at least three times a week.      It was a pleasure to see you and I look forward to continuing to work together on your health and well-being. Please do not hesitate to call the office if you need care or have questions about your care.   Have a wonderful day and week. With Gratitude, GAlvira MondayMSN, FNP-BC

## 2021-09-02 NOTE — Assessment & Plan Note (Signed)
stable on Protonix No complaints or concerns

## 2021-09-02 NOTE — Progress Notes (Signed)
New Patient Office Visit  Subjective:  Patient ID: Terri Newman, female    DOB: May 15, 1960  Age: 61 y.o. MRN: [id number]  CC:  Chief Complaint  Patient presents with   New Patient (Initial Visit)    Establishing care, no complaints just wanting to establish care.    HPI Terri Newman is a 61 y.o. female with past medical history of sarcoidosis presents for establishing care. No concerns or compliants noted.  Sarcoidosis: following up with pulmonary and is stable on prednisone 10 mg. No complaints or concerns.  GERD: stable on Protonix. No complaints or concerns  Past Medical History:  Diagnosis Date   Adenomatous polyp of colon 08/2013   TWO, REDUNDANT LEFT COLON   Constipation by delayed colonic transit 08/23/2013   GERD (gastroesophageal reflux disease)    [employer] 06/06/2020   H. pylori infection 09/2019   Treated with bismuth subsalicylate, tetracycline, metronidazole, and PPI twice daily x14 days.  Documented eradication November 2022 via breath test.   Helicobacter pylori gastritis 08/2013   ABO  BID FOR 10 DAYS   Neuromuscular disorder ([place])    chronic back pain   PONV (postoperative nausea and vomiting) 11/07/2012   Sarcoidosis    Sarcoidosis     Past Surgical History:  Procedure Laterality Date   APPENDECTOMY     BIOPSY  10/19/2019   Procedure: BIOPSY;  Surgeon: Eloise Harman, DO;  [facility];  Service: Endoscopy;;   CHOLECYSTECTOMY  12/2012   [facility]   COLONOSCOPY N/A 08/29/2013   SLF: Several simple adenomas removed, moderate sized internal hemorrhoids, slightly redundant left colon.   COLONOSCOPY N/A 05/18/2019   Procedure: COLONOSCOPY;  Surgeon: Danie Binder, MD;   Two 5-8 mm polyps in the proximal ascending and distal ascending colon, external hemorrhoids, tortuous left colon.  Pathology with sessile serrated polyps.  Recommended next colonoscopy in 5 years.     ESOPHAGOGASTRODUODENOSCOPY N/A 08/29/2013   SLF: H. pylori  gastritis treated with amoxicillin/Biaxin/omeprazole   ESOPHAGOGASTRODUODENOSCOPY (EGD) WITH PROPOFOL N/A 10/19/2019   Surgeon: Eloise Harman, DO; H. pylori gastritis, benign duodenal biopsies.   NECK SURGERY     POLYPECTOMY  05/18/2019   Procedure: POLYPECTOMY;  Surgeon: Danie Binder, MD;  [facility];  Service: Endoscopy;;  ascending x2   TUBAL LIGATION      Family History  Problem Relation Age of Onset   Heart attack Mother    Alcohol abuse Mother    Heart attack Father    Alcohol abuse Father    Colon cancer Neg Hx     Social History   Socioeconomic History   Marital status: Married    Spouse name: Not on file   Number of children: 3   Years of education: Not on file   Highest education level: Not on file  Occupational History   Occupation: Corporate treasurer    Comment: makes ranch dressing, pasta sauce, salsa, and other food produces   Occupation: Company secretary  Tobacco Use   Smoking status: Never   Smokeless tobacco: Never  Vaping Use   Vaping Use: Never used  Substance and Sexual Activity   Alcohol use: No   Drug use: No   Sexual activity: Not Currently  Other Topics Concern   Not on file  Social History Narrative   Not on file   Social Determinants of Health   Financial Resource Strain: Not on file  Food Insecurity: Not on file  Transportation Needs: Not on file  Physical Activity: Not on file  Stress: Not on file  Social Connections: Not on file  Intimate Partner Violence: Not on file    ROS Review of Systems  Constitutional:  Negative for chills, fatigue and fever.  HENT:  Negative for sinus pressure, sinus pain and sneezing.   Respiratory:  Negative for chest tightness and shortness of breath.   Cardiovascular:  Negative for chest pain, palpitations and leg swelling.  Gastrointestinal:  Negative for diarrhea, nausea and vomiting.  Endocrine: Negative for polydipsia, polyphagia and polyuria.  Genitourinary:  Negative for  frequency and urgency.  Musculoskeletal:  Negative for back pain and neck pain.  Skin:  Negative for rash.  Neurological:  Negative for dizziness, weakness and numbness.  Psychiatric/Behavioral:  Negative for self-injury and suicidal ideas.     Objective:   Today's Vitals: BP 106/69   Pulse 75   Ht 5' (1.524 m)   Wt 136 lb 1.3 oz (61.7 kg)   SpO2 95%   BMI 26.58 kg/m   Physical Exam HENT:     Head: Normocephalic.     Right Ear: External ear normal.     Left Ear: External ear normal.     Nose: No congestion.     Mouth/Throat:     Mouth: Mucous membranes are moist.  Eyes:     Extraocular Movements: Extraocular movements intact.     Pupils: Pupils are equal, round, and reactive to light.  Cardiovascular:     Rate and Rhythm: Normal rate and regular rhythm.     Pulses: Normal pulses.     Heart sounds: Normal heart sounds.  Pulmonary:     Effort: Pulmonary effort is normal.     Breath sounds: Normal breath sounds.  Abdominal:     Palpations: Abdomen is soft.  Musculoskeletal:     Right lower leg: No edema.     Left lower leg: No edema.  Skin:    Findings: No lesion or rash.  Neurological:     Mental Status: She is alert and oriented to person, place, and time.  Psychiatric:     Comments: Normal affect     Assessment & Plan:   Problem List Items Addressed This Visit       Digestive   Gastroesophageal reflux disease    stable on Protonix No complaints or concerns        Other   Sarcoidosis    following up with pulmonary and is stable on prednisone 10 mg No complaints or concerns      Relevant Orders   Lipid Profile   CMP14+EGFR   CBC with Differential/Platelet   TSH + free T4   Other Visit Diagnoses     Encounter for screening for HIV    -  Primary   Relevant Orders   HIV antibody (with reflex)   Need for hepatitis C screening test       Relevant Orders   Hepatitis C Antibody   Vitamin D deficiency       Relevant Orders   Vitamin D (25  hydroxy)   IFG (impaired fasting glucose)       Relevant Orders   Hemoglobin A1C       Outpatient Encounter Medications as of 09/02/2021  Medication Sig   albuterol (VENTOLIN HFA) 108 (90 Base) MCG/ACT inhaler Inhale 2 puffs into the lungs every 6 (six) hours as needed for wheezing or shortness of breath.   ibuprofen (ADVIL) 200 MG tablet Take 400 mg by mouth every  6 (six) hours as needed.   pantoprazole (PROTONIX) 40 MG tablet Take 1 tablet (40 mg total) by mouth 2 (two) times daily before a meal. TAKE 1 TABLET(40 MG) BY MOUTH TWICE DAILY   predniSONE (DELTASONE) 10 MG tablet Take 2 tablets (20 mg total) by mouth daily with breakfast.   No facility-administered encounter medications on file as of 09/02/2021.    Follow-up: Return in about 4 years (around 09/02/2025) for CPE.   Alvira Monday, FNP

## 2021-09-21 ENCOUNTER — Ambulatory Visit (INDEPENDENT_AMBULATORY_CARE_PROVIDER_SITE_OTHER): Admitting: Pulmonary Disease

## 2021-09-21 ENCOUNTER — Encounter: Payer: Self-pay | Admitting: Pulmonary Disease

## 2021-09-21 VITALS — BP 128/70 | HR 71 | Ht 60.0 in | Wt 138.0 lb

## 2021-09-21 DIAGNOSIS — D869 Sarcoidosis, unspecified: Secondary | ICD-10-CM

## 2021-09-21 MED ORDER — PREDNISONE 10 MG PO TABS: 10.0000 mg | ORAL_TABLET | Freq: Every day | ORAL | 0 refills | Status: AC

## 2021-09-21 NOTE — Patient Instructions (Signed)
Sarcoid - DECREASE prednisone 10 mg daily x 2 months. If symptoms worsen/uncontrolled, recommend methotrexate in the future - CONTINUE omeprazole (or prescribed reflux medication) 20 mg daily   Follow-up with me in 3 months

## 2021-09-21 NOTE — Progress Notes (Signed)
Subjective:   PATIENT ID: Terri Newman GENDER: female DOB: 1960-02-08, MRN: [id number]   HPI  Chief Complaint  Patient presents with   Follow-up    No updates    Reason for Visit: Follow-up sarcoid  [name] is a 61 year old female with sarcoidosis, GERD, hx H. Pylori who presents for follow-up.  01/09/21 She was previously seen by Dr. Melvyn Novas since 01/2020 for sarcoid management. Was followed by Luan Pulling before this. She was diagnosed with sarcoidosis in 2013 via mediastinoscopy at [facility]. She was treated with prednisone for 8 months with no recurrence off steroids. She had reported unproductive cough on this visit. Prescribed PPI for possible reflux.  01/09/21 Today, she reports has had chronic nonproductive cough for last five months. She will have cough spells that will suddenly start. She occasionally has nasal congestion. She saw GI last month and work-up was negative including neg H.pylori, will follow-up next year.  Recent cardiac work-up also neg. Cough does not interfere with her activities. She reports fatigue but attributes this to sleeping five hours a night.  02/17/21 Since our last visit she has an unproductive cough with occasional morning sputum that is unchanged. Cough was her primary symptoms with sarcoid flare. She reports fatigue but still able to work (lift boxes, walking frequently). She doesn't sleep well. She reports feeling anxious.  03/18/21 Since our last visit she started albuterol 2 puffs once a day with improvement cough. She continues to have throat clearing that she attributes to her reflux. She is taking as needed omeprazole. Less anxious now and sleeping better so has not taken her paxil. She is not as fatigued since her cough is better controlled.  06/16/21 Started on prednisone 10 mg x 3 months. Coughing has improved but not resolved. Continues to have mid chest discomfort. She does report needing to use the bathroom more often and needing  to urinate since starting steroids. Denies dizziness, increased thirst, visual changes, headaches.  09/21/21 Since last visit, she was increased to prednisone 20 mg. Has tolerated well except for weight gain (12lb). Cough better but can be triggered with food or high intensity activity. Previously had chest pain that has improved. She is currently on Augmentin for an ear infection.  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 pylori infection 09/2019   Treated with bismuth subsalicylate, tetracycline, metronidazole, and PPI twice daily x14 days.  Documented eradication November 2022 via breath test.   Helicobacter pylori gastritis 08/2013   ABO  BID FOR 10 DAYS   Neuromuscular disorder (HCC)    chronic back pain   PONV (postoperative nausea and vomiting) 11/07/2012   Sarcoidosis    Sarcoidosis      Family History  Problem Relation Age of Onset   Heart attack Mother    Alcohol abuse Mother    Heart attack Father    Alcohol abuse Father    Colon cancer Neg Hx      Social History   Occupational History   Occupation: Corporate treasurer    Comment: makes ranch dressing, pasta sauce, salsa, and other food produces   Occupation: Company secretary  Tobacco Use   Smoking status: Never   Smokeless tobacco:  Never  Vaping Use   Vaping Use: Never used  Substance and Sexual Activity   Alcohol use: No   Drug use: No   Sexual activity: Not Currently    No Known Allergies   Outpatient Medications Prior to Visit  Medication Sig Dispense Refill   albuterol (VENTOLIN HFA) 108 (90 Base) MCG/ACT inhaler Inhale 2 puffs into the lungs every 6 (six) hours as needed for wheezing or shortness of breath. 8 g 2    amoxicillin-clavulanate (AUGMENTIN) 875-125 MG tablet SMARTSIG:1 Tablet(s) By Mouth Every 12 Hours     ibuprofen (ADVIL) 200 MG tablet Take 400 mg by mouth every 6 (six) hours as needed.     pantoprazole (PROTONIX) 40 MG tablet Take 1 tablet (40 mg total) by mouth 2 (two) times daily before a meal. TAKE 1 TABLET(40 MG) BY MOUTH TWICE DAILY 60 tablet 11   No facility-administered medications prior to visit.    Review of Systems  Constitutional:  Negative for chills, diaphoresis, fever, malaise/fatigue and weight loss.  HENT:  Negative for congestion.   Respiratory:  Positive for cough. Negative for hemoptysis, sputum production, shortness of breath and wheezing.   Cardiovascular:  Negative for chest pain, palpitations and leg swelling.     Objective:   Vitals:   09/21/21 1507  BP: 128/70  Pulse: 71  SpO2: 97%  Weight: 138 lb (62.6 kg)  Height: 5' (1.524 m)    SpO2: 97 % O2 Device: None (Room air)  Physical Exam: General: Well-appearing, no acute distress HENT: [place], AT Eyes: EOMI, no scleral icterus Respiratory: Clear to auscultation bilaterally.  No crackles, wheezing or rales Cardiovascular: RRR, -M/R/G, no JVD Extremities:-Edema,-tenderness Neuro: AAO x4, CNII-XII grossly intact Psych: Normal mood, normal affect  Data Reviewed:  Imaging: CXR 10/15/20 - Normal. No adenopathy. No infiltrate effusion or edema PET/CT 03/10/21 - Normal cardiac perfusion and function. Hypermetabolic activity in mediastinal adenopathy and bilateral lower lobe opacities. No pleural effusions.  PFT: 01/21/14 FVC 2.65 (84%) FEV1 2.36 (95%) Ratio 86  TLC 92% DLCO 82% Interpretation: Normal spirometry  02/10/21 FVC 1.72 (60%) FEV1 1.55 (70%) Ratio 88 TLC 76% DLCO 54% Interpretation: Mild restrictive lung defect with moderately reduced diffusing capacity. No significant bronchodilator response. Small air disease present.   Labs: CBC    Component Value Date/Time   WBC 7.6 03/18/2021 1447    RBC 3.82 (L) 03/18/2021 1447   HGB 11.2 (L) 03/18/2021 1447   HCT 34.1 (L) 03/18/2021 1447   HCT 37 08/05/2017 0746   PLT 222.0 03/18/2021 1447   MCV 89.2 03/18/2021 1447   MCV 88.6 08/05/2017 0746   MCH 29.0 09/07/2019 0852   MCHC 32.9 03/18/2021 1447   RDW 13.4 03/18/2021 1447   RDW 12.6 08/05/2017 0746   LYMPHSABS 1.7 03/18/2021 1447   MONOABS 0.4 03/18/2021 1447   EOSABS 0.1 03/18/2021 1447   EOSABS 80 08/05/2017 0746   BASOSABS 0.0 03/18/2021 1447      Latest Ref Rng & Units 06/16/2021    4:14 PM 03/18/2021    2:47 PM 09/07/2019    8:55 AM  CMP  Glucose 70 - 99 mg/dL 100  84  97   BUN 6 - 23 mg/dL '17  16  14   '$ Creatinine 0.40 - 1.20 mg/dL 0.92  0.87  0.80   Sodium 135 - 145 mEq/L 136  136  140   Potassium 3.5 - 5.1 mEq/L 5.1  4.7  4.7   Chloride 96 - 112 mEq/L 100  102  107   CO2 19 - 32 mEq/L '30  31  30   '$ Calcium 8.4 - 10.5 mg/dL 9.8  10.1  9.8   Total Protein 6.0 - 8.3 g/dL  8.5  7.5   Total Bilirubin 0.2 - 1.2 mg/dL  0.4  0.5   Alkaline Phos 39 - 117 U/L  58    AST 0 - 37 U/L  17  15   ALT 0 - 35 U/L  12  9    EKG: 01/02/21 NSR  Assessment & Plan:   Discussion: 61 year old female with sarcoidosis, GERD, hx H.Pyloris who presents for chronic cough >6 months. PET/CT with evidence of active sarcoidosis.  61 year old female with sarcoidosis, GERD, hx H.Pylori who presents for chronic cough >6 months in active sarcoid flare confirmed on PET. On prednisone taper with improved symtpoms.  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 Hx of side effects (agitation, sleep, weight gain) - Annual PFTs. Last PFTs 02/2021 - Recommend annual ophthalmology exam.  Last visit unknown - EKG  reviewed. No evidence of conduction abnormalities.  - PET/CT 03/12/21 with active pulmonary sarcoid  High risk medical management - DECREASE prednisone 10 mg daily x 2 months. If symptoms worsen/uncontrolled,  recommend methotrexate in the future - CONTINUE omeprazole (or prescribed reflux medication) 20 mg daily  - Advised against regular use of ibuprofen or any NSAIDS while taking prednisone  Osteoporosis prevention in setting of chronic steroid use If you are taking >2.5 mg of prednisone for >3 month regardless of risk fracture or have moderate-high risk fracture: --Recommend lifestyple modifications (e.g. 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  Meds ordered this encounter  Medications   predniSONE (DELTASONE) 10 MG tablet    Sig: Take 1 tablet (10 mg total) by mouth daily with breakfast.    Dispense:  60 tablet    Refill:  0   Return in about 3 months (around 12/22/2021).  I have spent a total time of 31-minutes on the day of the appointment including chart review, data review, collecting history, coordinating care and discussing medical diagnosis and plan with the patient/family. Past medical history, allergies, medications were reviewed. Pertinent imaging, labs and tests included in this note have been reviewed and interpreted independently by me.  [place], MD [facility] 09/21/2021 3:25 PM  Office Number [phone]

## 2021-09-26 LAB — CBC WITH DIFFERENTIAL/PLATELET
Basophils Absolute: 0 10*3/uL (ref 0.0–0.2)
Basos: 0 %
EOS (ABSOLUTE): 0.1 10*3/uL (ref 0.0–0.4)
Eos: 1 %
Hematocrit: 40.2 % (ref 34.0–46.6)
Hemoglobin: 13.3 g/dL (ref 11.1–15.9)
Immature Grans (Abs): 0 10*3/uL (ref 0.0–0.1)
Immature Granulocytes: 0 %
Lymphocytes Absolute: 3.3 10*3/uL — ABNORMAL HIGH (ref 0.7–3.1)
Lymphs: 36 %
MCH: 30.2 pg (ref 26.6–33.0)
MCHC: 33.1 g/dL (ref 31.5–35.7)
MCV: 91 fL (ref 79–97)
Monocytes Absolute: 0.5 10*3/uL (ref 0.1–0.9)
Monocytes: 6 %
Neutrophils Absolute: 5.2 10*3/uL (ref 1.4–7.0)
Neutrophils: 57 %
Platelets: 262 10*3/uL (ref 150–450)
RBC: 4.4 x10E6/uL (ref 3.77–5.28)
RDW: 11.8 % (ref 11.7–15.4)
WBC: 9.2 10*3/uL (ref 3.4–10.8)

## 2021-09-26 LAB — CMP14+EGFR
ALT: 13 IU/L (ref 0–32)
AST: 15 IU/L (ref 0–40)
Albumin/Globulin Ratio: 1.5 (ref 1.2–2.2)
Albumin: 4.1 g/dL (ref 3.8–4.9)
Alkaline Phosphatase: 72 IU/L (ref 44–121)
BUN/Creatinine Ratio: 18 (ref 12–28)
BUN: 15 mg/dL (ref 8–27)
Bilirubin Total: 0.3 mg/dL (ref 0.0–1.2)
CO2: 26 mmol/L (ref 20–29)
Calcium: 9.7 mg/dL (ref 8.7–10.3)
Chloride: 101 mmol/L (ref 96–106)
Creatinine, Ser: 0.83 mg/dL (ref 0.57–1.00)
Globulin, Total: 2.8 g/dL (ref 1.5–4.5)
Glucose: 84 mg/dL (ref 70–99)
Potassium: 4.5 mmol/L (ref 3.5–5.2)
Sodium: 140 mmol/L (ref 134–144)
Total Protein: 6.9 g/dL (ref 6.0–8.5)
eGFR: 81 mL/min/{1.73_m2} (ref >59)

## 2021-09-26 LAB — LIPID PANEL
Chol/HDL Ratio: 4.3 ratio (ref 0.0–4.4)
Cholesterol, Total: 235 mg/dL — ABNORMAL HIGH (ref 100–199)
HDL: 55 mg/dL (ref >39)
LDL Chol Calc (NIH): 160 mg/dL — ABNORMAL HIGH (ref 0–99)
Triglycerides: 114 mg/dL (ref 0–149)
VLDL Cholesterol Cal: 20 mg/dL (ref 5–40)

## 2021-09-26 LAB — VITAMIN D 25 HYDROXY (VIT D DEFICIENCY, FRACTURES): Vit D, 25-Hydroxy: 24.9 ng/mL — ABNORMAL LOW (ref 30.0–100.0)

## 2021-09-26 LAB — HEMOGLOBIN A1C
Est. average glucose Bld gHb Est-mCnc: 123 mg/dL
Hgb A1c MFr Bld: 5.9 % — ABNORMAL HIGH (ref 4.8–5.6)

## 2021-09-26 LAB — HIV ANTIBODY (ROUTINE TESTING W REFLEX): HIV Screen 4th Generation wRfx: NONREACTIVE

## 2021-09-26 LAB — HEPATITIS C ANTIBODY: Hep C Virus Ab: NONREACTIVE

## 2021-09-26 LAB — TSH+FREE T4
Free T4: 1.26 ng/dL (ref 0.82–1.77)
TSH: 2.32 u[IU]/mL (ref 0.450–4.500)

## 2021-09-30 ENCOUNTER — Other Ambulatory Visit: Payer: Self-pay | Admitting: Family Medicine

## 2021-09-30 DIAGNOSIS — E559 Vitamin D deficiency, unspecified: Secondary | ICD-10-CM

## 2021-09-30 MED ORDER — VITAMIN D (ERGOCALCIFEROL) 1.25 MG (50000 UNIT) PO CAPS: 50000.0000 [IU] | ORAL_CAPSULE | ORAL | 1 refills | Status: DC

## 2021-09-30 NOTE — Progress Notes (Signed)
  Please inform the patient that he has prediabetes. I recommend walking at least 3 times daily for 30 minutes and decreasing her intake of foods high in sugar.   Informed him to decrease his intake of foods high in saturated fats and cholesterol, like steak, beef roast, ribs, pork chop, ground beef, butter, cheese, and ice cream.  He cholesterol is elevated.    I've sent a prescription for Vit D to her pharmacy to start taking.

## 2021-09-30 NOTE — Progress Notes (Signed)
The 10-year ASCVD risk score (Arnett DK, et al., 2019) is: 3.7%   Values used to calculate the score:     Age: 61 years     Sex: Female     Is Non-Hispanic African American: No     Diabetic: No     Tobacco smoker: No     Systolic Blood Pressure: 017 mmHg     Is BP treated: No     HDL Cholesterol: 55 mg/dL     Total Cholesterol: 235 mg/dL

## 2021-11-20 ENCOUNTER — Telehealth: Payer: Self-pay | Admitting: Pulmonary Disease

## 2021-11-20 NOTE — Telephone Encounter (Signed)
Called and left voicemail regarding her questions about follow up and repeat scan

## 2021-11-23 ENCOUNTER — Telehealth: Payer: Self-pay | Admitting: Pulmonary Disease

## 2021-12-02 NOTE — Telephone Encounter (Signed)
Will close encounter

## 2021-12-03 ENCOUNTER — Encounter: Payer: Self-pay | Admitting: *Deleted

## 2021-12-03 ENCOUNTER — Ambulatory Visit (HOSPITAL_COMMUNITY)
Admission: RE | Admit: 2021-12-03 | Discharge: 2021-12-03 | Disposition: A | Discharge status: Home / Self Care | Source: Ambulatory Visit | Attending: Pulmonary Disease | Admitting: Pulmonary Disease

## 2021-12-03 DIAGNOSIS — D869 Sarcoidosis, unspecified: Secondary | ICD-10-CM | POA: Diagnosis present

## 2021-12-03 LAB — GLUCOSE, CAPILLARY: Glucose-Capillary: 99 mg/dL (ref 70–99)

## 2021-12-03 MED ORDER — FLUDEOXYGLUCOSE F - 18 (FDG) INJECTION
7.5000 | Freq: Once | INTRAVENOUS | Status: AC
  Administered 2021-12-03: 6.89 via INTRAVENOUS

## 2021-12-11 ENCOUNTER — Ambulatory Visit: Admitting: Nurse Practitioner

## 2021-12-17 ENCOUNTER — Ambulatory Visit (INDEPENDENT_AMBULATORY_CARE_PROVIDER_SITE_OTHER): Admitting: Pulmonary Disease

## 2021-12-17 ENCOUNTER — Encounter (HOSPITAL_BASED_OUTPATIENT_CLINIC_OR_DEPARTMENT_OTHER): Payer: Self-pay | Admitting: Pulmonary Disease

## 2021-12-17 VITALS — BP 124/70 | HR 85 | Ht 60.0 in | Wt 134.2 lb

## 2021-12-17 DIAGNOSIS — D869 Sarcoidosis, unspecified: Secondary | ICD-10-CM | POA: Diagnosis not present

## 2021-12-17 MED ORDER — PREDNISONE 5 MG PO TABS: 5.0000 mg | ORAL_TABLET | Freq: Every day | ORAL | 0 refills | Status: DC

## 2021-12-17 MED ORDER — PAROXETINE HCL 10 MG PO TABS: 10.0000 mg | ORAL_TABLET | Freq: Every day | ORAL | 2 refills | Status: DC

## 2021-12-17 NOTE — Progress Notes (Signed)
Subjective:   PATIENT ID: Terri Newman GENDER: female DOB: Dec 21, 1960, MRN: [id number]   HPI  Chief Complaint  Patient presents with   Follow-up    PET and CT results    Reason for Visit: Follow-up sarcoid  Terri Newman is a 61 year old female with sarcoidosis, GERD, hx H. Pylori who presents for follow-up.  01/09/21 She was previously seen by Dr. Melvyn Novas since 01/2020 for sarcoid management. Was followed by Luan Pulling before this. She was diagnosed with sarcoidosis in 2013 via mediastinoscopy at [facility]. She was treated with prednisone for 8 months with no recurrence off steroids. She had reported unproductive cough on this visit. Prescribed PPI for possible reflux.  01/09/21 Today, she reports has had chronic nonproductive cough for last five months. She will have cough spells that will suddenly start. She occasionally has nasal congestion. She saw GI last month and work-up was negative including neg H.pylori, will follow-up next year.  Recent cardiac work-up also neg. Cough does not interfere with her activities. She reports fatigue but attributes this to sleeping five hours a night.  02/17/21 Since our last visit she has an unproductive cough with occasional morning sputum that is unchanged. Cough was her primary symptoms with sarcoid flare. She reports fatigue but still able to work (lift boxes, walking frequently). She doesn't sleep well. She reports feeling anxious.  03/18/21 Since our last visit she started albuterol 2 puffs once a day with improvement cough. She continues to have throat clearing that she attributes to her reflux. She is taking as needed omeprazole. Less anxious now and sleeping better so has not taken her paxil. She is not as fatigued since her cough is better controlled.  06/16/21 Started on prednisone 10 mg x 3 months. Coughing has improved but not resolved. Continues to have mid chest discomfort. She does report needing to use the bathroom more often and  needing to urinate since starting steroids. Denies dizziness, increased thirst, visual changes, headaches.  09/21/21 Since last visit, she was increased to prednisone 20 mg. Has tolerated well except for weight gain (12lb). Cough better but can be triggered with food or high intensity activity. Previously had chest pain that has improved. She is currently on Augmentin for an ear infection.  12/17/21 Currently on prednisone 10 mg daily. Tolerating well. Has some shortness of breath that requires albuterol once a month. Denies wheezing. Occasional cough. Has been feeling anxious lately and requesting anti-anxiety med but not wanting anything sedation.   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 pylori infection 09/2019   Treated with bismuth subsalicylate, tetracycline, metronidazole, and PPI twice daily x14 days.  Documented eradication November 2022 via breath test.   Helicobacter pylori gastritis 08/2013   ABO  BID FOR 10 DAYS   Neuromuscular disorder (HCC)    chronic back pain   PONV (postoperative nausea and vomiting) 11/07/2012   Sarcoidosis    Sarcoidosis      Family History  Problem Relation Age of Onset   Heart attack Mother    Alcohol abuse Mother    Heart attack Father    Alcohol abuse Father    Colon cancer Neg Hx  Social History   Occupational History   Occupation: Corporate treasurer    Comment: makes ranch dressing, pasta sauce, [place], and other food produces   Occupation: Company secretary  Tobacco Use   Smoking status: Never   Smokeless tobacco: Never  Vaping Use   Vaping Use: Never used  Substance and Sexual Activity   Alcohol use: No   Drug use: No   Sexual activity:  Not Currently    No Known Allergies   Outpatient Medications Prior to Visit  Medication Sig Dispense Refill   albuterol (VENTOLIN HFA) 108 (90 Base) MCG/ACT inhaler Inhale 2 puffs into the lungs every 6 (six) hours as needed for wheezing or shortness of breath. 8 g 2   pantoprazole (PROTONIX) 40 MG tablet Take 1 tablet (40 mg total) by mouth 2 (two) times daily before a meal. TAKE 1 TABLET(40 MG) BY MOUTH TWICE DAILY 60 tablet 11   Vitamin D, Ergocalciferol, (DRISDOL) 1.25 MG (50000 UNIT) CAPS capsule Take 1 capsule (50,000 Units total) by mouth every 7 (seven) days. 5 capsule 1   ibuprofen (ADVIL) 200 MG tablet Take 400 mg by mouth every 6 (six) hours as needed.     amoxicillin-clavulanate (AUGMENTIN) 875-125 MG tablet SMARTSIG:1 Tablet(s) By Mouth Every 12 Hours     No facility-administered medications prior to visit.    Review of Systems  Constitutional:  Negative for chills, diaphoresis, fever, malaise/fatigue and weight loss.  HENT:  Negative for congestion.   Respiratory:  Positive for cough and shortness of breath. Negative for hemoptysis, sputum production and wheezing.   Cardiovascular:  Negative for chest pain, palpitations and leg swelling.     Objective:   Vitals:   12/17/21 1306  BP: 124/70  Pulse: 85  SpO2: 99%  Weight: 134 lb 2.4 oz (60.9 kg)  Height: 5' (1.524 m)    SpO2: 99 % O2 Device: None (Room air)  Physical Exam: General: Well-appearing, no acute distress HENT: [place], AT Eyes: EOMI, no scleral icterus Respiratory: Clear to auscultation bilaterally.  No crackles, wheezing or rales Cardiovascular: RRR, -M/R/G, no JVD Extremities:-Edema,-tenderness Neuro: AAO x4, CNII-XII grossly intact Psych: Normal mood, normal affect  Data Reviewed:  Imaging: CXR 10/15/20 - Normal. No adenopathy. No infiltrate effusion or edema PET/CT 03/10/21 - Normal cardiac perfusion and function. Hypermetabolic activity in mediastinal adenopathy and bilateral lower lobe  opacities. No pleural effusions. PET/CT 12/03/21 - Low-level avidity, nonspecific. No hypermetabolic activity in adenopathy. Interval visualization of mild pulmonary fibrosis with apical to basal gradient with associated ground glass and some traction bronchiectasis. No honeycombing.  PFT: 01/21/14 FVC 2.65 (84%) FEV1 2.36 (95%) Ratio 86  TLC 92% DLCO 82% Interpretation: Normal spirometry  02/10/21 FVC 1.72 (60%) FEV1 1.55 (70%) Ratio 88 TLC 76% DLCO 54% Interpretation: Mild restrictive lung defect with moderately reduced diffusing capacity. No significant bronchodilator response. Small air disease present.   Labs: CBC    Component Value Date/Time   WBC 9.2 09/25/2021 0832   WBC 7.6 03/18/2021 1447   RBC 4.40 09/25/2021 0832   RBC 3.82 (L) 03/18/2021 1447   HGB 13.3 09/25/2021 0832   HCT 40.2 09/25/2021 0832   HCT 37 08/05/2017 0746   PLT 262 09/25/2021 0832   MCV 91 09/25/2021 0832   MCV 88.6 08/05/2017 0746   MCH 30.2 09/25/2021 0832   MCH 29.0 09/07/2019 0852   MCHC 33.1 09/25/2021 0832   MCHC 32.9 03/18/2021 1447   RDW 11.8 09/25/2021 0832   RDW 12.6 08/05/2017 0746  LYMPHSABS 3.3 (H) 09/25/2021 0832   MONOABS 0.4 03/18/2021 1447   EOSABS 0.1 09/25/2021 0832   EOSABS 80 08/05/2017 0746   BASOSABS 0.0 09/25/2021 0832      Latest Ref Rng & Units 09/25/2021    8:32 AM 06/16/2021    4:14 PM 03/18/2021    2:47 PM  CMP  Glucose 70 - 99 mg/dL 84  100  84   BUN 8 - 27 mg/dL '15  17  16   '$ Creatinine 0.57 - 1.00 mg/dL 0.83  0.92  0.87   Sodium 134 - 144 mmol/L 140  136  136   Potassium 3.5 - 5.2 mmol/L 4.5  5.1  4.7   Chloride 96 - 106 mmol/L 101  100  102   CO2 20 - 29 mmol/L '26  30  31   '$ Calcium 8.7 - 10.3 mg/dL 9.7  9.8  10.1   Total Protein 6.0 - 8.5 g/dL 6.9   8.5   Total Bilirubin 0.0 - 1.2 mg/dL 0.3   0.4   Alkaline Phos 44 - 121 IU/L 72   58   AST 0 - 40 IU/L 15   17   ALT 0 - 32 IU/L 13   12    EKG: 01/02/21 NSR  Assessment & Plan:   Discussion: 61 year  old female with sarcoidosis, GERD, hx H pylori who presents for follow-up for sarcoid flare. Cough is under control. PET/CT demonstrates improved activity. Plan to taper steroids off. CT imaging also demonstrates mild lung fibrosis compared to 2021. Unclear when this developed but patient did have restrictive defect in 02/2021 PFTs so may have been present before this time. Currently has no O2 needs. Etiology of fibrosis could be from post-viral/inflammatory process, prior sarcoid flare. Doubt this represents IPF based on distribution. Cough could be related to fibrosis. 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 Hx of side effects (agitation, sleep, weight gain) - Annual PFTs. Last PFTs 02/2021 - Recommend annual ophthalmology exam.  Last visit unknown - EKG  reviewed. No evidence of conduction abnormalities.  - PET/CT 03/12/21 with active pulmonary sarcoid - PET/CT 12/03/21 - low-level avidity. Improved flare. Interval mild fibrosis seen L>R --ORDER CT Chest without contrast in 6 months --ORDER pulmonary function test in 6 months  High risk medical management Prednisone 10 mg 03/18/21-06/16/21 Prednisone 20 mg 06/16/21- 09/21/21 Prednisone 10 mg 09/21/21- current --DECREASE prednisone to 5 mg daily x 2 weeks, followed by --DECREASE prednisone to 5 mg every other day x 2 weeks, then STOP --Advised against regular use of ibuprofen or any NSAIDS or sudafed while taking prednisone  Chronic cough 2/2 sarcoid +/- fibrosis - improved on steroid use --CONTINUE Albuterol AS NEEDED   Anxiety --Restarted paxil 10 mg daily --Advised to discuss with PCP to continue for future refills/med management  Health Maintenance Immunization History  Administered Date(s) Administered   Influenza-Unspecified 01/16/2014,  12/03/2014, 12/03/2019, 11/22/2020   Pneumococcal Polysaccharide-23 02/27/2013   Tdap 06/01/2021   CT Lung Screen - never smoker  Orders Placed This Encounter  Procedures   CT Chest Wo Contrast    Standing Status:   Future    Standing Expiration Date:   12/18/2022    Scheduling Instructions:     Completed in 46mobefore ov    Order Specific Question:   Is  patient pregnant?    Answer:   No    Order Specific Question:   Preferred imaging location?    Answer:   [facility]   Pulmonary function test    Standing Status:   Future    Standing Expiration Date:   12/18/2022    Scheduling Instructions:     To be completed in 12mo   Order Specific Question:   Where should this test be performed?    Answer:   [facility]   Meds ordered this encounter  Medications   PARoxetine (PAXIL) 10 MG tablet    Sig: Take 1 tablet (10 mg total) by mouth daily.    Dispense:  30 tablet    Refill:  2   predniSONE (DELTASONE) 5 MG tablet    Sig: Take 1 tablet (5 mg total) by mouth daily with breakfast.    Dispense:  21 tablet    Refill:  0   Return in about 6 months (around 06/17/2022).  I have spent a total time of 35-minutes on the day of the appointment including chart review, data review, collecting history, coordinating care and discussing medical diagnosis and plan with the patient/family. Past medical history, allergies, medications were reviewed. Pertinent imaging, labs and tests included in this note have been reviewed and interpreted independently by me.  CDarwin MD [facility] 12/17/2021 2:10 PM  Office Number [phone]

## 2021-12-17 NOTE — Patient Instructions (Addendum)
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

## 2021-12-29 ENCOUNTER — Telehealth: Payer: Self-pay

## 2021-12-29 NOTE — Telephone Encounter (Signed)
Pt is called for a follow up visit. She received a letter in the mail from [employer]

## 2021-12-29 NOTE — Telephone Encounter (Signed)
noted 

## 2021-12-30 ENCOUNTER — Telehealth: Payer: Self-pay

## 2021-12-30 NOTE — Telephone Encounter (Signed)
Spoke with the pt and she had appt 06/25/2021 with Cyril Mourning. She tested pos for H Pylori but never came back for eradication testing. Pt states she feels like she has the same thing again and wants to know can we send in labs for her to have the testing done to check for H Pylori or will she need to come first to her upcoming ov first on 02/10/2022 with you. Please advise

## 2021-12-31 ENCOUNTER — Encounter: Admitting: Family Medicine

## 2021-12-31 ENCOUNTER — Telehealth: Payer: Self-pay | Admitting: Internal Medicine

## 2021-12-31 ENCOUNTER — Other Ambulatory Visit: Payer: Self-pay | Admitting: Internal Medicine

## 2021-12-31 DIAGNOSIS — B9681 Helicobacter pylori [H. pylori] as the cause of diseases classified elsewhere: Secondary | ICD-10-CM

## 2021-12-31 NOTE — Telephone Encounter (Signed)
Phoned and advised the pt of the change to the breath test and reminded her of the 10 day hold of PPI medications. Pt expressed expressed understanding

## 2021-12-31 NOTE — Telephone Encounter (Signed)
Does not matter to me.  I will change to the breath test.  Thank you

## 2021-12-31 NOTE — Telephone Encounter (Signed)
I will order H. pylori stool antigen testing at [place].  She will need to drop off a sample.  She will need to hold her pantoprazole for at least 10 days prior to obtaining sample.  She should not take any bismuth containing products either.  During that time she can take over-the-counter famotidine for reflux.  After she collect sample, she can immediately resume her pantoprazole.

## 2021-12-31 NOTE — Telephone Encounter (Signed)
Phoned the pt and advised of the lab work and instructions to the medications. Pt asks was there a specific reason why you ordered the stool test? Pt states she prefers to do the breath test but if you had a specific reason to do the stool test she will do it. Please advise

## 2022-01-01 ENCOUNTER — Encounter: Admitting: Family Medicine

## 2022-01-19 ENCOUNTER — Encounter: Payer: Self-pay | Admitting: Family Medicine

## 2022-01-19 ENCOUNTER — Ambulatory Visit (INDEPENDENT_AMBULATORY_CARE_PROVIDER_SITE_OTHER): Admitting: Family Medicine

## 2022-01-19 VITALS — BP 108/71 | HR 76 | Ht 60.0 in | Wt 137.0 lb

## 2022-01-19 DIAGNOSIS — E559 Vitamin D deficiency, unspecified: Secondary | ICD-10-CM

## 2022-01-19 DIAGNOSIS — Z0001 Encounter for general adult medical examination with abnormal findings: Secondary | ICD-10-CM | POA: Diagnosis not present

## 2022-01-19 DIAGNOSIS — R7303 Prediabetes: Secondary | ICD-10-CM | POA: Diagnosis not present

## 2022-01-19 DIAGNOSIS — R0602 Shortness of breath: Secondary | ICD-10-CM

## 2022-01-19 DIAGNOSIS — R7301 Impaired fasting glucose: Secondary | ICD-10-CM

## 2022-01-19 DIAGNOSIS — E7800 Pure hypercholesterolemia, unspecified: Secondary | ICD-10-CM

## 2022-01-19 DIAGNOSIS — E038 Other specified hypothyroidism: Secondary | ICD-10-CM

## 2022-01-19 DIAGNOSIS — M5412 Radiculopathy, cervical region: Secondary | ICD-10-CM

## 2022-01-19 DIAGNOSIS — K219 Gastro-esophageal reflux disease without esophagitis: Secondary | ICD-10-CM

## 2022-01-19 LAB — H. PYLORI BREATH TEST: H pylori Breath Test: POSITIVE — AB

## 2022-01-19 MED ORDER — ALBUTEROL SULFATE HFA 108 (90 BASE) MCG/ACT IN AERS: 2.0000 | INHALATION_SPRAY | Freq: Four times a day (QID) | RESPIRATORY_TRACT | 2 refills | Status: DC | PRN

## 2022-01-19 MED ORDER — VITAMIN D (ERGOCALCIFEROL) 1.25 MG (50000 UNIT) PO CAPS: 50000.0000 [IU] | ORAL_CAPSULE | ORAL | 2 refills | Status: DC

## 2022-01-19 MED ORDER — CYCLOBENZAPRINE HCL 5 MG PO TABS: 5.0000 mg | ORAL_TABLET | Freq: Three times a day (TID) | ORAL | 1 refills | Status: DC | PRN

## 2022-01-19 MED ORDER — PANTOPRAZOLE SODIUM 40 MG PO TBEC: 40.0000 mg | DELAYED_RELEASE_TABLET | ORAL | 2 refills | Status: DC | PRN

## 2022-01-19 NOTE — Progress Notes (Signed)
Complete physical exam  Patient: Terri Newman   DOB: 03-11-1960   61 y.o. Female  MRN: [id number]  Subjective:    Chief Complaint  Patient presents with   Annual Exam    Cpe today.    Terri Newman is a 61 y.o. female who presents today for a complete physical exam. She reports consuming a general diet. The patient does not participate in regular exercise at present. She generally feels well. She reports sleeping poorly. She does not have additional problems to discuss today.    Most recent fall risk assessment:    01/19/2022    3:31 PM  [place] in the past year? 0  Number falls in past yr: 0  Injury with Fall? 0  Risk for fall due to : No Fall Risks  Follow up Falls evaluation completed     Most recent depression screenings:    01/19/2022    3:31 PM 09/02/2021    4:08 PM  PHQ 2/9 Scores  PHQ - 2 Score 0 0  PHQ- 9 Score 0     Dental: No current dental problems and No regular dental care   Patient Active Problem List   Diagnosis Date Noted   Encounter for annual general medical examination with abnormal findings in adult 01/19/2022   Polyuria 06/19/2021   Chronic cough 03/20/2021   Anxiety 02/23/2021   Protrusion of lumbar intervertebral disc 07/28/2020   Screening due 06/09/2020   Sarcoidosis 01/02/2020   Synovitis of knee 11/25/2019   Cervical radiculopathy 10/04/2019   Chronic low back pain 10/04/2019   Neck pain 10/04/2019   Bloating 08/27/2019   Gastroesophageal reflux disease 08/27/2019   Polyp of ascending colon    H/O adenomatous polyp of colon 02/28/2019   Upper abdominal pain 02/28/2019   Pain in both hands 05/04/2018   Annular tear of lumbar disc 03/29/2018   Trigger ring finger of left hand 08/24/2016   Bilateral trigger thumb 01/23/2015   Chest pain 02/14/2014   Acne scarring 02/21/2013   Biliary dyskinesia 11/07/2012   History of herniated intervertebral disc 11/07/2012   PONV (postoperative nausea and vomiting)  11/07/2012   Past Medical History:  Diagnosis Date   Adenomatous polyp of colon 08/2013   TWO, REDUNDANT LEFT COLON   Constipation by delayed colonic transit 08/23/2013   GERD (gastroesophageal reflux disease)    Phreesia 06/06/2020   H. pylori infection 09/2019   Treated with bismuth subsalicylate, tetracycline, metronidazole, and PPI twice daily x14 days.  Documented eradication November 2022 via breath test.   Helicobacter pylori gastritis 08/2013   ABO  BID FOR 10 DAYS   Neuromuscular disorder ([place])    chronic back pain   PONV (postoperative nausea and vomiting) 11/07/2012   Sarcoidosis    Sarcoidosis    Past Surgical History:  Procedure Laterality Date   APPENDECTOMY     BIOPSY  10/19/2019   Procedure: BIOPSY;  Surgeon: Eloise Harman, DO;  [facility];  Service: Endoscopy;;   CHOLECYSTECTOMY  12/2012   [facility]   COLONOSCOPY N/A 08/29/2013   SLF: Several simple adenomas removed, moderate sized internal hemorrhoids, slightly redundant left colon.   COLONOSCOPY N/A 05/18/2019   Procedure: COLONOSCOPY;  Surgeon: Danie Binder, MD;   Two 5-8 mm polyps in the proximal ascending and distal ascending colon, external hemorrhoids, tortuous left colon.  Pathology with sessile serrated polyps.  Recommended next colonoscopy in 5 years.     ESOPHAGOGASTRODUODENOSCOPY N/A 08/29/2013  SLF: H. pylori gastritis treated with amoxicillin/Biaxin/omeprazole   ESOPHAGOGASTRODUODENOSCOPY (EGD) WITH PROPOFOL N/A 10/19/2019   Surgeon: Eloise Harman, DO; H. pylori gastritis, benign duodenal biopsies.   NECK SURGERY     POLYPECTOMY  05/18/2019   Procedure: POLYPECTOMY;  Surgeon: Danie Binder, MD;  [facility];  Service: Endoscopy;;  ascending x2   TUBAL LIGATION     Social History   Tobacco Use   Smoking status: Never   Smokeless tobacco: Never  Vaping Use   Vaping Use: Never used  Substance Use Topics   Alcohol use: No   Drug use: No   Social History    Socioeconomic History   Marital status: Married    Spouse name: Not on file   Number of children: 3   Years of education: Not on file   Highest education level: Not on file  Occupational History   Occupation: Corporate treasurer    Comment: makes ranch dressing, pasta sauce, salsa, and other food produces   Occupation: Company secretary  Tobacco Use   Smoking status: Never   Smokeless tobacco: Never  Vaping Use   Vaping Use: Never used  Substance and Sexual Activity   Alcohol use: No   Drug use: No   Sexual activity: Not Currently  Other Topics Concern   Not on file  Social History Narrative   Not on file   Social Determinants of Health   Financial Resource Strain: Not on file  Food Insecurity: Not on file  Transportation Needs: Not on file  Physical Activity: Not on file  Stress: Not on file  Social Connections: Not on file  Intimate Partner Violence: Not on file   Family Status  Relation Name Status   Mother  Deceased   Father  Deceased   Neg Hx  (Not Specified)   Family History  Problem Relation Age of Onset   Heart attack Mother    Alcohol abuse Mother    Heart attack Father    Alcohol abuse Father    Colon cancer Neg Hx    No Known Allergies    Patient Care Team: Alvira Monday, FNP as PCP - General (Family Medicine) Eloise Harman, DO as Consulting Physician (Internal Medicine)   Outpatient Medications Prior to Visit  Medication Sig   [DISCONTINUED] albuterol (VENTOLIN HFA) 108 (90 Base) MCG/ACT inhaler Inhale 2 puffs into the lungs every 6 (six) hours as needed for wheezing or shortness of breath.   [DISCONTINUED] pantoprazole (PROTONIX) 40 MG tablet Take 1 tablet (40 mg total) by mouth 2 (two) times daily before a meal. TAKE 1 TABLET(40 MG) BY MOUTH TWICE DAILY   [DISCONTINUED] PARoxetine (PAXIL) 10 MG tablet Take 1 tablet (10 mg total) by mouth daily.   [DISCONTINUED] predniSONE (DELTASONE) 5 MG tablet Take 1 tablet (5 mg total) by mouth daily  with breakfast.   [DISCONTINUED] Vitamin D, Ergocalciferol, (DRISDOL) 1.25 MG (50000 UNIT) CAPS capsule Take 1 capsule (50,000 Units total) by mouth every 7 (seven) days.   No facility-administered medications prior to visit.    Review of Systems  Constitutional:  Negative for chills, fever and malaise/fatigue.  HENT:  Negative for congestion and sinus pain.   Eyes:  Negative for pain, discharge and redness.  Respiratory:  Negative for cough, sputum production and shortness of breath.   Cardiovascular:  Negative for chest pain, claudication and leg swelling.  Gastrointestinal:  Negative for diarrhea, heartburn and nausea.  Genitourinary:  Negative for flank pain and frequency.  Musculoskeletal:  Negative for back pain.  Skin:  Negative for itching.  Neurological:  Negative for dizziness, seizures and headaches.  Endo/Heme/Allergies:  Negative for environmental allergies.  Psychiatric/Behavioral:  Negative for memory loss. The patient does not have insomnia.        Objective:    BP 108/71   Pulse 76   Ht 5' (1.524 m)   Wt 137 lb (62.1 kg)   SpO2 95%   BMI 26.76 kg/m  BP Readings from Last 3 Encounters:  01/19/22 108/71  12/17/21 124/70  09/21/21 128/70   Wt Readings from Last 3 Encounters:  01/19/22 137 lb (62.1 kg)  12/17/21 134 lb 2.4 oz (60.9 kg)  09/21/21 138 lb (62.6 kg)      Physical Exam HENT:     Head: Normocephalic.     Left Ear: External ear normal.     Mouth/Throat:     Mouth: Mucous membranes are moist.  Eyes:     Extraocular Movements: Extraocular movements intact.     Pupils: Pupils are equal, round, and reactive to light.  Cardiovascular:     Rate and Rhythm: Normal rate and regular rhythm.     Heart sounds: No murmur heard. Pulmonary:     Effort: Pulmonary effort is normal.     Breath sounds: Normal breath sounds.  Abdominal:     Palpations: Abdomen is soft.     Tenderness: There is no right CVA tenderness or left CVA tenderness.   Musculoskeletal:     Right lower leg: No edema.     Left lower leg: No edema.  Skin:    Findings: No lesion or rash.  Neurological:     Mental Status: She is alert and oriented to person, place, and time.     GCS: GCS eye subscore is 4. GCS verbal subscore is 5. GCS motor subscore is 6.     Cranial Nerves: No facial asymmetry.     Sensory: No sensory deficit.     Motor: No weakness.     Coordination: Coordination normal. Finger-Nose-Finger Test normal.     Gait: Gait normal.  Psychiatric:        Judgment: Judgment normal.     No results found for any visits on 01/19/22. Last CBC Lab Results  Component Value Date   WBC 9.2 09/25/2021   HGB 13.3 09/25/2021   HCT 40.2 09/25/2021   MCV 91 09/25/2021   MCH 30.2 09/25/2021   RDW 11.8 09/25/2021   PLT 262 38/93/7342   Last metabolic panel Lab Results  Component Value Date   GLUCOSE 84 09/25/2021   NA 140 09/25/2021   K 4.5 09/25/2021   CL 101 09/25/2021   CO2 26 09/25/2021   BUN 15 09/25/2021   CREATININE 0.83 09/25/2021   EGFR 81 09/25/2021   CALCIUM 9.7 09/25/2021   PROT 6.9 09/25/2021   ALBUMIN 4.1 09/25/2021   LABGLOB 2.8 09/25/2021   AGRATIO 1.5 09/25/2021   BILITOT 0.3 09/25/2021   ALKPHOS 72 09/25/2021   AST 15 09/25/2021   ALT 13 09/25/2021   ANIONGAP 6 02/05/2019   Last lipids Lab Results  Component Value Date   CHOL 235 (H) 09/25/2021   HDL 55 09/25/2021   LDLCALC 160 (H) 09/25/2021   TRIG 114 09/25/2021   CHOLHDL 4.3 09/25/2021   Last hemoglobin A1c Lab Results  Component Value Date   HGBA1C 5.9 (H) 09/25/2021   Last thyroid functions Lab Results  Component Value Date   TSH 2.320 09/25/2021  Last vitamin D Lab Results  Component Value Date   VD25OH 24.9 (L) 09/25/2021   Last vitamin B12 and Folate No results found for: "VITAMINB12", "FOLATE"      Assessment & Plan:    Routine Health Maintenance and Physical Exam  Immunization History  Administered Date(s) Administered    Influenza-Unspecified 01/16/2014, 12/03/2014, 12/03/2019, 11/22/2020   Pneumococcal Polysaccharide-23 02/27/2013   Tdap 06/01/2021    Health Maintenance  Topic Date Due   COVID-19 Vaccine (1) 02/04/2022 (Originally 12/11/1960)   Zoster Vaccines- Shingrix (1 of 2) 04/20/2022 (Originally 06/11/2011)   INFLUENZA VACCINE  05/02/2022 (Originally 09/01/2021)   PAP SMEAR-Modifier  08/16/2022   MAMMOGRAM  07/17/2023   COLONOSCOPY (Pts 45-28yr Insurance coverage will need to be confirmed)  05/17/2029   DTaP/Tdap/Td (2 - Td or Tdap) 06/02/2031   Hepatitis C Screening  Completed   HIV Screening  Completed   HPV VACCINES  Aged Out    Discussed health benefits of physical activity, and encouraged her to engage in regular exercise appropriate for her age and condition.  Encounter for annual general medical examination with abnormal findings in adult Assessment & Plan: Physical exam as documented Counseling is done on healthy lifestyle involving commitment to 150 minutes of exercise per week,  Discussed heart-healthy diet      Cervical radiculopathy Assessment & Plan: She complains of neck pain No recent injury or trauma to the neck Will treat with Flexeril Encouraged to take at bedtime due to side effects of drowsiness    Orders: -     Cyclobenzaprine HCl; Take 1 tablet (5 mg total) by mouth 3 (three) times daily as needed for muscle spasms.  Dispense: 30 tablet; Refill: 1  Vitamin D deficiency -     VITAMIN D 25 Hydroxy (Vit-D Deficiency, Fractures) -     Vitamin D (Ergocalciferol); Take 1 capsule (50,000 Units total) by mouth every 7 (seven) days.  Dispense: 7 capsule; Refill: 2  Prediabetes -     Hemoglobin A1c  Gastroesophageal reflux disease without esophagitis -     Pantoprazole Sodium; Take 1 tablet (40 mg total) by mouth as needed. TAKE 1 TABLET(40 MG) BY MOUTH TWICE DAILY  Dispense: 60 tablet; Refill: 2  Hypercholesteremia -     Lipid panel -     CMP14+EGFR -     CBC  with Differential/Platelet  IFG (impaired fasting glucose)  Other specified hypothyroidism -     TSH + free T4  SOB (shortness of breath) -     Albuterol Sulfate HFA; Inhale 2 puffs into the lungs every 6 (six) hours as needed for wheezing or shortness of breath.  Dispense: 8 g; Refill: 2    No follow-ups on file.     GAlvira Monday FNP

## 2022-01-19 NOTE — Patient Instructions (Addendum)
I appreciate the opportunity to provide care to you today!    Follow up:  4 months  Fasting Labs: please stop by the lab today/ during the week  to get your blood drawn (CBC, CMP, TSH, Lipid profile, HgA1c, Vit D)    Please pick up your mediations at the pharmacy  Physical activity helps: Lower your blood glucose, improve your heart health, lower your blood pressure and cholesterol, burn calories to help manage her weight, gave you energy, lower stress, and improve his sleep.  The American diabetes Association (ADA) recommends being active for 2-1/2 hours (150 minutes) or more week.  Exercise for 30 minutes, 5 days a week (150 minutes total)     Please continue to a heart-healthy diet and increase your physical activities. Try to exercise for 50mns at least five times a week.      It was a pleasure to see you and I look forward to continuing to work together on your health and well-being. Please do not hesitate to call the office if you need care or have questions about your care.   Have a wonderful day and week. With Gratitude, GAlvira MondayMSN, FNP-BC

## 2022-01-19 NOTE — Assessment & Plan Note (Signed)
She complains of neck pain No recent injury or trauma to the neck Will treat with Flexeril Encouraged to take at bedtime due to side effects of drowsiness

## 2022-01-19 NOTE — Assessment & Plan Note (Signed)
Physical exam as documented Counseling is done on healthy lifestyle involving commitment to 150 minutes of exercise per week,  Discussed heart-healthy diet

## 2022-01-21 ENCOUNTER — Telehealth: Payer: Self-pay

## 2022-01-21 NOTE — Telephone Encounter (Signed)
Pt called stating that she received her h pylori breath test results via myChart and would like to know what to do.

## 2022-01-22 ENCOUNTER — Other Ambulatory Visit: Payer: Self-pay | Admitting: Internal Medicine

## 2022-01-22 DIAGNOSIS — K219 Gastro-esophageal reflux disease without esophagitis: Secondary | ICD-10-CM

## 2022-01-22 MED ORDER — TETRACYCLINE HCL 500 MG PO CAPS: 500.0000 mg | ORAL_CAPSULE | Freq: Four times a day (QID) | ORAL | 0 refills | Status: AC

## 2022-01-22 MED ORDER — METRONIDAZOLE 500 MG PO TABS: 500.0000 mg | ORAL_TABLET | Freq: Three times a day (TID) | ORAL | 0 refills | Status: AC

## 2022-01-22 MED ORDER — BISMUTH 262 MG PO CHEW: 2.0000 | CHEWABLE_TABLET | Freq: Four times a day (QID) | ORAL | 0 refills | Status: AC

## 2022-01-22 MED ORDER — PANTOPRAZOLE SODIUM 40 MG PO TBEC: 40.0000 mg | DELAYED_RELEASE_TABLET | Freq: Two times a day (BID) | ORAL | 11 refills | Status: DC

## 2022-01-26 ENCOUNTER — Telehealth: Payer: Self-pay | Admitting: Family Medicine

## 2022-01-26 ENCOUNTER — Other Ambulatory Visit: Payer: Self-pay

## 2022-01-26 DIAGNOSIS — R053 Chronic cough: Secondary | ICD-10-CM

## 2022-01-26 DIAGNOSIS — R0602 Shortness of breath: Secondary | ICD-10-CM

## 2022-01-26 NOTE — Progress Notes (Signed)
refer

## 2022-01-26 NOTE — Telephone Encounter (Signed)
Referral sent 

## 2022-01-26 NOTE — Telephone Encounter (Signed)
Patient called in requesting referral to [facility] for Pulmonary   Fax [phone]

## 2022-01-27 NOTE — Telephone Encounter (Signed)
Pt states that she did not take the H Pylori meds that she was prescribed today due to it making her feel sick to where she can't get out of bed. Pt states that today was her first day back to work from the holidays so she did not take the medication. Pt is wanting to know if there is a different option. States that she will try it again but that she is not sure what to do about how it makes her feel.

## 2022-01-29 NOTE — Telephone Encounter (Signed)
Would recommend she continue to try to take abx for full 14 days

## 2022-02-02 NOTE — Telephone Encounter (Signed)
Pt was made aware.  

## 2022-02-10 ENCOUNTER — Encounter: Payer: Self-pay | Admitting: Internal Medicine

## 2022-02-10 ENCOUNTER — Ambulatory Visit (INDEPENDENT_AMBULATORY_CARE_PROVIDER_SITE_OTHER): Admitting: Internal Medicine

## 2022-02-10 VITALS — BP 116/71 | HR 86 | Temp 97.9°F | Ht 60.0 in | Wt 137.1 lb

## 2022-02-10 DIAGNOSIS — R1013 Epigastric pain: Secondary | ICD-10-CM | POA: Diagnosis not present

## 2022-02-10 DIAGNOSIS — K219 Gastro-esophageal reflux disease without esophagitis: Secondary | ICD-10-CM | POA: Diagnosis not present

## 2022-02-10 DIAGNOSIS — R1010 Upper abdominal pain, unspecified: Secondary | ICD-10-CM

## 2022-02-10 DIAGNOSIS — K2970 Gastritis, unspecified, without bleeding: Secondary | ICD-10-CM

## 2022-02-10 DIAGNOSIS — B9681 Helicobacter pylori [H. pylori] as the cause of diseases classified elsewhere: Secondary | ICD-10-CM

## 2022-02-10 NOTE — Progress Notes (Signed)
Referring Provider: Alvira Monday, [place] Primary Care Physician:  Alvira Monday, FNP Primary GI:  Dr. Abbey Chatters  Chief Complaint  Patient presents with   Gastroesophageal Reflux    Follow up on GERD and positive h pylori breath test. Reports she is still taking tetracycline qid and flagyl tid.    HPI:   Terri Newman is a 62 y.o. female who presents to the clinic today for follow-up visit.  She has a history of chronic GERD, dyspepsia, H. pylori gastritis initially diagnosed in 2015 treated multiple times with negative breath test November 2022, cholecystectomy, adenomatous colon polyps.  Recently had resurgence of symptoms of epigastric pain.  Underwent H. pylori breath testing 01/18/2022 which was positive.  Has a few days left of treatment with tetracycline and metronidazole.  Currently maintained on pantoprazole twice daily.  States her symptoms are still apparent.  Notes epigastric pain, mild to moderate severity.  Intermittent 1-2 times per week.  Past Medical History:  Diagnosis Date   Adenomatous polyp of colon 08/2013   TWO, REDUNDANT LEFT COLON   Constipation by delayed colonic transit 08/23/2013   GERD (gastroesophageal reflux disease)    Phreesia 06/06/2020   H. pylori infection 09/2019   Treated with bismuth subsalicylate, tetracycline, metronidazole, and PPI twice daily x14 days.  Documented eradication November 2022 via breath test.   Helicobacter pylori gastritis 08/2013   ABO  BID FOR 10 DAYS   Neuromuscular disorder ([place])    chronic back pain   PONV (postoperative nausea and vomiting) 11/07/2012   Sarcoidosis    Sarcoidosis     Past Surgical History:  Procedure Laterality Date   APPENDECTOMY     BIOPSY  10/19/2019   Procedure: BIOPSY;  Surgeon: Eloise Harman, DO;  [facility];  Service: Endoscopy;;   CHOLECYSTECTOMY  12/2012   [facility]   COLONOSCOPY N/A 08/29/2013   SLF: Several simple adenomas removed, moderate sized internal  hemorrhoids, slightly redundant left colon.   COLONOSCOPY N/A 05/18/2019   Procedure: COLONOSCOPY;  Surgeon: Danie Binder, MD;   Two 5-8 mm polyps in the proximal ascending and distal ascending colon, external hemorrhoids, tortuous left colon.  Pathology with sessile serrated polyps.  Recommended next colonoscopy in 5 years.     ESOPHAGOGASTRODUODENOSCOPY N/A 08/29/2013   SLF: H. pylori gastritis treated with amoxicillin/Biaxin/omeprazole   ESOPHAGOGASTRODUODENOSCOPY (EGD) WITH PROPOFOL N/A 10/19/2019   Surgeon: Eloise Harman, DO; H. pylori gastritis, benign duodenal biopsies.   NECK SURGERY     POLYPECTOMY  05/18/2019   Procedure: POLYPECTOMY;  Surgeon: Danie Binder, MD;  [facility];  Service: Endoscopy;;  ascending x2   TUBAL LIGATION      Current Outpatient Medications  Medication Sig Dispense Refill   albuterol (VENTOLIN HFA) 108 (90 Base) MCG/ACT inhaler Inhale 2 puffs into the lungs every 6 (six) hours as needed for wheezing or shortness of breath. 8 g 2   cyclobenzaprine (FLEXERIL) 5 MG tablet Take 1 tablet (5 mg total) by mouth 3 (three) times daily as needed for muscle spasms. 30 tablet 1   metroNIDAZOLE (FLAGYL) 500 MG tablet Take 500 mg by mouth 3 (three) times daily.     pantoprazole (PROTONIX) 40 MG tablet Take 1 tablet (40 mg total) by mouth 2 (two) times daily. TAKE 1 TABLET(40 MG) BY MOUTH TWICE DAILY 60 tablet 11   tetracycline (SUMYCIN) 500 MG capsule Take 500 mg by mouth 4 (four) times daily.     Vitamin D, Ergocalciferol, (  DRISDOL) 1.25 MG (50000 UNIT) CAPS capsule Take 1 capsule (50,000 Units total) by mouth every 7 (seven) days. (Patient not taking: Reported on 02/10/2022) 7 capsule 2   No current facility-administered medications for this visit.    Allergies as of 02/10/2022   (No Known Allergies)    Family History  Problem Relation Age of Onset   Heart attack Mother    Alcohol abuse Mother    Heart attack Father    Alcohol abuse Father     Colon cancer Neg Hx     Social History   Socioeconomic History   Marital status: Married    Spouse name: Not on file   Number of children: 3   Years of education: Not on file   Highest education level: Not on file  Occupational History   Occupation: Corporate treasurer    Comment: makes ranch dressing, pasta sauce, salsa, and other food produces   Occupation: Company secretary  Tobacco Use   Smoking status: Never    Passive exposure: Never   Smokeless tobacco: Never  Vaping Use   Vaping Use: Never used  Substance and Sexual Activity   Alcohol use: No   Drug use: No   Sexual activity: Not Currently  Other Topics Concern   Not on file  Social History Narrative   Not on file   Social Determinants of Health   Financial Resource Strain: Not on file  Food Insecurity: Not on file  Transportation Needs: Not on file  Physical Activity: Not on file  Stress: Not on file  Social Connections: Not on file    Subjective: Review of Systems  Constitutional:  Negative for chills and fever.  HENT:  Negative for congestion and hearing loss.   Eyes:  Negative for blurred vision and double vision.  Respiratory:  Negative for cough and shortness of breath.   Cardiovascular:  Negative for chest pain and palpitations.  Gastrointestinal:  Positive for abdominal pain and heartburn. Negative for blood in stool, constipation, diarrhea, melena and vomiting.  Genitourinary:  Negative for dysuria and urgency.  Musculoskeletal:  Negative for joint pain and myalgias.  Skin:  Negative for itching and rash.  Neurological:  Negative for dizziness and headaches.  Psychiatric/Behavioral:  Negative for depression. The patient is not nervous/anxious.      Objective: BP 116/71 (BP Location: Left Arm, Patient Position: Sitting, Cuff Size: Normal)   Pulse 86   Temp 97.9 F (36.6 C) (Oral)   Ht 5' (1.524 m)   Wt 137 lb 1.6 oz (62.2 kg)   BMI 26.78 kg/m  Physical Exam Constitutional:       Appearance: Normal appearance.  HENT:     Head: Normocephalic and atraumatic.  Eyes:     Extraocular Movements: Extraocular movements intact.     Conjunctiva/sclera: Conjunctivae normal.  Cardiovascular:     Rate and Rhythm: Normal rate and regular rhythm.  Pulmonary:     Effort: Pulmonary effort is normal.     Breath sounds: Normal breath sounds.  Abdominal:     General: Bowel sounds are normal.     Palpations: Abdomen is soft.  Musculoskeletal:        General: No swelling. Normal range of motion.     Cervical back: Normal range of motion and neck supple.  Skin:    General: Skin is warm and dry.     Coloration: Skin is not jaundiced.  Neurological:     General: No focal deficit present.     Mental  Status: She is alert and oriented to person, place, and time.  Psychiatric:        Mood and Affect: Mood normal.        Behavior: Behavior normal.      Assessment: *H. pylori gastritis *Epigastric pain *Chronic GERD *Dyspepsia  Plan: Patient with refractory H. pylori gastritis.  Currently on antibiotic regimen with metronidazole, tetracycline, pantoprazole twice daily, bismuth.  Continue to completion.  Will order breath testing to be performed in 4 to 6 weeks.  Patient will need to hold pantoprazole at least 10 days prior.  If H. pylori successfully eradicated and patient still having symptoms, consider different PPI therapy such as Dexilant.  Colonoscopy recall 2026.  Follow-up with GI in 3 to 4 months.  02/10/2022 3:33 PM   Disclaimer: This note was dictated with voice recognition software. Similar sounding words can inadvertently be transcribed and may not be corrected upon review.

## 2022-02-10 NOTE — Patient Instructions (Signed)
Continue your antibiotics until completion.  Continue on pantoprazole twice daily.  I will order repeat H. pylori breath testing to be done at [facility] in approximately 4 weeks.  You will need to hold your pantoprazole for 10 days prior to testing.  Okay to take Pepcid during this time.  Once you have completed the test, you can immediately restart your pantoprazole.  If still having issues and H. pylori has been successfully eradicated, we will consider different PPI such as Dexilant.  Follow-up with GI in 3 to 4 months.  It was very nice seeing you again today.  Dr. Abbey Chatters

## 2022-02-26 ENCOUNTER — Ambulatory Visit (INDEPENDENT_AMBULATORY_CARE_PROVIDER_SITE_OTHER): Admitting: Pulmonary Disease

## 2022-02-26 ENCOUNTER — Encounter (HOSPITAL_BASED_OUTPATIENT_CLINIC_OR_DEPARTMENT_OTHER): Payer: Self-pay | Admitting: Pulmonary Disease

## 2022-02-26 VITALS — BP 108/68 | HR 75 | Ht 60.0 in | Wt 136.0 lb

## 2022-02-26 DIAGNOSIS — D869 Sarcoidosis, unspecified: Secondary | ICD-10-CM | POA: Diagnosis not present

## 2022-02-26 MED ORDER — FLUTICASONE-SALMETEROL 115-21 MCG/ACT IN AERO: 2.0000 | INHALATION_SPRAY | Freq: Two times a day (BID) | RESPIRATORY_TRACT | 12 refills | Status: DC

## 2022-02-26 NOTE — Progress Notes (Unsigned)
Subjective:   PATIENT ID: Terri Newman GENDER: female DOB: 04-17-1960, MRN: [id number]   HPI  Chief Complaint  Patient presents with   Follow-up    Coughing, wheezing and fatigue since stopped prednisone    Reason for Visit: Follow-up sarcoid  Terri Newman is a 62 year old female with sarcoidosis, GERD, hx H. Pylori who presents for follow-up.  01/09/21 She was previously seen by Dr. Melvyn Novas since 01/2020 for sarcoid management. Was followed by Luan Pulling before this. She was diagnosed with sarcoidosis in 2013 via mediastinoscopy at [facility]. She was treated with prednisone for 8 months with no recurrence off steroids. She had reported unproductive cough on this visit. Prescribed PPI for possible reflux.  01/09/21 Today, she reports has had chronic nonproductive cough for last five months. She will have cough spells that will suddenly start. She occasionally has nasal congestion. She saw GI last month and work-up was negative including neg H.pylori, will follow-up next year.  Recent cardiac work-up also neg. Cough does not interfere with her activities. She reports fatigue but attributes this to sleeping five hours a night.  02/17/21 Since our last visit she has an unproductive cough with occasional morning sputum that is unchanged. Cough was her primary symptoms with sarcoid flare. She reports fatigue but still able to work (lift boxes, walking frequently). She doesn't sleep well. She reports feeling anxious.  03/18/21 Since our last visit she started albuterol 2 puffs once a day with improvement cough. She continues to have throat clearing that she attributes to her reflux. She is taking as needed omeprazole. Less anxious now and sleeping better so has not taken her paxil. She is not as fatigued since her cough is better controlled.  06/16/21 Started on prednisone 10 mg x 3 months. Coughing has improved but not resolved. Continues to have mid chest discomfort. She does report  needing to use the bathroom more often and needing to urinate since starting steroids. Denies dizziness, increased thirst, visual changes, headaches.  09/21/21 Since last visit, she was increased to prednisone 20 mg. Has tolerated well except for weight gain (12lb). Cough better but can be triggered with food or high intensity activity. Previously had chest pain that has improved. She is currently on Augmentin for an ear infection.  12/17/21 Currently on prednisone 10 mg daily. Tolerating well. Has some shortness of breath that requires albuterol once a month. Denies wheezing. Occasional cough. Has been feeling anxious lately and requesting anti-anxiety med but not wanting anything sedation.  02/26/22 Since our last visit she has been weaned off prednisone since end of December. She reports fatigue has worsened, cough has returned and is worse in the morning. Has occasional wheezing now. Denies recent sickness. Has been required albuterol once a month. Also reports palpitations.  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 pylori infection 09/2019   Treated with bismuth subsalicylate, tetracycline, metronidazole, and PPI twice daily x14 days.  Documented eradication November 2022 via breath test.   Helicobacter pylori gastritis 08/2013   ABO  BID FOR 10 DAYS   Neuromuscular disorder (HCC)    chronic back pain   PONV (postoperative nausea and vomiting) 11/07/2012   Sarcoidosis  Sarcoidosis      Family History  Problem Relation Age of Onset   Heart attack Mother    Alcohol abuse Mother    Heart attack Father    Alcohol abuse Father    Colon cancer Neg Hx      Social History   Occupational History    Occupation: Corporate treasurer    Comment: makes ranch dressing, pasta sauce, salsa, and other food produces   Occupation: Company secretary  Tobacco Use   Smoking status: Never    Passive exposure: Never   Smokeless tobacco: Never  Vaping Use   Vaping Use: Never used  Substance and Sexual Activity   Alcohol use: No   Drug use: No   Sexual activity: Not Currently    No Known Allergies   Outpatient Medications Prior to Visit  Medication Sig Dispense Refill   albuterol (VENTOLIN HFA) 108 (90 Base) MCG/ACT inhaler Inhale 2 puffs into the lungs every 6 (six) hours as needed for wheezing or shortness of breath. 8 g 2   cyclobenzaprine (FLEXERIL) 5 MG tablet Take 1 tablet (5 mg total) by mouth 3 (three) times daily as needed for muscle spasms. 30 tablet 1   metroNIDAZOLE (FLAGYL) 500 MG tablet Take 500 mg by mouth 3 (three) times daily.     pantoprazole (PROTONIX) 40 MG tablet Take 1 tablet (40 mg total) by mouth 2 (two) times daily. TAKE 1 TABLET(40 MG) BY MOUTH TWICE DAILY 60 tablet 11   tetracycline (SUMYCIN) 500 MG capsule Take 500 mg by mouth 4 (four) times daily.     Vitamin D, Ergocalciferol, (DRISDOL) 1.25 MG (50000 UNIT) CAPS capsule Take 1 capsule (50,000 Units total) by mouth every 7 (seven) days. 7 capsule 2   No facility-administered medications prior to visit.    Review of Systems  Constitutional:  Positive for malaise/fatigue. Negative for chills, diaphoresis, fever and weight loss.  HENT:  Negative for congestion.   Respiratory:  Positive for cough and wheezing. Negative for hemoptysis, sputum production and shortness of breath.   Cardiovascular:  Negative for chest pain, palpitations and leg swelling.     Objective:   Vitals:   02/26/22 1426  BP: 108/68  Pulse: 75  SpO2: 98%  Weight: 136 lb (61.7 kg)  Height: 5' (1.524 m)    SpO2: 98 % O2 Device: None (Room air)  Physical Exam: General: Well-appearing, no acute distress HENT: , AT Eyes: EOMI, no  scleral icterus Respiratory: Clear to auscultation bilaterally.  No crackles, wheezing or rales Cardiovascular: RRR, -M/R/G, no JVD Extremities:-Edema,-tenderness Neuro: AAO x4, CNII-XII grossly intact Psych: Normal mood, normal affect  Data Reviewed:  Imaging: CXR 10/15/20 - Normal. No adenopathy. No infiltrate effusion or edema PET/CT 03/10/21 - Normal cardiac perfusion and function. Hypermetabolic activity in mediastinal adenopathy and bilateral lower lobe opacities. No pleural effusions. PET/CT 12/03/21 - Low-level avidity, nonspecific. No hypermetabolic activity in adenopathy. Interval visualization of mild pulmonary fibrosis with apical to basal gradient with associated ground glass and some traction bronchiectasis. No honeycombing.  PFT: 01/21/14 FVC 2.65 (84%) FEV1 2.36 (95%) Ratio 86  TLC 92% DLCO 82% Interpretation: Normal spirometry  02/10/21 FVC 1.72 (60%) FEV1 1.55 (70%) Ratio 88 TLC 76% DLCO 54% Interpretation: Mild restrictive lung defect with moderately reduced diffusing capacity. No significant bronchodilator response. Small air disease present.   Labs: CBC    Component Value Date/Time   WBC 9.2 09/25/2021 0832   WBC 7.6 03/18/2021 1447   RBC 4.40 09/25/2021 0832  RBC 3.82 (L) 03/18/2021 1447   HGB 13.3 09/25/2021 0832   HCT 40.2 09/25/2021 0832   HCT 37 08/05/2017 0746   PLT 262 09/25/2021 0832   MCV 91 09/25/2021 0832   MCV 88.6 08/05/2017 0746   MCH 30.2 09/25/2021 0832   MCH 29.0 09/07/2019 0852   MCHC 33.1 09/25/2021 0832   MCHC 32.9 03/18/2021 1447   RDW 11.8 09/25/2021 0832   RDW 12.6 08/05/2017 0746   LYMPHSABS 3.3 (H) 09/25/2021 0832   MONOABS 0.4 03/18/2021 1447   EOSABS 0.1 09/25/2021 0832   EOSABS 80 08/05/2017 0746   BASOSABS 0.0 09/25/2021 0832      Latest Ref Rng & Units 09/25/2021    8:32 AM 06/16/2021    4:14 PM 03/18/2021    2:47 PM  CMP  Glucose 70 - 99 mg/dL 84  100  84   BUN 8 - 27 mg/dL '15  17  16   '$ Creatinine 0.57 - 1.00 mg/dL  0.83  0.92  0.87   Sodium 134 - 144 mmol/L 140  136  136   Potassium 3.5 - 5.2 mmol/L 4.5  5.1  4.7   Chloride 96 - 106 mmol/L 101  100  102   CO2 20 - 29 mmol/L '26  30  31   '$ Calcium 8.7 - 10.3 mg/dL 9.7  9.8  10.1   Total Protein 6.0 - 8.5 g/dL 6.9   8.5   Total Bilirubin 0.0 - 1.2 mg/dL 0.3   0.4   Alkaline Phos 44 - 121 IU/L 72   58   AST 0 - 40 IU/L 15   17   ALT 0 - 32 IU/L 13   12    EKG: 01/02/21 NSR  Assessment & Plan:   Discussion: 62 year old female with sarcoidosis, GERD, hx H pylori who presents for follow-up. Previously on steroids with controlled respiratory symptoms however cough returned after stopping steroids one month ago. Cough may be related to sarcoid vs fibrosis. Recommend surveillance with PFTs and CT imaging. If progression, will need to reconsider immunosuppression with biologic. Discussed acthar and methotrexate.  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 Hx of side effects (agitation, sleep, weight gain) - Annual PFTs. Last PFTs 02/2021 - Recommend annual ophthalmology exam.  Last visit unknown - EKG  reviewed. No evidence of conduction abnormalities.  - PET/CT 03/12/21 with active pulmonary sarcoid - PET/CT 12/03/21 - low-level avidity. Improved flare. 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 Rinse mouth out after use to prevent thrush --CONTINUE Albuterol AS NEEDED    Health Maintenance Immunization History  Administered Date(s)  Administered   Influenza-Unspecified 01/16/2014, 12/03/2014, 12/03/2019, 11/22/2020   Pneumococcal Polysaccharide-23 02/27/2013   Tdap 04/15/2021, 06/01/2021   CT Lung Screen - never smoker  No orders of the defined types were placed in this encounter.  Meds ordered this encounter  Medications   fluticasone-salmeterol (ADVAIR HFA) 115-21 MCG/ACT inhaler    Sig: Inhale 2 puffs into the lungs 2 (two) times daily.    Dispense:  1  each    Refill:  12    Generic ok   No follow-ups on file. May after PFTs  I have spent a total time of 32-minutes on the day of the appointment including chart review, data review, collecting history, coordinating care and discussing medical diagnosis and plan with the patient/family. Past medical history, allergies, medications were reviewed. Pertinent imaging, labs and tests included in this note have been reviewed and interpreted independently by me.  [place], MD [facility] 02/26/2022 2:48 PM  Office Number [phone]

## 2022-02-26 NOTE — Patient Instructions (Addendum)
Sarcoid --CT Chest without contrast in May 16th --Schedule pulmonary function test on May 17th before clinic visit  Chronic cough 2/2 sarcoid +/- fibrosis --START Advair 115-21 mcg TWO puffs in the morning and evening. Rinse mouth out after use to prevent thrush --CONTINUE Albuterol AS NEEDED   Schedule follow-up with me on 5/17 with PFTs prior to visit

## 2022-03-01 ENCOUNTER — Encounter (HOSPITAL_BASED_OUTPATIENT_CLINIC_OR_DEPARTMENT_OTHER): Payer: Self-pay | Admitting: Pulmonary Disease

## 2022-03-31 ENCOUNTER — Telehealth: Payer: Self-pay

## 2022-03-31 NOTE — Telephone Encounter (Signed)
Returned the pt's call, no ans. LMOVM

## 2022-04-05 ENCOUNTER — Telehealth: Payer: Self-pay

## 2022-04-05 NOTE — Telephone Encounter (Signed)
Pt phoned today wanting to do testing for H Pylori because she finished the medication back in January. Please advise

## 2022-04-07 ENCOUNTER — Other Ambulatory Visit: Payer: Self-pay | Admitting: Internal Medicine

## 2022-04-07 DIAGNOSIS — B9681 Helicobacter pylori [H. pylori] as the cause of diseases classified elsewhere: Secondary | ICD-10-CM

## 2022-04-07 NOTE — Telephone Encounter (Signed)
H. pylori breath test ordered at [place].  Patient to hold her PPI for at least 10 days prior to testing.  No Pepto during this time either.  Thank you

## 2022-04-08 NOTE — Telephone Encounter (Signed)
Phoned the pt and LMOVM for the pt to return call

## 2022-04-08 NOTE — Telephone Encounter (Signed)
Spoke with the pt and advised of her instructions. Pt expressed understanding

## 2022-04-15 ENCOUNTER — Telehealth: Payer: Self-pay

## 2022-04-15 NOTE — Telephone Encounter (Signed)
Returned the pt's call and no ans/vm.

## 2022-04-21 LAB — H. PYLORI BREATH TEST: H pylori Breath Test: POSITIVE — AB

## 2022-04-28 ENCOUNTER — Telehealth: Payer: Self-pay

## 2022-04-28 NOTE — Telephone Encounter (Signed)
Pt phoned to advise that she seen her pathology report and she states she is still positive for H Pylori. Pt wants to know what to do next. Should I send her in Rx from before. Please advise

## 2022-05-04 ENCOUNTER — Other Ambulatory Visit: Payer: Self-pay

## 2022-05-04 MED ORDER — BISMUTH/METRONIDAZ/TETRACYCLIN 140-125-125 MG PO CAPS: 3.0000 | ORAL_CAPSULE | Freq: Four times a day (QID) | ORAL | 0 refills | Status: DC

## 2022-05-04 NOTE — Progress Notes (Signed)
error 

## 2022-05-04 NOTE — Telephone Encounter (Signed)
Dr Abbey Chatters,   Pt has called again regarding her pathology/labs. Pt tested positive again for H Pylori. I tried again to phone the pt but had to leave a VM for her. Please advise

## 2022-05-06 NOTE — Telephone Encounter (Signed)
noted 

## 2022-05-18 ENCOUNTER — Other Ambulatory Visit: Payer: Self-pay

## 2022-05-18 DIAGNOSIS — B9681 Helicobacter pylori [H. pylori] as the cause of diseases classified elsewhere: Secondary | ICD-10-CM

## 2022-05-18 NOTE — Telephone Encounter (Signed)
She can take tylenol prn as directed on bottle. Would avoid Ibuprofen/advil/aleve/motrin. Hopefully she isn't having any further headaches.

## 2022-05-25 ENCOUNTER — Ambulatory Visit: Admitting: Family Medicine

## 2022-06-05 LAB — H. PYLORI BREATH TEST: H pylori Breath Test: NEGATIVE

## 2022-06-16 ENCOUNTER — Ambulatory Visit: Admitting: Internal Medicine

## 2022-06-17 ENCOUNTER — Ambulatory Visit (HOSPITAL_BASED_OUTPATIENT_CLINIC_OR_DEPARTMENT_OTHER)
Admission: RE | Admit: 2022-06-17 | Discharge: 2022-06-17 | Disposition: A | Discharge status: Home / Self Care | Source: Ambulatory Visit | Attending: Pulmonary Disease | Admitting: Pulmonary Disease

## 2022-06-17 DIAGNOSIS — D869 Sarcoidosis, unspecified: Secondary | ICD-10-CM | POA: Diagnosis not present

## 2022-06-22 ENCOUNTER — Encounter: Payer: Self-pay | Admitting: Pulmonary Disease

## 2022-07-12 ENCOUNTER — Encounter (HOSPITAL_BASED_OUTPATIENT_CLINIC_OR_DEPARTMENT_OTHER)

## 2022-07-12 ENCOUNTER — Encounter (HOSPITAL_BASED_OUTPATIENT_CLINIC_OR_DEPARTMENT_OTHER): Payer: Self-pay | Admitting: Pulmonary Disease

## 2022-07-12 ENCOUNTER — Ambulatory Visit (INDEPENDENT_AMBULATORY_CARE_PROVIDER_SITE_OTHER): Admitting: Pulmonary Disease

## 2022-07-12 VITALS — BP 114/66 | HR 76 | Temp 98.9°F | Ht 60.0 in | Wt 127.6 lb

## 2022-07-12 DIAGNOSIS — D869 Sarcoidosis, unspecified: Secondary | ICD-10-CM | POA: Diagnosis not present

## 2022-07-12 NOTE — Patient Instructions (Addendum)
  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 Rinse mouth out after use to prevent thrush --CONTINUE Albuterol AS NEEDED

## 2022-07-12 NOTE — Progress Notes (Signed)
Subjective:   PATIENT ID: Terri Newman GENDER: female DOB: 1960-11-02, MRN: [id number]   HPI  Chief Complaint  Patient presents with   Follow-up    Follow up. Patient has no complaints.     Reason for Visit: Follow-up sarcoid  [name] is a 62 year old female with sarcoidosis, GERD, hx H. Pylori who presents for follow-up.  01/09/21 She was previously seen by Dr. Sherene Sires since 01/2020 for sarcoid management. Was followed by Juanetta Gosling before this. She was diagnosed with sarcoidosis in 2013 via mediastinoscopy at [facility]. She was treated with prednisone for 8 months with no recurrence off steroids. Cough was her primary symptoms with sarcoid flare. She reports fatigue but still able to work (lift boxes, walking frequently)  2022 - chronic nonproductive cough for last five months. GI work-up was negative including neg H.pylori. Recent cardiac work-up also neg. Cough does not interfere with her activities. She reports fatigue but attributes this to sleeping five hours a night. 2023 - Prednisone 10 mg x 3 months. Coughing has improved but not resolved.Increased to prednisone 20 mg. Has tolerated well except for weight gain (12lb). Weaned back to prednisone 10 mg daily. Tolerating well. Has some shortness of breath that requires albuterol once a month. Weaned off steroids in December 2024 - Started on Advair with improvement in cough. Fatigue has worsened  07/12/22 Since our last visit she has been started on Advair. She reports cough occurs every once a while compared to daily before. Not needing rescue inhaler. No shortness of breath or wheezing. Some fatigue that is unchanged.  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 pylori infection 09/2019   Treated with bismuth subsalicylate, tetracycline, metronidazole, and PPI twice daily x14 days.  Documented eradication November 2022 via breath test.   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 8 g 2   cyclobenzaprine (FLEXERIL) 5 MG tablet Take 1 tablet (5 mg total) by mouth 3 (three) times daily as needed for muscle spasms. 30 tablet  1   fluticasone-salmeterol (ADVAIR HFA) 115-21 MCG/ACT inhaler Inhale 2 puffs into the lungs 2 (two) times daily. 1 each 12   pantoprazole (PROTONIX) 40 MG tablet Take 1 tablet (40 mg total) by mouth 2 (two) times daily. TAKE 1 TABLET(40 MG) BY MOUTH TWICE DAILY 60 tablet 11   Vitamin D, Ergocalciferol, (DRISDOL) 1.25 MG (50000 UNIT) CAPS capsule Take 1 capsule (50,000 Units total) by mouth every 7 (seven) days. 7 capsule 2   Bismuth/Metronidaz/Tetracyclin (PYLERA) 140-125-125 MG CAPS Take 3 capsules by mouth in the morning,  at noon, in the evening, and at bedtime for 10 days. 120 capsule 0   No facility-administered medications prior to visit.    Review of Systems  Constitutional:  Positive for malaise/fatigue. Negative for chills, diaphoresis, fever and weight loss.  HENT:  Negative for congestion.   Respiratory:  Negative for cough, hemoptysis, sputum production, shortness of breath and wheezing.   Cardiovascular:  Negative for chest pain, palpitations and leg swelling.     Objective:   Vitals:   07/12/22 1341  BP: 114/66  Pulse: 76  Temp: 98.9 F (37.2 C)  TempSrc: Oral  SpO2: 96%  Weight: 127 lb 9.6 oz (57.9 kg)  Height: 5' (1.524 m)    SpO2: 96 % O2 Device: None (Room air)  Physical Exam: General: Well-appearing, no acute distress HENT: [place], AT Eyes: EOMI, no scleral icterus Respiratory: Clear to auscultation bilaterally.  No crackles, wheezing or rales Cardiovascular: RRR, -M/R/G, no JVD Extremities:-Edema,-tenderness Neuro: AAO x4, CNII-XII grossly intact Psych: Normal mood, normal affect  Data Reviewed:  Imaging: CXR 10/15/20 - Normal. No adenopathy. No infiltrate effusion or edema PET/CT 03/10/21 - Normal cardiac perfusion and function. Hypermetabolic activity in mediastinal adenopathy and bilateral lower lobe opacities. No pleural effusions. PET/CT 12/03/21 - Low-level avidity, nonspecific. No hypermetabolic activity in adenopathy. Interval visualization of mild pulmonary fibrosis with apical to basal gradient with associated ground glass and some traction bronchiectasis. No honeycombing. CT Chest 06/17/22 - Mild worsened fibrosis. Subpleural GGO and reticulation bilaterally with moderate traction bronchiectasis  PFT: 01/21/14 FVC 2.65 (84%) FEV1 2.36 (95%) Ratio 86  TLC 92% DLCO 82% Interpretation: Normal spirometry  02/10/21 FVC 1.72 (60%) FEV1 1.55 (70%) Ratio 88 TLC 76% DLCO 54% Interpretation: Mild restrictive lung defect with moderately reduced diffusing capacity. No  significant bronchodilator response. Small air disease present.   Labs: CBC    Component Value Date/Time   WBC 9.2 09/25/2021 0832   WBC 7.6 03/18/2021 1447   RBC 4.40 09/25/2021 0832   RBC 3.82 (L) 03/18/2021 1447   HGB 13.3 09/25/2021 0832   HCT 40.2 09/25/2021 0832   HCT 37 08/05/2017 0746   PLT 262 09/25/2021 0832   MCV 91 09/25/2021 0832   MCV 88.6 08/05/2017 0746   MCH 30.2 09/25/2021 0832   MCH 29.0 09/07/2019 0852   MCHC 33.1 09/25/2021 0832   MCHC 32.9 03/18/2021 1447   RDW 11.8 09/25/2021 0832   RDW 12.6 08/05/2017 0746   LYMPHSABS 3.3 (H) 09/25/2021 0832   MONOABS 0.4 03/18/2021 1447   EOSABS 0.1 09/25/2021 0832   EOSABS 80 08/05/2017 0746   BASOSABS 0.0 09/25/2021 0832      Latest Ref Rng & Units 09/25/2021    8:32 AM 06/16/2021    4:14 PM 03/18/2021    2:47 PM  CMP  Glucose 70 - 99 mg/dL 84  433  84   BUN 8 - 27 mg/dL 15  17  16    Creatinine 0.57 -  1.00 mg/dL 1.61  0.96  0.45   Sodium 134 - 144 mmol/L 140  136  136   Potassium 3.5 - 5.2 mmol/L 4.5  5.1  4.7   Chloride 96 - 106 mmol/L 101  100  102   CO2 20 - 29 mmol/L 26  30  31    Calcium 8.7 - 10.3 mg/dL 9.7  9.8  40.9   Total Protein 6.0 - 8.5 g/dL 6.9   8.5   Total Bilirubin 0.0 - 1.2 mg/dL 0.3   0.4   Alkaline Phos 44 - 121 IU/L 72   58   AST 0 - 40 IU/L 15   17   ALT 0 - 32 IU/L 13   12    EKG: 01/02/21 NSR  Assessment & Plan:   Discussion: 62 year old female with sarcoidosis, GERD, hx H. Pylori who presents for follow-up for sarcoid. Concerned for sarcoid flare with chronic cough since 03/2021. Cough improved on ICS/LABA however CT imaging reviewed and suggestive of worsening fibrosis. We discussed immunosuppression vs waiting on PFTs to demonstrate worsening lung function. After reviewing risks and benefits of treatment, patient wished to pursue immunosuppression and wanting to limit time on prednisone.  Reviewed risks and benefits of methotrexate therapy. Possible adverse effects including but  not limited to increased risk of infection, liver toxicity, bone marrow suppression, pneumonitis and oral ulcers.  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 Hx of side effects (agitation, sleep, weight gain) - Annual PFTs. Last PFTs 02/2021 - Recommend annual ophthalmology exam.  Last visit unknown - EKG  reviewed. No evidence of conduction abnormalities.  - PET/CT 03/12/21 with active pulmonary sarcoid - PET/CT 12/03/21 - low-level avidity. Improved flare. Interval mild fibrosis seen L>R - CT Chest 06/17/22 - mildly worsening fibrosis - Reschedule PFTs  High risk medical management Prednisone 10 mg 03/18/21-06/16/21 Prednisone 20 mg 06/16/21- 09/21/21 Prednisone 10 mg 09/21/21- 01/21/22 --Currently off steroids --Order lab:CBC w diff, CMP, Hep B core antibody IgM, Hep B surface antigen, Hep C antibody, TB gold --Will message pharmacy team for initiation of methotrexate with goal 20 mg weekly when labs returned  Chronic cough 2/2 sarcoid +/- fibrosis Etiology of fibrosis could be from post-viral/inflammatory process, prior sarcoid flare --CONTINUE Advair 115-21 mcg TWO puffs in the morning and evening. Rinse mouth out after use to prevent thrush --CONTINUE Albuterol AS NEEDED    Health Maintenance Immunization History  Administered Date(s) Administered   Influenza Inj Mdck Quad Pf 11/22/2020   Influenza Split 12/04/2019   Influenza, Seasonal, Injecte, Preservative Fre 02/20/2016   Influenza,inj,Quad PF,6+ Mos 12/08/2016, 11/08/2018   Influenza,inj,quad, With Preservative 11/28/2017   Influenza-Unspecified 01/16/2014, 12/03/2014, 12/03/2019, 11/22/2020   Pneumococcal Polysaccharide-23 02/27/2013   Tdap 04/15/2021, 06/01/2021   CT Lung Screen - never smoker  Orders Placed This Encounter  Procedures   CBC With Differential   Comprehensive metabolic panel   Hepatitis B core antibody, IgM   Hepatitis B  surface antigen   Hepatitis C antibody   QuantiFERON-TB Gold Plus   No orders of the defined types were placed in this encounter.  Return in about 10 weeks (around 09/20/2022) for with Dr. Everardo All.   I have spent a total time of 35-minutes on the day of the appointment including chart review, data review, collecting history, coordinating care and discussing medical diagnosis and plan with the patient/family. Past medical history, allergies, medications were reviewed. Pertinent imaging, labs and  tests included in this note have been reviewed and interpreted independently by me.  Quorra Rosene Mechele Collin, MD  Pulmonary [facility] 07/12/2022 1:44 PM  Office Number [phone]

## 2022-07-15 LAB — COMPREHENSIVE METABOLIC PANEL
ALT: 12 IU/L (ref 0–32)
AST: 14 IU/L (ref 0–40)
Albumin/Globulin Ratio: 1.3
Albumin: 4 g/dL (ref 3.9–4.9)
Alkaline Phosphatase: 77 IU/L (ref 44–121)
BUN/Creatinine Ratio: 20 (ref 12–28)
BUN: 16 mg/dL (ref 8–27)
Bilirubin Total: 0.3 mg/dL (ref 0.0–1.2)
CO2: 26 mmol/L (ref 20–29)
Calcium: 9.8 mg/dL (ref 8.7–10.3)
Chloride: 104 mmol/L (ref 96–106)
Creatinine, Ser: 0.82 mg/dL (ref 0.57–1.00)
Globulin, Total: 3 g/dL (ref 1.5–4.5)
Glucose: 84 mg/dL (ref 70–99)
Potassium: 5.1 mmol/L (ref 3.5–5.2)
Sodium: 141 mmol/L (ref 134–144)
Total Protein: 7 g/dL (ref 6.0–8.5)
eGFR: 81 mL/min/{1.73_m2} (ref >59)

## 2022-07-15 LAB — HEPATITIS C ANTIBODY: Hep C Virus Ab: NONREACTIVE

## 2022-07-15 LAB — QUANTIFERON-TB GOLD PLUS
QuantiFERON Mitogen Value: >10 IU/mL
QuantiFERON Nil Value: 0.01 IU/mL
QuantiFERON TB1 Ag Value: 0.01 IU/mL
QuantiFERON TB2 Ag Value: 0.01 IU/mL
QuantiFERON-TB Gold Plus: NEGATIVE

## 2022-07-15 LAB — CBC WITH DIFFERENTIAL
Basophils Absolute: 0 10*3/uL (ref 0.0–0.2)
Basos: 0 %
EOS (ABSOLUTE): 0.1 10*3/uL (ref 0.0–0.4)
Eos: 1 %
Hematocrit: 39 % (ref 34.0–46.6)
Hemoglobin: 12.7 g/dL (ref 11.1–15.9)
Immature Grans (Abs): 0 10*3/uL (ref 0.0–0.1)
Immature Granulocytes: 0 %
Lymphocytes Absolute: 2.7 10*3/uL (ref 0.7–3.1)
Lymphs: 26 %
MCH: 29.5 pg (ref 26.6–33.0)
MCHC: 32.6 g/dL (ref 31.5–35.7)
MCV: 91 fL (ref 79–97)
Monocytes Absolute: 0.7 10*3/uL (ref 0.1–0.9)
Monocytes: 6 %
Neutrophils Absolute: 7.1 10*3/uL — ABNORMAL HIGH (ref 1.4–7.0)
Neutrophils: 67 %
RBC: 4.3 x10E6/uL (ref 3.77–5.28)
RDW: 13.1 % (ref 11.7–15.4)
WBC: 10.6 10*3/uL (ref 3.4–10.8)

## 2022-07-15 LAB — HEPATITIS B SURFACE ANTIGEN: Hepatitis B Surface Ag: NEGATIVE

## 2022-07-15 LAB — HEPATITIS B CORE ANTIBODY, IGM: Hep B C IgM: NEGATIVE

## 2022-07-19 ENCOUNTER — Telehealth: Payer: Self-pay

## 2022-07-19 DIAGNOSIS — Z5181 Encounter for therapeutic drug level monitoring: Secondary | ICD-10-CM

## 2022-07-19 DIAGNOSIS — Z79899 Other long term (current) drug therapy: Secondary | ICD-10-CM

## 2022-07-19 DIAGNOSIS — D869 Sarcoidosis, unspecified: Secondary | ICD-10-CM

## 2022-07-19 NOTE — Telephone Encounter (Signed)
Patient is returning phone call. Patient phone number is [phone].

## 2022-07-19 NOTE — Telephone Encounter (Signed)
ATC patient about methotrexate new start, unable to reach, left VM for call back

## 2022-07-19 NOTE — Telephone Encounter (Signed)
-----   Message from [facility], RPH-CPP sent at 07/15/2022 11:47 AM EDT -----  ----- Message ----- From: Luciano Cutter, MD Sent: 07/15/2022   8:14 AM EDT To: Rx Rheum/Pulm  Please start patient on methotrexate with titration goal to 20 mg weekly. Not on steroids. Labs reviewed and overall wnl.

## 2022-07-20 ENCOUNTER — Encounter (HOSPITAL_BASED_OUTPATIENT_CLINIC_OR_DEPARTMENT_OTHER): Payer: Self-pay | Admitting: Pulmonary Disease

## 2022-07-21 MED ORDER — METHOTREXATE SODIUM 2.5 MG PO TABS: ORAL_TABLET | ORAL | 0 refills | Status: DC

## 2022-07-21 MED ORDER — FOLIC ACID 1 MG PO TABS: 2.0000 mg | ORAL_TABLET | Freq: Every day | ORAL | 3 refills | Status: DC

## 2022-07-21 NOTE — Telephone Encounter (Signed)
ATC patient about methotrexate new start, unable to reach, left VM for call back 

## 2022-07-21 NOTE — Telephone Encounter (Signed)
Pharmacy Note  Subjective: Patient called today by [facility] pharmacy team for counseling on methotrexate for sarcoidosis. Prior therapy includes: Advair HFA 2 puffs twice daily and albuterol PRN.   She is not currently on steroids  Objective: CBC    Component Value Date/Time   WBC 10.6 07/12/2022 1419   WBC 7.6 03/18/2021 1447   RBC 4.30 07/12/2022 1419   RBC 3.82 (L) 03/18/2021 1447   HGB 12.7 07/12/2022 1419   HCT 39.0 07/12/2022 1419   HCT 37 08/05/2017 0746   PLT 262 09/25/2021 0832   MCV 91 07/12/2022 1419   MCV 88.6 08/05/2017 0746   MCH 29.5 07/12/2022 1419   MCH 29.0 09/07/2019 0852   MCHC 32.6 07/12/2022 1419   MCHC 32.9 03/18/2021 1447   RDW 13.1 07/12/2022 1419   RDW 12.6 08/05/2017 0746   LYMPHSABS 2.7 07/12/2022 1419   MONOABS 0.4 03/18/2021 1447   EOSABS 0.1 07/12/2022 1419   EOSABS 80 08/05/2017 0746   BASOSABS 0.0 07/12/2022 1419    CMP     Component Value Date/Time   NA 141 07/12/2022 1419   NA 138 08/05/2017 0746   K 5.1 07/12/2022 1419   K 4.5 08/05/2017 0746   CL 104 07/12/2022 1419   CL 104 08/05/2017 0746   CO2 26 07/12/2022 1419   CO2 29 08/05/2017 0746   GLUCOSE 84 07/12/2022 1419   GLUCOSE 100 (H) 06/16/2021 1614   BUN 16 07/12/2022 1419   CREATININE 0.82 07/12/2022 1419   CREATININE 0.80 09/07/2019 0855   CALCIUM 9.8 07/12/2022 1419   CALCIUM 9.5 08/05/2017 0746   PROT 7.0 07/12/2022 1419   PROT 7.0 08/05/2017 0746   ALBUMIN 4.0 07/12/2022 1419   AST 14 07/12/2022 1419   AST 13 08/05/2017 0746   ALT 12 07/12/2022 1419   ALT 12 08/05/2017 0746   ALKPHOS 77 07/12/2022 1419   ALKPHOS 75 08/05/2017 0746   BILITOT 0.3 07/12/2022 1419   BILITOT 0.4 08/05/2017 0746   GFRNONAA 81 09/07/2019 0855   GFRAA 94 09/07/2019 0855    Baseline Immunosuppressant Therapy Labs TB GOLD    Latest Ref Rng & Units 07/12/2022    2:19 PM  Quantiferon TB Gold  Quantiferon TB Gold Plus Negative Negative    Hepatitis Panel    Latest  Ref Rng & Units 07/12/2022    2:19 PM  Hepatitis  Hep B Surface Ag Negative Negative   Hep B IgM Negative Negative    HIV Lab Results  Component Value Date   HIV Non Reactive 09/25/2021   Immunoglobulins    Latest Ref Rng & Units 09/07/2019    8:55 AM  Immunoglobulin Electrophoresis  IgA  47 - 310 mg/dL 865    SPEP    Latest Ref Rng & Units 07/12/2022    2:19 PM  Serum Protein Electrophoresis  Total Protein 6.0 - 8.5 g/dL 7.0    H8IO No results found for: "G6PDH" TPMT No results found for: "TPMT"   Chest CT:  06/17/22 IMPRESSION: 1. 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 The ground-glass predominance is more suggestive of fibrotic NSIP, although UIP is on the differential. The distribution and appearance are not typical of interstitial lung disease due to sarcoidosis. Findings are indeterminate for UIP per consensus guidelines: Diagnosis of Idiopathic Pulmonary Fibrosis: An Official ATS/ERS/JRS/ALAT Clinical Practice Guideline. Am Rosezetta Schlatter Crit Care Med Vol  198, Iss 5, [id number], Oct 02 2016. 2. One vessel coronary atherosclerosis.  Contraception: Postmenopausal  Alcohol use: no current alcohol use  Assessment/Plan:   Patient was counseled on the purpose, proper use, and adverse effects of methotrexate including nausea, infection, and signs and symptoms of pneumonitis. Reviewed use of MTX as steroid-sparing agent. Discussed that there is the possibility of an increased risk of malignancy, specifically lymphomas, but it is not well understood if this increased risk is due to the medication or the disease state. Instructed patient that medication should be held for infection and prior to surgery.   Reviewed instructions with patient to take methotrexate weekly along with folic acid daily.  Discussed the importance of frequent  monitoring of kidney and liver function and blood counts, and provided patient with standing lab instructions.  Counseled patient to avoid NSAIDs and alcohol while on methotrexate. Encouraged and answered all questions.   Patient voiced understanding.  Patient verbally consented to methotrexate use.    Week Methotrexate Dose (weekly) Folic acid dose (daily) Labs due (CBC/CMP)  1 & 2 7.5 mg (3 tablets) 1 mg  End of Week 2  3 & 4 10 mg (4 tablets) 1 mg End of Week 4  5 & 6 12.5 mg (5 tablets) 1 mg End of Week 6  7 & 8 15 mg (6 tablets) 2 mg End of Week 8  9 & 10 17.5 mg (7 tablets) 2 mg End of Week 10  11 & 12 and onwards 20 mg (8 tablets) 2 mg End of Week 12 and then every 3 months   Chesley Mires, PharmD, MPH, BCPS, CPP Clinical Pharmacist (Rheumatology and Pulmonology)

## 2022-07-23 ENCOUNTER — Telehealth: Payer: Self-pay | Admitting: Pulmonary Disease

## 2022-07-23 NOTE — Telephone Encounter (Signed)
Discussed risks and benefits of prednisone vs methotrexate in detail. We discussed being off of treatment as well and continued surveillance.  After consideration, patient will take methotrexate once available. She will let us know if there is any issues with insurance or pharmacy getting the meds to her.

## 2022-07-23 NOTE — Telephone Encounter (Signed)
Patient would like to discuss side effects for Methotrexate. Has not started medication yet. Patient phone number is [phone].

## 2022-08-10 ENCOUNTER — Telehealth: Payer: Self-pay | Admitting: Pulmonary Disease

## 2022-08-10 NOTE — Telephone Encounter (Signed)
Pt was Rx'd methotrexate and should have labs but she was not sure when she was supposed to go in for these. I see the order(s) in active requests. Please call to advise.  [phone]  She lives in [place], [place] and we were going to have [facility] do it nearer to her.    She thinks it is every week so I am putting this back High Priority.  Also states her back/kidneys are hurting and she has been nauseous as well. Could this be a side effect of this med?

## 2022-08-11 ENCOUNTER — Telehealth (HOSPITAL_BASED_OUTPATIENT_CLINIC_OR_DEPARTMENT_OTHER): Payer: Self-pay | Admitting: Pulmonary Disease

## 2022-08-11 NOTE — Telephone Encounter (Signed)
Patient wanted to know if there were any restrictions prior to having blood work done. Please advise.

## 2022-08-11 NOTE — Telephone Encounter (Signed)
I contacted patient by phone. No restrictions for bloodwork.  She reports some back pain associated with left side. No changes in urinary habits. Otherwise tolerating methotrexate fairly. Still having some fatigue. Will await labs to evaluate for any kidney dysfunction.  She is planning to get labs completed at [facility] in [place], [place]. Advised patient to have results sent to our office. Fax [phone] provided.

## 2022-08-11 NOTE — Telephone Encounter (Signed)
Terri Newman, please schedule patient for video visit at her convenience, any blocked spot ok.   Please also review the chart in mychart that I sent for labs: "Labs are expected every 2 weeks. The medication schedule and lab schedule is available in mychart sent by Dr. Everardo All."

## 2022-08-12 ENCOUNTER — Other Ambulatory Visit: Payer: Self-pay

## 2022-08-13 LAB — CBC WITH DIFFERENTIAL/PLATELET
Basophils Absolute: 0 10*3/uL (ref 0.0–0.2)
Basos: 0 %
EOS (ABSOLUTE): 0.1 10*3/uL (ref 0.0–0.4)
Eos: 2 %
Hematocrit: 35.9 % (ref 34.0–46.6)
Hemoglobin: 12 g/dL (ref 11.1–15.9)
Immature Grans (Abs): 0 10*3/uL (ref 0.0–0.1)
Immature Granulocytes: 0 %
Lymphocytes Absolute: 1.9 10*3/uL (ref 0.7–3.1)
Lymphs: 26 %
MCH: 30.1 pg (ref 26.6–33.0)
MCHC: 33.4 g/dL (ref 31.5–35.7)
MCV: 90 fL (ref 79–97)
Monocytes Absolute: 0.4 10*3/uL (ref 0.1–0.9)
Monocytes: 6 %
Neutrophils Absolute: 4.9 10*3/uL (ref 1.4–7.0)
Neutrophils: 66 %
Platelets: 223 10*3/uL (ref 150–450)
RBC: 3.99 x10E6/uL (ref 3.77–5.28)
RDW: 13.1 % (ref 11.7–15.4)
WBC: 7.4 10*3/uL (ref 3.4–10.8)

## 2022-08-13 LAB — COMPREHENSIVE METABOLIC PANEL
ALT: 14 IU/L (ref 0–32)
AST: 15 IU/L (ref 0–40)
Albumin: 4 g/dL (ref 3.9–4.9)
Alkaline Phosphatase: 80 IU/L (ref 44–121)
BUN/Creatinine Ratio: 25 (ref 12–28)
BUN: 18 mg/dL (ref 8–27)
Bilirubin Total: 0.2 mg/dL (ref 0.0–1.2)
CO2: 23 mmol/L (ref 20–29)
Calcium: 9.6 mg/dL (ref 8.7–10.3)
Chloride: 102 mmol/L (ref 96–106)
Creatinine, Ser: 0.73 mg/dL (ref 0.57–1.00)
Globulin, Total: 3.5 g/dL (ref 1.5–4.5)
Glucose: 86 mg/dL (ref 70–99)
Potassium: 4.6 mmol/L (ref 3.5–5.2)
Sodium: 138 mmol/L (ref 134–144)
Total Protein: 7.5 g/dL (ref 6.0–8.5)
eGFR: 94 mL/min/{1.73_m2} (ref >59)

## 2022-08-17 ENCOUNTER — Telehealth (INDEPENDENT_AMBULATORY_CARE_PROVIDER_SITE_OTHER): Admitting: Pulmonary Disease

## 2022-08-17 DIAGNOSIS — D869 Sarcoidosis, unspecified: Secondary | ICD-10-CM | POA: Diagnosis not present

## 2022-08-17 NOTE — Progress Notes (Signed)
Virtual Visit via Video Note  I connected with Terri Newman on 08/18/22 at  4:00 PM EDT by a video enabled telemedicine application and verified that I am speaking with the correct person using two identifiers.  Location: Patient: Home Provider: [facility] office   I discussed the limitations of evaluation and management by telemedicine and the availability of in person appointments. The patient expressed understanding and agreed to proceed.   Subjective:   PATIENT ID: Terri Newman GENDER: female DOB: 1960/09/08, MRN: [id number]   HPI  Chief Complaint  Patient presents with   Follow-up    Occ cough- prod with clear sputum. Breathing is doing well.      Reason for Visit: Follow-up sarcoid  Terri Newman is a 62 year old female with sarcoidosis, GERD, hx H. Pylori who presents for follow-up.  01/09/21 She was previously seen by Dr. Sherene Sires since 01/2020 for sarcoid management. Was followed by Juanetta Gosling before this. She was diagnosed with sarcoidosis in 2013 via mediastinoscopy at [facility]. She was treated with prednisone for 8 months with no recurrence off steroids. Cough was her primary symptoms with sarcoid flare. She reports fatigue but still able to work (lift boxes, walking frequently)  2022 - chronic nonproductive cough for last five months. GI work-up was negative including neg H.pylori. Recent cardiac work-up also neg. Cough does not interfere with her activities. She reports fatigue but attributes this to sleeping five hours a night. 2023 - Prednisone 10 mg x 3 months. Coughing has improved but not resolved.Increased to prednisone 20 mg. Has tolerated well except for weight gain (12lb). Weaned back to prednisone 10 mg daily. Tolerating well. Has some shortness of breath that requires albuterol once a month. Weaned off steroids in December 2024 - Started on Advair with improvement in cough. Fatigue has worsened  07/12/22 Since our last visit she has been  started on Advair. She reports cough occurs every once a while compared to daily before. Not needing rescue inhaler. No shortness of breath or wheezing. Some fatigue that is unchanged.  08/17/22 Since our last visit she reports right sided back pain that will intermittently occur. She had [facility] at [facility] on [facility]. Not faxed yet or available in EMR. Had some mild nausea and mouth sores that self resolved. 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 pylori infection 09/2019   Treated with bismuth subsalicylate, tetracycline, metronidazole, and PPI twice daily x14 days.  Documented eradication November 2022 via breath test.   Helicobacter pylori gastritis 08/2013   ABO  BID FOR 10 DAYS   Neuromuscular disorder (HCC)    chronic back pain   PONV (postoperative nausea and vomiting) 11/07/2012   Sarcoidosis    Sarcoidosis      Family History  Problem Relation Age of Onset   Heart attack Mother    Alcohol abuse Mother    Heart attack Father    Alcohol abuse Father    Colon cancer Neg Hx      Social History   Occupational History   Occupation: International aid/development worker    Comment: makes ranch dressing, pasta sauce, salsa, and other food  produces   Occupation: Insurance account manager  Tobacco Use   Smoking status: Never    Passive exposure: Never   Smokeless tobacco: Never  Vaping Use   Vaping status: Never Used  Substance and Sexual Activity   Alcohol use: No   Drug use: No   Sexual activity: Not Currently    No Known Allergies   Outpatient Medications Prior to Visit  Medication Sig Dispense Refill   albuterol (VENTOLIN HFA) 108 (90  Base) MCG/ACT inhaler Inhale 2 puffs into the lungs every 6 (six) hours as needed for wheezing or shortness of breath. 8 g 2   fluticasone-salmeterol (ADVAIR HFA) 115-21 MCG/ACT inhaler Inhale 2 puffs into the lungs 2 (two) times daily. 1 each 12   folic acid (FOLVITE) 1 MG tablet Take 2 tablets (2 mg total) by mouth daily. 180 tablet 3   methotrexate (RHEUMATREX) 2.5 MG tablet Take 3 tabs once weekly x 2 weeks, then 4 tabs once weekly x 2 wks, then 5 tabs weekly x 2 wks, then 6 tabs weekly x 2 wks, then 7 tabs weekly x 2 wks, then 8 tabs weekly thereater 82 tablet 0   pantoprazole (PROTONIX) 40 MG tablet Take 1 tablet (40 mg total) by mouth 2 (two) times daily. TAKE 1 TABLET(40 MG) BY MOUTH TWICE DAILY 60 tablet 11   Bismuth/Metronidaz/Tetracyclin (PYLERA) 140-125-125 MG CAPS Take 3 capsules by mouth in the morning, at noon, in the evening, and at bedtime for 10 days. 120 capsule 0   cyclobenzaprine (FLEXERIL) 5 MG tablet Take 1 tablet (5 mg total) by mouth 3 (three) times daily as needed for muscle spasms. 30 tablet 1   Vitamin D, Ergocalciferol, (DRISDOL) 1.25 MG (50000 UNIT) CAPS capsule Take 1 capsule (50,000 Units total) by mouth every 7 (seven) days. 7 capsule 2   No facility-administered medications prior to visit.    Review of Systems  Constitutional:  Positive for malaise/fatigue. Negative for chills, diaphoresis, fever and weight loss.  HENT:  Negative for congestion.   Respiratory:  Negative for cough, hemoptysis, sputum production, shortness of breath and wheezing.   Cardiovascular:  Negative for chest pain, palpitations and leg swelling.     Objective:   There were no vitals filed for this visit.     Physical Exam: General: Well-appearing, no acute distress HENT: [place], AT Eyes: EOMI, no scleral icterus Neuro: AAO x4, CNII-XII grossly intact Psych: Normal mood, normal affect   Data Reviewed:  Imaging: CXR 10/15/20 - Normal. No adenopathy. No infiltrate effusion or  edema PET/CT 03/10/21 - Normal cardiac perfusion and function. Hypermetabolic activity in mediastinal adenopathy and bilateral lower lobe opacities. No pleural effusions. PET/CT 12/03/21 - Low-level avidity, nonspecific. No hypermetabolic activity in adenopathy. Interval visualization of mild pulmonary fibrosis with apical to basal gradient with associated ground glass and some traction bronchiectasis. No honeycombing. CT Chest 06/17/22 - Mild worsened fibrosis. Subpleural GGO and reticulation bilaterally with moderate traction bronchiectasis  PFT: 01/21/14 FVC 2.65 (84%) FEV1 2.36 (95%) Ratio 86  TLC 92% DLCO 82% Interpretation: Normal spirometry  02/10/21 FVC 1.72 (60%) FEV1 1.55 (70%) Ratio 88 TLC 76% DLCO 54% Interpretation: Mild restrictive lung defect with moderately reduced diffusing capacity. No significant bronchodilator response. Small air disease present.   Labs: CBC    Component Value Date/Time   WBC 10.6 07/12/2022 1419   WBC 7.6 03/18/2021 1447   RBC 4.30 07/12/2022 1419   RBC 3.82 (L) 03/18/2021 1447   HGB 12.7 07/12/2022 1419   HCT 39.0  07/12/2022 1419   HCT 37 08/05/2017 0746   PLT 262 09/25/2021 0832   MCV 91 07/12/2022 1419   MCV 88.6 08/05/2017 0746   MCH 29.5 07/12/2022 1419   MCH 29.0 09/07/2019 0852   MCHC 32.6 07/12/2022 1419   MCHC 32.9 03/18/2021 1447   RDW 13.1 07/12/2022 1419   RDW 12.6 08/05/2017 0746   LYMPHSABS 2.7 07/12/2022 1419   MONOABS 0.4 03/18/2021 1447   EOSABS 0.1 07/12/2022 1419   EOSABS 80 08/05/2017 0746   BASOSABS 0.0 07/12/2022 1419      Latest Ref Rng & Units 07/12/2022    2:19 PM 09/25/2021    8:32 AM 06/16/2021    4:14 PM  CMP  Glucose 70 - 99 mg/dL 84  84  782   BUN 8 - 27 mg/dL 16  15  17    Creatinine 0.57 - 1.00 mg/dL 9.56  2.13  0.86   Sodium 134 - 144 mmol/L 141  140  136   Potassium 3.5 - 5.2 mmol/L 5.1  4.5  5.1   Chloride 96 - 106 mmol/L 104  101  100   CO2 20 - 29 mmol/L 26  26  30    Calcium 8.7 - 10.3 mg/dL 9.8   9.7  9.8   Total Protein 6.0 - 8.5 g/dL 7.0  6.9    Total Bilirubin 0.0 - 1.2 mg/dL 0.3  0.3    Alkaline Phos 44 - 121 IU/L 77  72    AST 0 - 40 IU/L 14  15    ALT 0 - 32 IU/L 12  13     EKG: 01/02/21 NSR  Assessment & Plan:   Discussion:  62 year old female with sarcoidosis, GERD, hx H.pylori who presents for follow-up for sarcoid flare and CT suggestive of worsening fibrosis. Currently on methotrexate with flank pain, no changes in urinary symptoms. Since we are early in immunosuppression course, reasonable to hold to see if symptoms resolve and consider slower taper.  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 Hx of side effects (agitation, sleep, weight gain) - Annual PFTs. Last PFTs 02/2021 - Recommend annual ophthalmology exam.  Last visit unknown - EKG  reviewed. No evidence of conduction abnormalities.  - PET/CT 03/12/21 with active pulmonary sarcoid - PET/CT 12/03/21 - low-level avidity. Improved flare. Interval mild fibrosis seen L>R - CT Chest 06/17/22 - mildly worsening fibrosis - Reschedule PFTs  High risk medical management Prednisone 10 mg 03/18/21-06/16/21 Prednisone 20 mg 06/16/21- 09/21/21 Prednisone 10 mg 09/21/21- 01/21/22 --Currently off steroids --Currently on week 3 of methotrexate. 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 Rinse mouth out after use to prevent thrush --CONTINUE Albuterol AS NEEDED    Health Maintenance Immunization History  Administered Date(s) Administered   Influenza Inj Mdck Quad Pf 11/22/2020   Influenza Split 12/04/2019   Influenza, Seasonal, Injecte, Preservative Fre 02/20/2016   Influenza,inj,Quad  PF,6+ Mos 12/08/2016, 11/08/2018   Influenza,inj,quad, With Preservative 11/28/2017   Influenza-Unspecified 01/16/2014, 12/03/2014, 12/03/2019, 11/22/2020   Pneumococcal Polysaccharide-23 02/27/2013   Tdap 04/15/2021, 06/01/2021   CT Lung Screen - never smoker  No orders of the defined types were placed in this encounter.  No orders of  the defined types were placed in this encounter.  Return in about 1 month (around 09/17/2022).   I have spent a total time of 20-minutes on the day of the appointment including chart review, data review, collecting history, coordinating care and discussing medical diagnosis and plan with the patient/family. Past medical history, allergies, medications were reviewed. Pertinent imaging, labs and tests included in this note have been reviewed and interpreted independently by me.  Mandell Pangborn Mechele Collin, MD  Pulmonary Critical Care 08/17/2022 4:05 PM  Office Number [phone]

## 2022-08-17 NOTE — Patient Instructions (Signed)
--  Currently on week 3 of methotrexate. STOP methotrexate until labs have resulted  >If normal will restart taper  >If abnormal consider slower taper

## 2022-08-18 ENCOUNTER — Encounter (HOSPITAL_BASED_OUTPATIENT_CLINIC_OR_DEPARTMENT_OTHER): Payer: Self-pay | Admitting: Pulmonary Disease

## 2022-09-08 ENCOUNTER — Telehealth (HOSPITAL_BASED_OUTPATIENT_CLINIC_OR_DEPARTMENT_OTHER): Admitting: Pulmonary Disease

## 2022-09-09 ENCOUNTER — Encounter (HOSPITAL_BASED_OUTPATIENT_CLINIC_OR_DEPARTMENT_OTHER): Payer: Self-pay | Admitting: Pulmonary Disease

## 2022-09-09 ENCOUNTER — Telehealth (INDEPENDENT_AMBULATORY_CARE_PROVIDER_SITE_OTHER): Admitting: Pulmonary Disease

## 2022-09-09 DIAGNOSIS — D869 Sarcoidosis, unspecified: Secondary | ICD-10-CM | POA: Diagnosis not present

## 2022-09-09 NOTE — Progress Notes (Signed)
Virtual Visit via Video Note  I connected with Terri Newman on 09/09/22 at  4:00 PM EDT by a video enabled telemedicine application and verified that I am speaking with the correct person using two identifiers.  Location: Patient: Home Provider: [facility] office   I discussed the limitations of evaluation and management by telemedicine and the availability of in person appointments. The patient expressed understanding and agreed to proceed.   Subjective:   PATIENT ID: Terri Newman GENDER: female DOB: 1960/06/12, MRN: [id number]   HPI  Chief Complaint  Patient presents with   Follow-up    Reason for Visit: Follow-up sarcoid  Terri Newman is a 62 year old female with sarcoidosis, GERD, hx H. Pylori who presents for follow-up.  01/09/21 She was previously seen by Dr. Sherene Sires since 01/2020 for sarcoid management. Was followed by Juanetta Gosling before this. She was diagnosed with sarcoidosis in 2013 via mediastinoscopy at [facility]. She was treated with prednisone for 8 months with no recurrence off steroids. Cough was her primary symptoms with sarcoid flare. She reports fatigue but still able to work (lift boxes, walking frequently)  2022 - chronic nonproductive cough for last five months. GI work-up was negative including neg H.pylori. Recent cardiac work-up also neg. Cough does not interfere with her activities. She reports fatigue but attributes this to sleeping five hours a night. 2023 - Prednisone 10 mg x 3 months. Coughing has improved but not resolved.Increased to prednisone 20 mg. Has tolerated well except for weight gain (12lb). Weaned back to prednisone 10 mg daily. Tolerating well. Has some shortness of breath that requires albuterol once a month. Weaned off steroids in December 2024 - Started on Advair with improvement in cough. Fatigue has worsened  07/12/22 Since our last visit she has been started on Advair. She reports cough occurs every once a while  compared to daily before. Not needing rescue inhaler. No shortness of breath or wheezing. Some fatigue that is unchanged.  08/17/22 Since our last visit she reports right sided back pain that will intermittently occur. [facility] had [facility] at [facility] on [facility]. Not faxed yet or available in EMR. Had some mild nausea and mouth sores that self resolved. Her level of fatigue is unchanged on or off methotrexate  09/09/22 Since our last visit, back pain has resolved. Has been off methorexate for the last four weeks. 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 pylori infection 09/2019   Treated with bismuth subsalicylate, tetracycline, metronidazole, and PPI twice daily x14 days.  Documented eradication November 2022 via breath test.   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 8 g 2   fluticasone-salmeterol (ADVAIR HFA) 115-21 MCG/ACT inhaler Inhale 2 puffs into the lungs 2 (two) times daily. 1 each 12   folic acid (FOLVITE) 1 MG tablet Take 2 tablets (2 mg total) by mouth daily. 180 tablet 3   methotrexate (RHEUMATREX) 2.5 MG tablet Take 3 tabs once weekly x 2 weeks, then 4 tabs once weekly x 2 wks, then 5 tabs weekly x 2 wks, then 6 tabs weekly x 2 wks, then 7 tabs weekly x 2 wks, then 8 tabs weekly thereater 82 tablet 0   pantoprazole (PROTONIX) 40 MG tablet Take 1 tablet (40 mg total) by mouth 2 (two) times daily. TAKE 1 TABLET(40 MG) BY MOUTH TWICE DAILY 60 tablet 11   No facility-administered medications prior to visit.    Review of Systems  Constitutional:  Positive for malaise/fatigue. Negative for chills, diaphoresis, fever and weight loss.  HENT:  Negative for congestion.   Respiratory:  Negative for cough, hemoptysis, sputum production, shortness of breath and wheezing.   Cardiovascular:  Negative for chest pain, palpitations and leg swelling.     Objective:   There were no vitals filed for this visit.     Physical Exam: General: Well-appearing, no acute distress HENT: [place], AT Eyes: EOMI, no scleral icterus Respiratory: No respiratory distress Neuro: AAO x4, CNII-XII grossly intact Psych: Normal mood, normal affect  Data Reviewed:  Imaging: CXR 10/15/20 - Normal. No adenopathy. No infiltrate effusion or edema PET/CT 03/10/21 - Normal cardiac perfusion and function. Hypermetabolic activity in mediastinal adenopathy and bilateral lower lobe opacities. No pleural effusions. PET/CT 12/03/21 - Low-level avidity, nonspecific. No hypermetabolic activity in adenopathy. Interval visualization of mild pulmonary fibrosis with apical to basal gradient with associated ground  glass and some traction bronchiectasis. No honeycombing. CT Chest 06/17/22 - Mild worsened fibrosis. Subpleural GGO and reticulation bilaterally with moderate traction bronchiectasis  PFT: 01/21/14 FVC 2.65 (84%) FEV1 2.36 (95%) Ratio 86  TLC 92% DLCO 82% Interpretation: Normal spirometry  02/10/21 FVC 1.72 (60%) FEV1 1.55 (70%) Ratio 88 TLC 76% DLCO 54% Interpretation: Mild restrictive lung defect with moderately reduced diffusing capacity. No significant bronchodilator response. Small air disease present.   Labs:    Latest Ref Rng & Units 08/12/2022    4:26 PM 07/12/2022    2:19 PM 09/25/2021    8:32 AM  CBC  WBC 3.4 - 10.8 x10E3/uL 7.4  10.6  9.2   Hemoglobin 11.1 - 15.9 g/dL 16.1  09.6  04.5   Hematocrit 34.0 - 46.6 % 35.9  39.0  40.2   Platelets 150 - 450 x10E3/uL 223   262         Latest Ref Rng & Units 08/12/2022    4:26 PM 07/12/2022    2:19 PM 09/25/2021    8:32 AM  CMP  Glucose 70 - 99 mg/dL 86  84  84   BUN 8 - 27 mg/dL 18  16  15    Creatinine 0.57 - 1.00 mg/dL 1.61  0.96  0.45   Sodium 134 - 144 mmol/L 138  141  140   Potassium 3.5 - 5.2 mmol/L 4.6  5.1  4.5   Chloride 96 - 106 mmol/L 102  104  101   CO2 20 - 29 mmol/L 23  26  26    Calcium 8.7 - 10.3 mg/dL 9.6  9.8  9.7   Total Protein 6.0 - 8.5 g/dL 7.5  7.0  6.9   Total Bilirubin 0.0 - 1.2 mg/dL 0.2  0.3  0.3   Alkaline Phos 44 - 121 IU/L 80  77  72   AST 0 - 40 IU/L 15  14  15    ALT 0 - 32 IU/L 14  12  13      EKG: 01/02/21 NSR  Assessment & Plan:   Discussion: 62 year old female with sarcoidosis, GERD who presents for follow-up for sarcoid flare. Held methotrexate for two weeks due to abdominal symptoms. Labs reviewed with no abnormalities. 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 Hx of side effects (agitation, sleep, weight gain) - Annual PFTs. Last PFTs 02/2021 - Recommend annual  ophthalmology exam.  Last visit unknown - EKG  reviewed. No evidence of conduction abnormalities.  - PET/CT 03/12/21 with active pulmonary sarcoid - PET/CT 12/03/21 - low-level avidity. Improved flare. 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 Rinse mouth out after use to prevent thrush --CONTINUE Albuterol AS NEEDED    Health Maintenance Immunization History  Administered Date(s) Administered   Influenza Inj Mdck Quad Pf 11/22/2020   Influenza Split 12/04/2019   Influenza, Seasonal, Injecte, Preservative Fre 02/20/2016   Influenza,inj,Quad PF,6+ Mos 12/08/2016, 11/08/2018   Influenza,inj,quad, With Preservative 11/28/2017   Influenza-Unspecified 01/16/2014, 12/03/2014, 12/03/2019, 11/22/2020   Pneumococcal Polysaccharide-23 02/27/2013   Tdap 04/15/2021, 06/01/2021   CT Lung Screen - never smoker  No orders of the defined types were placed in this encounter.  No orders of the defined types were placed in this encounter.  Return for the week of August 27-28-29 for inperson or video visit.   I have spent a total time of 20-minutes on the day of the appointment including chart review, data review, collecting history, coordinating care and discussing medical diagnosis and plan with the patient/family. Past medical history, allergies, medications were reviewed. Pertinent imaging, labs and tests included in this note have been  reviewed and interpreted independently by me.  Alyia Lacerte Mechele Collin, MD [facility] 09/09/2022 9:34 PM  Office Number [phone]

## 2022-09-09 NOTE — Patient Instructions (Addendum)
Sarcoid flare --Restart methotrexate taper from week 1 starting with 7.5 mcg dosing

## 2022-09-17 ENCOUNTER — Ambulatory Visit (HOSPITAL_BASED_OUTPATIENT_CLINIC_OR_DEPARTMENT_OTHER): Admitting: Pulmonary Disease

## 2022-09-28 ENCOUNTER — Telehealth (INDEPENDENT_AMBULATORY_CARE_PROVIDER_SITE_OTHER): Admitting: Pulmonary Disease

## 2022-09-28 ENCOUNTER — Encounter (HOSPITAL_BASED_OUTPATIENT_CLINIC_OR_DEPARTMENT_OTHER): Payer: Self-pay | Admitting: Pulmonary Disease

## 2022-09-28 DIAGNOSIS — J8410 Pulmonary fibrosis, unspecified: Secondary | ICD-10-CM | POA: Diagnosis not present

## 2022-09-28 DIAGNOSIS — D860 Sarcoidosis of lung: Secondary | ICD-10-CM

## 2022-09-28 DIAGNOSIS — R059 Cough, unspecified: Secondary | ICD-10-CM | POA: Diagnosis not present

## 2022-09-28 DIAGNOSIS — D869 Sarcoidosis, unspecified: Secondary | ICD-10-CM

## 2022-09-28 DIAGNOSIS — Z79899 Other long term (current) drug therapy: Secondary | ICD-10-CM

## 2022-09-28 NOTE — Progress Notes (Signed)
Virtual Visit via Video Note  I connected with Terri Newman on 09/28/22 at  4:00 PM EDT by a video enabled telemedicine application and verified that I am speaking with the correct person using two identifiers.  Location: Patient: Home Provider: [facility] office   I discussed the limitations of evaluation and management by telemedicine and the availability of in person appointments. The patient expressed understanding and agreed to proceed.   Subjective:   PATIENT ID: Terri Newman GENDER: female DOB: [birth year], MRN: [id number]   HPI  Chief Complaint  Patient presents with   Follow-up    Sarcoid     Reason for Visit: Follow-up sarcoid  Terri Newman is a 62 year old female with sarcoidosis, GERD, hx H. Pylori who presents for follow-up.  01/09/21 She was previously seen by Dr. Sherene Sires since 01/2020 for sarcoid management. Was followed by Juanetta Gosling before this. She was diagnosed with sarcoidosis in 2013 via mediastinoscopy at [facility]. She was treated with prednisone for 8 months with no recurrence off steroids. Cough was her primary symptoms with sarcoid flare. She reports fatigue but still able to work (lift boxes, walking frequently)  2022 - chronic nonproductive cough for last five months. GI work-up was negative including neg H.pylori. Recent cardiac work-up also neg. Cough does not interfere with her activities. She reports fatigue but attributes this to sleeping five hours a night. 2023 - Prednisone 10 mg x 3 months. Coughing has improved but not resolved.Increased to prednisone 20 mg. Has tolerated well except for weight gain (12lb). Weaned back to prednisone 10 mg daily. Tolerating well. Has some shortness of breath that requires albuterol once a month. Weaned off steroids in December 2024 - Started on Advair with improvement in cough. Fatigue has worsened  07/12/22 Since our last visit she has been started on Advair. She reports cough occurs every once a  while compared to daily before. Not needing rescue inhaler. No shortness of breath or wheezing. Some fatigue that is unchanged.  08/17/22 Since our last visit she reports right sided back pain that will intermittently occur. [facility] at [facility] on [facility]. Not faxed yet or available in EMR. Had some mild nausea and mouth sores that self resolved. Her level of fatigue is unchanged on or off methotrexate  09/09/22 Since our last visit, back pain has resolved. Has been off methorexate for the last four weeks. Continues to have fatigue  09/28/22 Since our last visit she is tolerating methotrexate 10 mg weekly. No oral ulcers. Unchanged fatigue and shortness of breath.  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 pylori infection 09/2019   Treated with bismuth subsalicylate, tetracycline, metronidazole, and PPI twice daily x14 days.  Documented eradication November 2022 via breath test.   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 8 g 2   fluticasone-salmeterol (ADVAIR HFA) 115-21 MCG/ACT inhaler Inhale 2 puffs into the lungs 2 (two) times daily. 1 each 12   folic acid (FOLVITE) 1 MG tablet Take 2 tablets (2 mg total) by mouth daily. 180 tablet 3   methotrexate (RHEUMATREX) 2.5 MG tablet Take 3 tabs once weekly x 2 weeks, then 4 tabs once weekly x 2 wks, then 5 tabs weekly x 2 wks, then 6 tabs weekly x 2 wks, then 7 tabs weekly x 2 wks, then 8 tabs weekly thereater 82 tablet 0   pantoprazole (PROTONIX) 40 MG tablet Take 1 tablet (40 mg total) by mouth 2 (two) times daily. TAKE 1 TABLET(40 MG) BY MOUTH TWICE DAILY 60 tablet 11   No facility-administered medications prior to visit.    Review of Systems  Constitutional:  Positive for malaise/fatigue. Negative for chills, diaphoresis, fever and weight loss.  HENT:  Negative for congestion.   Respiratory:  Positive for shortness of breath. Negative for cough, hemoptysis, sputum production and wheezing.   Cardiovascular:  Negative for chest pain, palpitations and leg swelling.     Objective:   There were no vitals filed for this visit.     Physical Exam: General: Well-appearing, no acute distress HENT: , AT Eyes: EOMI, no scleral icterus Respiratory: No respiratory distress Extremities:-Edema,-tenderness Neuro: AAO x4, CNII-XII grossly intact Psych: Normal mood, normal affect   Data Reviewed:  Imaging: CXR 10/15/20 - Normal. No adenopathy. No infiltrate effusion or edema PET/CT 03/10/21 - Normal cardiac perfusion and function. Hypermetabolic activity in mediastinal adenopathy and bilateral lower lobe opacities. No pleural  effusions. PET/CT 12/03/21 - Low-level avidity, nonspecific. No hypermetabolic activity in adenopathy. Interval visualization of mild pulmonary fibrosis with apical to basal gradient with associated ground glass and some traction bronchiectasis. No honeycombing. CT Chest 06/17/22 - Mild worsened fibrosis. Subpleural GGO and reticulation bilaterally with moderate traction bronchiectasis  PFT: 01/21/14 FVC 2.65 (84%) FEV1 2.36 (95%) Ratio 86  TLC 92% DLCO 82% Interpretation: Normal spirometry  02/10/21 FVC 1.72 (60%) FEV1 1.55 (70%) Ratio 88 TLC 76% DLCO 54% Interpretation: Mild restrictive lung defect with moderately reduced diffusing capacity. No significant bronchodilator response. Small air disease present.   Labs:    Latest Ref Rng & Units 08/12/2022    4:26 PM 07/12/2022    2:19 PM 09/25/2021    8:32 AM  CBC  WBC 3.4 - 10.8 x10E3/uL 7.4  10.6  9.2   Hemoglobin 11.1 - 15.9 g/dL 32.4  40.1  02.7   Hematocrit 34.0 - 46.6 % 35.9  39.0  40.2   Platelets 150 - 450 x10E3/uL 223   262         Latest Ref Rng &  Units 08/12/2022    4:26 PM 07/12/2022    2:19 PM 09/25/2021    8:32 AM  CMP  Glucose 70 - 99 mg/dL 86  84  84   BUN 8 - 27 mg/dL 18  16  15    Creatinine 0.57 - 1.00 mg/dL 1.61  0.96  0.45   Sodium 134 - 144 mmol/L 138  141  140   Potassium 3.5 - 5.2 mmol/L 4.6  5.1  4.5   Chloride 96 - 106 mmol/L 102  104  101   CO2 20 - 29 mmol/L 23  26  26    Calcium 8.7 - 10.3 mg/dL 9.6  9.8  9.7   Total Protein 6.0 - 8.5 g/dL 7.5  7.0  6.9   Total Bilirubin 0.0 - 1.2 mg/dL 0.2  0.3  0.3   Alkaline Phos 44 - 121 IU/L 80  77  72   AST 0 - 40 IU/L 15  14  15    ALT 0 - 32 IU/L 14  12  13      EKG: 01/02/21 NSR  Assessment & Plan:   Discussion: 62 year old female with sarcoidosis, GERD who presents for follow-up for sarcoid flare. Previously held methotrexate due to abdominal symptoms. Tolerating low dose methotrexate. Will plan to titrate up. Labs ordered  Pulmonary sarcoidosis with mild  restrictive defect and reduced DLCO - Dx in 2013 via mediastinoscopy - S/p steroids 2013. Hx of side effects (agitation, sleep, weight gain) - Annual PFTs. Last PFTs 02/2021 - Recommend annual ophthalmology exam.  Last visit unknown - EKG  reviewed. No evidence of conduction abnormalities.  - PET/CT 03/12/21 with active pulmonary sarcoid - PET/CT 12/03/21 - low-level avidity. Improved flare. 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 Rinse mouth out after use to prevent thrush --CONTINUE Albuterol AS NEEDED    Health Maintenance Immunization History  Administered Date(s) Administered   Influenza Inj Mdck Quad Pf 11/22/2020   Influenza Split 12/04/2019   Influenza, Seasonal, Injecte, Preservative Fre 02/20/2016   Influenza,inj,Quad PF,6+ Mos 12/08/2016, 11/08/2018   Influenza,inj,quad, With Preservative 11/28/2017   Influenza-Unspecified 01/16/2014, 12/03/2014, 12/03/2019, 11/22/2020   Pneumococcal Polysaccharide-23 02/27/2013   Tdap 04/15/2021, 06/01/2021   CT Lung Screen - never smoker  Orders Placed This Encounter  Procedures   CBC With Differential    Standing Status:   Future    Standing Expiration Date:   09/28/2023   Comp Met (CMET)    Standing Status:   Future    Standing Expiration Date:   09/28/2023   CBC With Differential    Standing Status:   Future     Standing Expiration Date:   09/28/2023   Comp Met (CMET)    Standing Status:   Future    Standing Expiration Date:   09/28/2023   No orders of the defined types were placed in this encounter.  No follow-ups on file.   I have spent a total time of 10-minutes on the  day of the appointment including chart review, data review, collecting history, coordinating care and discussing medical diagnosis and plan with the patient/family. Past medical history, allergies, medications were reviewed. Pertinent imaging, labs and tests included in this note have been reviewed and interpreted independently by me.  Special Ranes Mechele Collin, MD [facility] 09/28/2022 9:19 PM  Office Number [phone]

## 2022-10-08 ENCOUNTER — Telehealth: Payer: Self-pay | Admitting: Pulmonary Disease

## 2022-10-08 ENCOUNTER — Other Ambulatory Visit: Payer: Self-pay

## 2022-10-08 DIAGNOSIS — Z5181 Encounter for therapeutic drug level monitoring: Secondary | ICD-10-CM

## 2022-10-08 DIAGNOSIS — Z79899 Other long term (current) drug therapy: Secondary | ICD-10-CM

## 2022-10-08 DIAGNOSIS — D869 Sarcoidosis, unspecified: Secondary | ICD-10-CM

## 2022-10-08 NOTE — Telephone Encounter (Signed)
Patient called in after waiting at [facility] for blood draw. Patient states [facility] has to call us every time for orders because she was told they cannot see the orders. Wanted to know would it be that way every time? Or should she be able to go in for blood draw and have the orders ready?  Patient goes to [facility] in [place].  Please advise.

## 2022-10-09 LAB — CBC WITH DIFFERENTIAL/PLATELET
Basophils Absolute: 0 10*3/uL (ref 0.0–0.2)
Basos: 0 %
EOS (ABSOLUTE): 0.1 10*3/uL (ref 0.0–0.4)
Eos: 1 %
Hematocrit: 35.9 % (ref 34.0–46.6)
Hemoglobin: 11.8 g/dL (ref 11.1–15.9)
Immature Grans (Abs): 0 10*3/uL (ref 0.0–0.1)
Immature Granulocytes: 0 %
Lymphocytes Absolute: 1.7 10*3/uL (ref 0.7–3.1)
Lymphs: 24 %
MCH: 29.5 pg (ref 26.6–33.0)
MCHC: 32.9 g/dL (ref 31.5–35.7)
MCV: 90 fL (ref 79–97)
Monocytes Absolute: 0.5 10*3/uL (ref 0.1–0.9)
Monocytes: 7 %
Neutrophils Absolute: 4.6 10*3/uL (ref 1.4–7.0)
Neutrophils: 68 %
Platelets: 230 10*3/uL (ref 150–450)
RBC: 4 x10E6/uL (ref 3.77–5.28)
RDW: 12.9 % (ref 11.7–15.4)
WBC: 6.9 10*3/uL (ref 3.4–10.8)

## 2022-10-09 LAB — COMPREHENSIVE METABOLIC PANEL
ALT: 11 IU/L (ref 0–32)
AST: 18 IU/L (ref 0–40)
Albumin: 4.1 g/dL (ref 3.9–4.9)
Alkaline Phosphatase: 92 IU/L (ref 44–121)
BUN/Creatinine Ratio: 15 (ref 12–28)
BUN: 12 mg/dL (ref 8–27)
Bilirubin Total: <0.2 mg/dL (ref 0.0–1.2)
CO2: 25 mmol/L (ref 20–29)
Calcium: 9.4 mg/dL (ref 8.7–10.3)
Chloride: 102 mmol/L (ref 96–106)
Creatinine, Ser: 0.78 mg/dL (ref 0.57–1.00)
Globulin, Total: 3.2 g/dL (ref 1.5–4.5)
Glucose: 82 mg/dL (ref 70–99)
Potassium: 4.1 mmol/L (ref 3.5–5.2)
Sodium: 139 mmol/L (ref 134–144)
Total Protein: 7.3 g/dL (ref 6.0–8.5)
eGFR: 86 mL/min/{1.73_m2} (ref >59)

## 2022-10-09 NOTE — Telephone Encounter (Signed)
Please call patient with the following information below:  It should not be like that. We order the labs under [facility] so all [facility] sites should be able to view orders.   Please have patient call us a few day ahead of time in the future before her next set of labs to ensure orders are in correctly.  Eventually she will not need labs as frequently so hopefully this will be a non-issue in the future.

## 2022-10-11 NOTE — Telephone Encounter (Signed)
Results have been relayed to the patient/authorized caretaker. The patient/authorized caretaker verbalized understanding. No questions at this time.  She has a lab on the 20th of this month. She will call a couple days prior to remind [place].

## 2022-10-15 NOTE — Telephone Encounter (Signed)
Orders for CBC with diff and CMET ordered under [facility] for 10/22/22

## 2022-10-25 ENCOUNTER — Telehealth (HOSPITAL_BASED_OUTPATIENT_CLINIC_OR_DEPARTMENT_OTHER): Payer: Self-pay | Admitting: Pulmonary Disease

## 2022-10-25 NOTE — Telephone Encounter (Signed)
Patient called stating that she missed Friday for lab draw and is going today. Expressed frustration regarding [facility] on [address] not being able to find orders. Messaged the nurse regarding this.  Patient is also stating that the Methotrexate is making her feel fatigued with lots of coughing and is now getting mouth sores while struggling to breath at times. Please advise patient would like to be on prednisone instead as she feels this is a better fit for her. Patient is requesting to speak with Dr. Everardo All regarding symptoms.

## 2022-10-25 NOTE — Telephone Encounter (Signed)
Schedule 8:00 AM video visit this week on any day that we did not have a rep coming

## 2022-10-26 ENCOUNTER — Telehealth (HOSPITAL_BASED_OUTPATIENT_CLINIC_OR_DEPARTMENT_OTHER): Admitting: Pulmonary Disease

## 2022-10-26 ENCOUNTER — Encounter (HOSPITAL_BASED_OUTPATIENT_CLINIC_OR_DEPARTMENT_OTHER): Payer: Self-pay | Admitting: Pulmonary Disease

## 2022-10-26 VITALS — Ht 60.0 in | Wt 127.0 lb

## 2022-10-26 DIAGNOSIS — D869 Sarcoidosis, unspecified: Secondary | ICD-10-CM | POA: Diagnosis not present

## 2022-10-26 DIAGNOSIS — Z79899 Other long term (current) drug therapy: Secondary | ICD-10-CM

## 2022-10-26 DIAGNOSIS — D84821 Immunodeficiency due to drugs: Secondary | ICD-10-CM | POA: Diagnosis not present

## 2022-10-26 LAB — CBC WITH DIFFERENTIAL/PLATELET
Basophils Absolute: 0 10*3/uL (ref 0.0–0.2)
Basos: 0 %
EOS (ABSOLUTE): 0.1 10*3/uL (ref 0.0–0.4)
Eos: 1 %
Hematocrit: 35.6 % (ref 34.0–46.6)
Hemoglobin: 11.6 g/dL (ref 11.1–15.9)
Immature Grans (Abs): 0 10*3/uL (ref 0.0–0.1)
Immature Granulocytes: 0 %
Lymphocytes Absolute: 1.2 10*3/uL (ref 0.7–3.1)
Lymphs: 17 %
MCH: 30.2 pg (ref 26.6–33.0)
MCHC: 32.6 g/dL (ref 31.5–35.7)
MCV: 93 fL (ref 79–97)
Monocytes Absolute: 0.5 10*3/uL (ref 0.1–0.9)
Monocytes: 6 %
Neutrophils Absolute: 5.5 10*3/uL (ref 1.4–7.0)
Neutrophils: 76 %
Platelets: 242 10*3/uL (ref 150–450)
RBC: 3.84 x10E6/uL (ref 3.77–5.28)
RDW: 13 % (ref 11.7–15.4)
WBC: 7.4 10*3/uL (ref 3.4–10.8)

## 2022-10-26 LAB — COMPREHENSIVE METABOLIC PANEL
ALT: 22 IU/L (ref 0–32)
AST: 26 IU/L (ref 0–40)
Albumin: 4 g/dL (ref 3.9–4.9)
Alkaline Phosphatase: 82 IU/L (ref 44–121)
BUN/Creatinine Ratio: 17 (ref 12–28)
BUN: 12 mg/dL (ref 8–27)
Bilirubin Total: <0.2 mg/dL (ref 0.0–1.2)
CO2: 26 mmol/L (ref 20–29)
Calcium: 9.2 mg/dL (ref 8.7–10.3)
Chloride: 103 mmol/L (ref 96–106)
Creatinine, Ser: 0.7 mg/dL (ref 0.57–1.00)
Globulin, Total: 2.7 g/dL (ref 1.5–4.5)
Glucose: 88 mg/dL (ref 70–99)
Potassium: 4.1 mmol/L (ref 3.5–5.2)
Sodium: 139 mmol/L (ref 134–144)
Total Protein: 6.7 g/dL (ref 6.0–8.5)
eGFR: 98 mL/min/{1.73_m2} (ref >59)

## 2022-10-26 MED ORDER — PREDNISONE 10 MG PO TABS: 20.0000 mg | ORAL_TABLET | Freq: Every day | ORAL | 0 refills | Status: DC

## 2022-10-26 NOTE — Telephone Encounter (Signed)
Scheduled for 10/26/22 video visit

## 2022-10-26 NOTE — Progress Notes (Signed)
Subjective:   PATIENT ID: Terri Newman: female DOB: 25-Nov-1960, MRN: [id number]   HPI  Chief Complaint  Patient presents with   Follow-up    Reason for Visit: Follow-up sarcoid  [name] is a 62 year old female with sarcoidosis, GERD, hx H. Pylori who presents for follow-up.  01/09/21 She was previously seen by Dr. Sherene Newman since 01/2020 for sarcoid management. Was followed by Terri Newman before this. She was diagnosed with sarcoidosis in 2013 via mediastinoscopy at [facility]. She was treated with prednisone for 8 months with no recurrence off steroids. Cough was her primary symptoms with sarcoid flare. She reports fatigue but still able to work (lift boxes, walking frequently)  2022 - chronic nonproductive cough for last five months. GI work-up was negative including neg H.pylori. Recent cardiac work-up also neg. Cough does not interfere with her activities. She reports fatigue but attributes this to sleeping five hours a night. 2023 - Prednisone 10 mg x 3 months. Coughing has improved but not resolved.Increased to prednisone 20 mg. Has tolerated well except for weight gain (12lb). Weaned back to prednisone 10 mg daily. Tolerating well. Has some shortness of breath that requires albuterol once a month. Weaned off steroids in December 2024 - Started on Advair with improvement in cough. Fatigue has worsened  07/12/22 Since our last visit she has been started on Advair. She reports cough occurs every once a while compared to daily before. Not needing rescue inhaler. No shortness of breath or wheezing. Some fatigue that is unchanged.  08/17/22 Since our last visit she reports right sided back pain that will intermittently occur. She had [facility] at [facility] on [facility]. Not faxed yet or available in EMR. Had some mild nausea and mouth sores that self resolved. Her level of fatigue is unchanged on or off methotrexate  09/09/22 Since our last visit, back pain has resolved.  Has been off methorexate for the last four weeks. Continues to have fatigue  09/28/22 Since our last visit she is tolerating methotrexate 10 mg weekly. No oral ulcers. Unchanged fatigue and shortness of breath.  10/26/22 Since our last visit she reports feeling more fatigued with the methotrexate and coughing has worsened. Started having mouth sores after starting methotrexate 20 mg weekly that caused mouth pain/dryness. Also noticed skin sensitivity around her eyes. In general skin itches and also had some nausea. Mouth sores have healed but her coughing is noticeably worse.  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 pylori infection 09/2019   Treated with bismuth subsalicylate, tetracycline, metronidazole, and PPI twice daily x14 days.  Documented eradication November 2022 via breath test.   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 8 g 2   fluticasone-salmeterol (ADVAIR HFA) 115-21 MCG/ACT inhaler Inhale 2 puffs into the lungs 2 (two) times daily. 1 each 12   folic acid (FOLVITE) 1 MG tablet Take 2 tablets (2 mg total) by mouth daily. 180 tablet 3   methotrexate (RHEUMATREX) 2.5 MG tablet Take 3 tabs once weekly x 2 weeks, then 4 tabs once weekly x 2 wks, then 5 tabs weekly x 2 wks, then 6 tabs weekly x 2 wks, then 7 tabs weekly x 2 wks, then 8 tabs weekly thereater 82 tablet 0   pantoprazole (PROTONIX) 40 MG tablet Take 1 tablet (40 mg total) by mouth 2 (two) times daily. TAKE 1 TABLET(40 MG) BY MOUTH TWICE DAILY 60 tablet 11   No facility-administered medications prior to visit.    Review of Systems  Constitutional:  Positive for malaise/fatigue. Negative for chills, diaphoresis, fever and weight loss.  HENT:  Positive for sore throat. Negative for congestion.   Respiratory:  Positive for cough. Negative for hemoptysis, sputum production, shortness of breath and wheezing.   Cardiovascular:  Negative for chest pain, palpitations and leg swelling.     Objective:   Vitals:   10/26/22 1455  Weight: 127 lb (57.6 kg)  Height: 5' (1.524 m)       Physical Exam: General: Well-appearing, no acute distress HENT: [place], AT Eyes: EOMI, no scleral icterus Respiratory: No respiratory distress Extremities:-Edema,-tenderness Neuro: AAO x4, CNII-XII grossly intact Psych: Normal mood, normal affect  Data Reviewed:  Imaging: CXR 10/15/20 - Normal. No adenopathy. No infiltrate effusion or edema PET/CT 03/10/21 - Normal cardiac perfusion and function. Hypermetabolic activity in mediastinal adenopathy and bilateral lower lobe opacities. No pleural effusions. PET/CT 12/03/21 - Low-level avidity,  nonspecific. No hypermetabolic activity in adenopathy. Interval visualization of mild pulmonary fibrosis with apical to basal gradient with associated ground glass and some traction bronchiectasis. No honeycombing. CT Chest 06/17/22 - Mild worsened fibrosis. Subpleural GGO and reticulation bilaterally with moderate traction bronchiectasis  PFT: 01/21/14 FVC 2.65 (84%) FEV1 2.36 (95%) Ratio 86  TLC 92% DLCO 82% Interpretation: Normal spirometry  02/10/21 FVC 1.72 (60%) FEV1 1.55 (70%) Ratio 88 TLC 76% DLCO 54% Interpretation: Mild restrictive lung defect with moderately reduced diffusing capacity. No significant bronchodilator response. Small air disease present.   Labs:    Latest Ref Rng & Units 10/25/2022    4:10 PM 10/08/2022    4:14 PM 08/12/2022    4:26 PM  CBC  WBC 3.4 - 10.8 x10E3/uL 7.4  6.9  7.4   Hemoglobin 11.1 - 15.9 g/dL 40.9  81.1  91.4   Hematocrit 34.0 - 46.6 % 35.6  35.9  35.9   Platelets 150 - 450 x10E3/uL 242  230  223         Latest Ref Rng & Units 10/25/2022  4:07 PM 10/08/2022    4:14 PM 08/12/2022    4:26 PM  CMP  Glucose 70 - 99 mg/dL 88  82  86   BUN 8 - 27 mg/dL 12  12  18    Creatinine 0.57 - 1.00 mg/dL 1.61  0.96  0.45   Sodium 134 - 144 mmol/L 139  139  138   Potassium 3.5 - 5.2 mmol/L 4.1  4.1  4.6   Chloride 96 - 106 mmol/L 103  102  102   CO2 20 - 29 mmol/L 26  25  23    Calcium 8.7 - 10.3 mg/dL 9.2  9.4  9.6   Total Protein 6.0 - 8.5 g/dL 6.7  7.3  7.5   Total Bilirubin 0.0 - 1.2 mg/dL <4.0  <9.8  0.2   Alkaline Phos 44 - 121 IU/L 82  92  80   AST 0 - 40 IU/L 26  18  15    ALT 0 - 32 IU/L 22  11  14      EKG: 01/02/21 NSR  Assessment & Plan:   Discussion: 62 year old female with sarcoidosis, GERD who presents for follow-up for sarcoid flare. Unable to tolerate methotrexate due to side effects. Discontinue methotrexate with plan to restart prednisone daily. Discussed options including infliximab and repeat imaging if able to tolerate this. 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 Hx of side effects (agitation, sleep, weight gain) - Annual PFTs. Last PFTs 02/2021 - Recommend annual ophthalmology exam.  Last visit unknown - EKG  reviewed. No evidence of conduction abnormalities.  - PET/CT 03/12/21 with active pulmonary sarcoid - PET/CT 12/03/21 - low-level avidity. Improved flare. 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 Rinse mouth out after use to prevent thrush --CONTINUE Albuterol AS NEEDED   Health Maintenance Immunization History  Administered Date(s) Administered   Influenza Inj Mdck Quad Pf 11/22/2020   Influenza Split 12/04/2019   Influenza, Seasonal, Injecte, Preservative Fre 02/20/2016   Influenza,inj,Quad PF,6+ Mos 12/08/2016, 11/08/2018   Influenza,inj,quad, With Preservative 11/28/2017   Influenza-Unspecified 01/16/2014, 12/03/2014, 12/03/2019, 11/22/2020   Pneumococcal Polysaccharide-23 02/27/2013   Tdap 04/15/2021, 06/01/2021   CT Lung Screen - never smoker  No orders of the defined types were placed in this encounter.  Meds ordered this encounter  Medications   predniSONE  (DELTASONE) 10 MG tablet    Sig: Take 2 tablets (20 mg total) by mouth daily with breakfast.    Dispense:  30 tablet    Refill:  0   No follow-ups on file.   I have spent a total time of 10-minutes on the day of the appointment including chart review, data review, collecting history, coordinating care and discussing medical diagnosis and plan with the patient/family. Past medical history, allergies, medications  were reviewed. Pertinent imaging, labs and tests included in this note have been reviewed and interpreted independently by me.  Edric Fetterman Mechele Collin, MD [facility] 10/26/2022 3:35 PM  Office Number [phone]

## 2022-10-26 NOTE — Patient Instructions (Addendum)
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

## 2022-11-11 ENCOUNTER — Other Ambulatory Visit (HOSPITAL_BASED_OUTPATIENT_CLINIC_OR_DEPARTMENT_OTHER): Payer: Self-pay

## 2022-11-11 ENCOUNTER — Ambulatory Visit (HOSPITAL_BASED_OUTPATIENT_CLINIC_OR_DEPARTMENT_OTHER): Admitting: Pulmonary Disease

## 2022-11-11 DIAGNOSIS — D869 Sarcoidosis, unspecified: Secondary | ICD-10-CM

## 2022-11-11 MED ORDER — PREDNISONE 10 MG PO TABS: 20.0000 mg | ORAL_TABLET | Freq: Every day | ORAL | 0 refills | Status: DC

## 2022-11-11 NOTE — Progress Notes (Signed)
Full PFT Performed Today  

## 2022-11-11 NOTE — Patient Instructions (Signed)
Full PFT Performed Today  

## 2022-11-15 ENCOUNTER — Ambulatory Visit (HOSPITAL_BASED_OUTPATIENT_CLINIC_OR_DEPARTMENT_OTHER): Admitting: Pulmonary Disease

## 2022-11-15 ENCOUNTER — Encounter (HOSPITAL_BASED_OUTPATIENT_CLINIC_OR_DEPARTMENT_OTHER): Payer: Self-pay | Admitting: Pulmonary Disease

## 2022-11-15 VITALS — BP 98/74 | HR 75 | Resp 16 | Ht 60.0 in | Wt 131.0 lb

## 2022-11-15 DIAGNOSIS — D84821 Other long term (current) drug therapy: Secondary | ICD-10-CM

## 2022-11-15 DIAGNOSIS — Z79899 Other long term (current) drug therapy: Secondary | ICD-10-CM | POA: Diagnosis not present

## 2022-11-15 DIAGNOSIS — D869 Sarcoidosis, unspecified: Secondary | ICD-10-CM | POA: Diagnosis not present

## 2022-11-15 NOTE — Progress Notes (Signed)
Subjective:   PATIENT ID: Terri Newman GENDER: female DOB: 1960-04-04, MRN: [id number]   HPI  Chief Complaint  Patient presents with   Follow-up    Sarcoidosis-breathing and cough are both better. Patient has a sore throat, covid negative thinks its a cold.     Reason for Visit: Follow-up sarcoid  [name] is a 62 year old female with sarcoidosis, GERD, hx H. Pylori who presents for follow-up.  01/09/21 She was previously seen by Dr. Sherene Sires since 01/2020 for sarcoid management. Was followed by Juanetta Gosling before this. She was diagnosed with sarcoidosis in 2013 via mediastinoscopy at [facility]. She was treated with prednisone for 8 months with no recurrence off steroids. Cough was her primary symptoms with sarcoid flare. She reports fatigue but still able to work (lift boxes, walking frequently)  2022 - chronic nonproductive cough for last five months. GI work-up was negative including neg H.pylori. Recent cardiac work-up also neg. Cough does not interfere with her activities. She reports fatigue but attributes this to sleeping five hours a night. 2023 - Prednisone 10 mg x 3 months. Coughing has improved but not resolved.Increased to prednisone 20 mg. Has tolerated well except for weight gain (12lb). Weaned back to prednisone 10 mg daily. Tolerating well. Has some shortness of breath that requires albuterol once a month. Weaned off steroids in December 2024 - Started on Advair with improvement in cough. Fatigue has worsened  07/12/22 Since our last visit she has been started on Advair. She reports cough occurs every once a while compared to daily before. Not needing rescue inhaler. No shortness of breath or wheezing. Some fatigue that is unchanged.  08/17/22 Since our last visit she reports right sided back pain that will intermittently occur. She had [facility] at [facility] on [facility]. Not faxed yet or available in EMR. Had some mild nausea and mouth sores that self resolved.  Her level of fatigue is unchanged on or off methotrexate  09/09/22 Since our last visit, back pain has resolved. Has been off methorexate for the last four weeks. Continues to have fatigue  09/28/22 Since our last visit she is tolerating methotrexate 10 mg weekly. No oral ulcers. Unchanged fatigue and shortness of breath.  10/26/22 Since our last visit she reports feeling more fatigued with the methotrexate and coughing has worsened. Started having mouth sores after starting methotrexate 20 mg weekly that caused mouth pain/dryness. Also noticed skin sensitivity around her eyes. In general skin itches and also had some nausea. Mouth sores have healed but her coughing is noticeably worse.  11/15/22 She remains on prednisone 20 mg daily. Reports sore throat. Denies fever, chills. Coughing and fatigue has improved after restarting steroids.  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 pylori infection 09/2019   Treated with bismuth subsalicylate, tetracycline, metronidazole, and PPI twice daily x14 days.  Documented eradication November 2022 via breath test.   Helicobacter pylori gastritis 08/2013   ABO  BID FOR 10 DAYS   Neuromuscular disorder (HCC)    chronic back pain   PONV (postoperative nausea and vomiting) 11/07/2012   Sarcoidosis    Sarcoidosis      Family History  Problem Relation Age of Onset  Heart attack Mother    Alcohol abuse Mother    Heart attack Father    Alcohol abuse Father    Colon cancer Neg Hx      Social History   Occupational History   Occupation: International aid/development worker    Comment: makes ranch dressing, pasta sauce, salsa, and other food produces   Occupation: Insurance account manager   Tobacco Use   Smoking status: Never    Passive exposure: Never   Smokeless tobacco: Never  Vaping Use   Vaping status: Never Used  Substance and Sexual Activity   Alcohol use: No   Drug use: No   Sexual activity: Not Currently    No Known Allergies   Outpatient Medications Prior to Visit  Medication Sig Dispense Refill   albuterol (VENTOLIN HFA) 108 (90 Base) MCG/ACT inhaler Inhale 2 puffs into the lungs every 6 (six) hours as needed for wheezing or shortness of breath. 8 g 2   fluticasone-salmeterol (ADVAIR HFA) 115-21 MCG/ACT inhaler Inhale 2 puffs into the lungs 2 (two) times daily. 1 each 12   folic acid (FOLVITE) 1 MG tablet Take 2 tablets (2 mg total) by mouth daily. 180 tablet 3   methotrexate (RHEUMATREX) 2.5 MG tablet Take 3 tabs once weekly x 2 weeks, then 4 tabs once weekly x 2 wks, then 5 tabs weekly x 2 wks, then 6 tabs weekly x 2 wks, then 7 tabs weekly x 2 wks, then 8 tabs weekly thereater 82 tablet 0   pantoprazole (PROTONIX) 40 MG tablet Take 1 tablet (40 mg total) by mouth 2 (two) times daily. TAKE 1 TABLET(40 MG) BY MOUTH TWICE DAILY 60 tablet 11   predniSONE (DELTASONE) 10 MG tablet Take 2 tablets (20 mg total) by mouth daily with breakfast. 30 tablet 0   No facility-administered medications prior to visit.    Review of Systems  Constitutional:  Negative for chills, diaphoresis, fever, malaise/fatigue and weight loss.  HENT:  Negative for congestion.   Respiratory:  Negative for cough, hemoptysis, sputum production, shortness of breath and wheezing.   Cardiovascular:  Negative for chest pain, palpitations and leg swelling.     Objective:   Vitals:   11/15/22 1548  BP: 98/74  Pulse: 75  Resp: 16  SpO2: 96%  Weight: 131 lb (59.4 kg)  Height: 5' (1.524 m)     SpO2: 96 %  Physical Exam: General: Well-appearing, no acute distress HENT: [place], AT Eyes: EOMI, no scleral icterus Respiratory: Clear to auscultation bilaterally.  No crackles, wheezing or  rales Cardiovascular: RRR, -M/R/G, no JVD Extremities:-Edema,-tenderness Neuro: AAO x4, CNII-XII grossly intact Psych: Normal mood, normal affect   Data Reviewed:  Imaging: CXR 10/15/20 - Normal. No adenopathy. No infiltrate effusion or edema PET/CT 03/10/21 - Normal cardiac perfusion and function. Hypermetabolic activity in mediastinal adenopathy and bilateral lower lobe opacities. No pleural effusions. PET/CT 12/03/21 - Low-level avidity, nonspecific. No hypermetabolic activity in adenopathy. Interval visualization of mild pulmonary fibrosis with apical to basal gradient with associated ground glass and some traction bronchiectasis. No honeycombing. CT Chest 06/17/22 - Mild worsened fibrosis. Subpleural GGO and reticulation bilaterally with moderate traction bronchiectasis  PFT: 01/21/14 FVC 2.65 (84%) FEV1 2.36 (95%) Ratio 86  TLC 92% DLCO 82% Interpretation: Normal spirometry  02/10/21 FVC 1.72 (60%) FEV1 1.55 (70%) Ratio 88 TLC 76% DLCO 54% Interpretation: Mild restrictive lung defect with moderately reduced diffusing capacity. No significant bronchodilator response. Small air disease present.   11/15/22 FVC 1.46 (51%) FEV1 1.27 (58%) Ratio 87  TLC 62% DLCO 67% Interpretation: Moderately severe restrictive defect with mildly reduced diffusing capacity.   Labs:    Latest Ref Rng & Units 10/25/2022    4:10 PM 10/08/2022    4:14 PM 08/12/2022    4:26 PM  CBC  WBC 3.4 - 10.8 x10E3/uL 7.4  6.9  7.4   Hemoglobin 11.1 - 15.9 g/dL 21.3  08.6  57.8   Hematocrit 34.0 - 46.6 % 35.6  35.9  35.9   Platelets 150 - 450 x10E3/uL 242  230  223         Latest Ref Rng & Units 10/25/2022    4:07 PM 10/08/2022    4:14 PM 08/12/2022    4:26 PM  CMP  Glucose 70 - 99 mg/dL 88  82  86   BUN 8 - 27 mg/dL 12  12  18    Creatinine 0.57 - 1.00 mg/dL 4.69  6.29  5.28   Sodium 134 - 144 mmol/L 139  139  138   Potassium 3.5 - 5.2 mmol/L 4.1  4.1  4.6   Chloride 96 - 106 mmol/L 103  102  102   CO2 20 - 29  mmol/L 26  25  23    Calcium 8.7 - 10.3 mg/dL 9.2  9.4  9.6   Total Protein 6.0 - 8.5 g/dL 6.7  7.3  7.5   Total Bilirubin 0.0 - 1.2 mg/dL <4.1  <3.2  0.2   Alkaline Phos 44 - 121 IU/L 82  92  80   AST 0 - 40 IU/L 26  18  15    ALT 0 - 32 IU/L 22  11  14      EKG: 01/02/21 NSR  Assessment & Plan:   Discussion: 62 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 Unable to tolerate methotrexate. Prednisone daily was restarted. Discussed infliximab as an alternative agent as she needs ongoing immunosuppression for her sarcoid flare. PFTs with worsening restrictive defect though DLCO has improved to mild. After discussion and addressing questions and concerns, patient agreeable to starting infliximab with plan to wean prednisone.  We discussed the clinical course of sarcoid and management including serial PFTs, labs, eye exam, and EKG and chest imaging if indicated. 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 Hx of side effects (agitation, sleep, weight gain) - Annual PFTs. Last PFTs 02/2021 - Recommend annual ophthalmology exam.  Last visit unknown - EKG  reviewed. No evidence of conduction abnormalities.  - PET/CT 03/12/21 with active pulmonary sarcoid - PET/CT 12/03/21 - low-level avidity. Improved flare. Interval mild fibrosis seen L>R - CT Chest 06/17/22 - mildly worsening fibrosis  High risk medical management Prednisone 10 mg 03/18/21-06/16/21 Prednisone 20 mg 06/16/21- 09/21/21 Prednisone 10 mg 09/21/21- 01/21/22 Methotrexate 7.5 mg 6/30-7/13/24 Methotrexate 10 mg 7/14-7/16/24 due to abdominal pain Methotrexate 20 mg 10/26/22 discontinued --CONTINUE prednisone 20 mg daily on 10/31/22 --START infliximab. Will message pharmacy. Plan to decrease prednisone once patient receives 2nd dose of infliximab  Chronic cough 2/2 sarcoid +/-  fibrosis Etiology of fibrosis could be from post-viral/inflammatory process, prior sarcoid flare --CONTINUE Advair 115-21 mcg TWO puffs in the morning and evening. Rinse mouth out after use to prevent thrush --CONTINUE Albuterol AS NEEDED   Health Maintenance Immunization History  Administered Date(s) Administered   Influenza Inj Mdck Quad Pf 11/22/2020   Influenza Split 12/04/2019  Influenza, Seasonal, Injecte, Preservative Fre 02/20/2016   Influenza,inj,Quad PF,6+ Mos 12/08/2016, 11/08/2018   Influenza,inj,quad, With Preservative 11/28/2017   Influenza-Unspecified 01/16/2014, 12/03/2014, 12/03/2019, 11/22/2020   Pneumococcal Polysaccharide-23 02/27/2013   Tdap 04/15/2021, 06/01/2021   CT Lung Screen - never smoker  Orders Placed This Encounter  Procedures   CT Chest Wo Contrast    Standing Status:   Future    Standing Expiration Date:   11/15/2023    Scheduling Instructions:     Schedule in early December 2024    Order Specific Question:   Preferred imaging location?    Answer:   [facility]   No orders of the defined types were placed in this encounter.  No follow-ups on file.   I have spent a total time of 45-minutes on the day of the appointment including chart review, data review, collecting history, coordinating care and discussing medical diagnosis and plan with the patient/family. Past medical history, allergies, medications were reviewed. Pertinent imaging, labs and tests included in this note have been reviewed and interpreted independently by me.  Alexea Blase Mechele Collin, MD [facility] 11/15/2022 4:07 PM  Office Number [phone]

## 2022-11-15 NOTE — Patient Instructions (Addendum)
Sarcoidosis --CONTINUE prednisone 20 mg daily on 10/31/22 --START infliximab. Will message pharmacy. Plan to decrease prednisone once patient receives 2nd dose of infliximab --ORDER CT Chest without contrast 01/2023

## 2022-11-17 ENCOUNTER — Other Ambulatory Visit: Payer: Self-pay | Admitting: Pharmacist

## 2022-11-17 ENCOUNTER — Ambulatory Visit (INDEPENDENT_AMBULATORY_CARE_PROVIDER_SITE_OTHER): Admitting: Family Medicine

## 2022-11-17 ENCOUNTER — Telehealth: Payer: Self-pay | Admitting: Pharmacist

## 2022-11-17 ENCOUNTER — Encounter: Payer: Self-pay | Admitting: Family Medicine

## 2022-11-17 VITALS — BP 118/64 | HR 70 | Ht 61.0 in | Wt 129.1 lb

## 2022-11-17 DIAGNOSIS — E7849 Other hyperlipidemia: Secondary | ICD-10-CM

## 2022-11-17 DIAGNOSIS — K219 Gastro-esophageal reflux disease without esophagitis: Secondary | ICD-10-CM

## 2022-11-17 DIAGNOSIS — E038 Other specified hypothyroidism: Secondary | ICD-10-CM

## 2022-11-17 DIAGNOSIS — E559 Vitamin D deficiency, unspecified: Secondary | ICD-10-CM

## 2022-11-17 DIAGNOSIS — R7301 Impaired fasting glucose: Secondary | ICD-10-CM

## 2022-11-17 DIAGNOSIS — F419 Anxiety disorder, unspecified: Secondary | ICD-10-CM | POA: Diagnosis not present

## 2022-11-17 MED ORDER — HYDROXYZINE PAMOATE 25 MG PO CAPS
25.0000 mg | ORAL_CAPSULE | Freq: Three times a day (TID) | ORAL | 0 refills | Status: DC | PRN
Start: 2022-11-17 — End: 2023-03-21

## 2022-11-17 NOTE — Telephone Encounter (Signed)
ATC patient regarding infliximab infusions. Unable to reach. Left VM with my direct callback number  Chesley Mires, PharmD, MPH, BCPS, CPP Clinical Pharmacist (Rheumatology and Pulmonology)

## 2022-11-17 NOTE — Progress Notes (Signed)
Therapy plan placed for Remicade ([id number]) for [facility] to start benefits investigation  Diagnosis: sarcoidosis; failed MTX and now on prednisone 20mg  daily  Provider: Dr. Luciano Cutter [facility])  Dose:  3 mg/kg at 0, 2, and 6 weeks, followed by a maintenance regimen of 3 mg/kg every 8 weeks thereafter  Last Clinic Visit: 11/15/22 Next Clinic Visit: not scheduled  Pertinent Labs: hepatitis panel negative on 07/12/2022, TB gold negative on 07/12/2022, CBC on 10/25/22, CMET on 10/25/22   Chesley Mires, PharmD, MPH, BCPS, CPP Clinical Pharmacist (Rheumatology and Pulmonology)

## 2022-11-17 NOTE — Telephone Encounter (Signed)
Pharmacy Note Subjective: Patient called today by [facility] pharmacist for counseling on infliximab for sarcoidosis.  Prior therapy includes: MTX (Caused mouth sores and significant fatigue). She is currently taking prednisone 20mg  daily. States that methotrexate really did not help much with her cough  Diagnosis of heart failure: No  Objective:  CBC    Component Value Date/Time   WBC 7.4 10/25/2022 1610   WBC 7.6 03/18/2021 1447   RBC 3.84 10/25/2022 1610   RBC 3.82 (L) 03/18/2021 1447   HGB 11.6 10/25/2022 1610   HCT 35.6 10/25/2022 1610   HCT 37 08/05/2017 0746   PLT 242 10/25/2022 1610   MCV 93 10/25/2022 1610   MCV 88.6 08/05/2017 0746   MCH 30.2 10/25/2022 1610   MCH 29.0 09/07/2019 0852   MCHC 32.6 10/25/2022 1610   MCHC 32.9 03/18/2021 1447   RDW 13.0 10/25/2022 1610   RDW 12.6 08/05/2017 0746   LYMPHSABS 1.2 10/25/2022 1610   MONOABS 0.4 03/18/2021 1447   EOSABS 0.1 10/25/2022 1610   EOSABS 80 08/05/2017 0746   BASOSABS 0.0 10/25/2022 1610     CMP     Component Value Date/Time   NA 139 10/25/2022 1607   NA 138 08/05/2017 0746   K 4.1 10/25/2022 1607   K 4.5 08/05/2017 0746   CL 103 10/25/2022 1607   CL 104 08/05/2017 0746   CO2 26 10/25/2022 1607   CO2 29 08/05/2017 0746   GLUCOSE 88 10/25/2022 1607   GLUCOSE 100 (H) 06/16/2021 1614   BUN 12 10/25/2022 1607   CREATININE 0.70 10/25/2022 1607   CREATININE 0.80 09/07/2019 0855   CALCIUM 9.2 10/25/2022 1607   CALCIUM 9.5 08/05/2017 0746   PROT 6.7 10/25/2022 1607   PROT 7.0 08/05/2017 0746   ALBUMIN 4.0 10/25/2022 1607   AST 26 10/25/2022 1607   AST 13 08/05/2017 0746   ALT 22 10/25/2022 1607   ALT 12 08/05/2017 0746   ALKPHOS 82 10/25/2022 1607   ALKPHOS 75 08/05/2017 0746   BILITOT <0.2 10/25/2022 1607   BILITOT 0.4 08/05/2017 0746   GFRNONAA 81 09/07/2019 0855   GFRAA 94 09/07/2019 0855      Baseline Immunosuppressant Therapy Labs TB GOLD    Latest Ref Rng & Units 07/12/2022     2:19 PM  Quantiferon TB Gold  Quantiferon TB Gold Plus Negative Negative    Hepatitis Panel    Latest Ref Rng & Units 07/12/2022    2:19 PM  Hepatitis  Hep B Surface Ag Negative Negative   Hep B IgM Negative Negative   Hepatitis C antibody non reactive on 07/12/2022  HIV Lab Results  Component Value Date   HIV Non Reactive 09/25/2021   Immunoglobulins    Latest Ref Rng & Units 09/07/2019    8:55 AM  Immunoglobulin Electrophoresis  IgA  47 - 310 mg/dL 696    SPEP    Latest Ref Rng & Units 10/25/2022    4:07 PM  Serum Protein Electrophoresis  Total Protein 6.0 - 8.5 g/dL 6.7    Chest x-ray: 2/95/2841  Assessment/Plan:  Counseled patient that infliximab is a TNF blocking agent.  Counseled patient on purpose, proper use, and adverse effects of infliximab.  Reviewed the most common adverse effects including infections, infusion reactions, headache, and injection site reactions. Discussed that there is the possibility of an increased risk of malignancy including non-melanoma skin cancer but it is not well understood if this increased risk is due to the medication  or the disease state.  Advised patient to get yearly dermatology exams due to risk of skin cancer. Counseled patient that infliximab should be held prior to scheduled surgery.  Counseled patient to avoid live vaccines while on infliximab.  Recommend annual influenza, PCV 15 or PCV20 or Pneumovax 23, and Shingrix as indicated.  Reviewed the importance of regular labs while on infliximab therapy. Will monitor CBC and CMP 1 month after starting and then every 3 months routinely thereafter. Will monitor TB gold annually. Standing orders placed. All questions encouraged and answered. Patient verbalized understanding.   Dose will be infliximab 3 mg/kg at 0, 2, and 6 weeks, followed by a maintenance regimen of 3 mg/kg every 8 weeks thereafter.  Referral for infusions placed to [facility] since patient lives in  [place]. Dr. Everardo All would like to start tapering prednisone after Week 2 infusion.  Chesley Mires, PharmD, MPH, BCPS, CPP Clinical Pharmacist (Rheumatology and Pulmonology)

## 2022-11-17 NOTE — Progress Notes (Unsigned)
Established Patient Office Visit  Subjective:  Patient ID: Terri Newman, female    DOB: 02-12-1960  Age: 62 y.o. MRN: [id number]  CC:  Chief Complaint  Patient presents with   Care Management    Follow up would like to check thyroid levels and other blood work.    Anxiety    Pt would like a refill on medication.     HPI Terri Newman is a 62 y.o. female with past medical history of sarcoidosis, GERD, and anxiety presents for f/u of  chronic medical conditions.  Anxiety:The patient reports increased worrying and requests to be started on an as-needed medication for anxiety. She denies suicidal thoughts or ideation. Additionally, she complains of difficulty sleeping at times due to excessive worrying.   GERD: She reports treatment compliance on Protonix 40 mg twice daily.  Past Medical History:  Diagnosis Date   Adenomatous polyp of colon 08/2013   TWO, REDUNDANT LEFT COLON   Constipation by delayed colonic transit 08/23/2013   GERD (gastroesophageal reflux disease)    Phreesia 06/06/2020   H. pylori infection 09/2019   Treated with bismuth subsalicylate, tetracycline, metronidazole, and PPI twice daily x14 days.  Documented eradication November 2022 via breath test.   Helicobacter pylori gastritis 08/2013   ABO  BID FOR 10 DAYS   Neuromuscular disorder (HCC)    chronic back pain   PONV (postoperative nausea and vomiting) 11/07/2012   Sarcoidosis    Sarcoidosis     Past Surgical History:  Procedure Laterality Date   APPENDECTOMY     BIOPSY  10/19/2019   Procedure: BIOPSY;  Surgeon: Lanelle Bal, DO;  Location: [facility];  Service: Endoscopy;;   CHOLECYSTECTOMY  12/2012   [facility]   COLONOSCOPY N/A 08/29/2013   [facility]: Several simple adenomas removed, moderate sized internal hemorrhoids, slightly redundant left colon.   COLONOSCOPY N/A 05/18/2019   Procedure: COLONOSCOPY;  Surgeon: West Bali, MD;   Two 5-8 mm polyps in the proximal ascending and distal  ascending colon, external hemorrhoids, tortuous left colon.  Pathology with sessile serrated polyps.  Recommended next colonoscopy in 5 years.     ESOPHAGOGASTRODUODENOSCOPY N/A 08/29/2013   [facility]: H. pylori gastritis treated with amoxicillin/Biaxin/omeprazole   ESOPHAGOGASTRODUODENOSCOPY (EGD) WITH PROPOFOL N/A 10/19/2019   Surgeon: Lanelle Bal, DO; H. pylori gastritis, benign duodenal biopsies.   NECK SURGERY     POLYPECTOMY  05/18/2019   Procedure: POLYPECTOMY;  Surgeon: West Bali, MD;  Location: [facility];  Service: Endoscopy;;  ascending x2   TUBAL LIGATION      Family History  Problem Relation Age of Onset   Heart attack Mother    Alcohol abuse Mother    Heart attack Father    Alcohol abuse Father    Colon cancer Neg Hx     Social History   Socioeconomic History   Marital status: Married    Spouse name: Not on file   Number of children: 3   Years of education: Not on file   Highest education level: 12th grade  Occupational History   Occupation: International aid/development worker    Comment: makes ranch dressing, pasta sauce, salsa, and other food produces   Occupation: Insurance account manager  Tobacco Use   Smoking status: Never    Passive exposure: Never   Smokeless tobacco: Never  Vaping Use   Vaping status: Never Used  Substance and Sexual Activity   Alcohol use: No   Drug use: No   Sexual activity: Not  Currently  Other Topics Concern   Not on file  Social History Narrative   Not on file   Social Determinants of Health   Financial Resource Strain: Low Risk  (11/16/2022)   Overall Financial Resource Strain (CARDIA)    Difficulty of Paying Living Expenses: Not hard at all  Food Insecurity: No Food Insecurity (11/16/2022)   Hunger Vital Sign    Worried About Running Out of Food in the Last Year: Never true    Ran Out of Food in the Last Year: Never true  Transportation Needs: No Transportation Needs (11/16/2022)   PRAPARE - Scientist, research (physical sciences) (Medical): No    Lack of Transportation (Non-Medical): No  Physical Activity: Unknown (11/16/2022)   Exercise Vital Sign    Days of Exercise per Week: 0 days    Minutes of Exercise per Session: Not on file  Stress: Stress Concern Present (11/16/2022)   [employer] of Occupational Health - Occupational Stress Questionnaire    Feeling of Stress : To some extent  Social Connections: Socially Integrated (11/16/2022)   Social Connection and Isolation Panel [NHANES]    Frequency of Communication with Friends and Family: More than three times a week    Frequency of Social Gatherings with Friends and Family: More than three times a week    Attends Religious Services: More than 4 times per year    Active Member of [employer] or Organizations: Yes    Attends Banker Meetings: 1 to 4 times per year    Marital Status: Married  Catering manager Violence: Not At Risk (09/01/2020)   [facility]   Humiliation, Afraid, Rape, and Kick questionnaire    Fear of Current or Ex-Partner: No    Emotionally Abused: No    Physically Abused: No    Sexually Abused: No    Outpatient Medications Prior to Visit  Medication Sig Dispense Refill   albuterol (VENTOLIN HFA) 108 (90 Base) MCG/ACT inhaler Inhale 2 puffs into the lungs every 6 (six) hours as needed for wheezing or shortness of breath. 8 g 2   fluticasone-salmeterol (ADVAIR HFA) 115-21 MCG/ACT inhaler Inhale 2 puffs into the lungs 2 (two) times daily. 1 each 12   pantoprazole (PROTONIX) 40 MG tablet Take 1 tablet (40 mg total) by mouth 2 (two) times daily. TAKE 1 TABLET(40 MG) BY MOUTH TWICE DAILY 60 tablet 11   predniSONE (DELTASONE) 10 MG tablet Take 2 tablets (20 mg total) by mouth daily with breakfast. 30 tablet 0   No facility-administered medications prior to visit.    No Known Allergies  ROS Review of Systems  Constitutional:  Negative for chills and fever.  Eyes:  Negative for  visual disturbance.  Respiratory:  Negative for chest tightness and shortness of breath.   Neurological:  Negative for dizziness and headaches.      Objective:    Physical Exam HENT:     Head: Normocephalic.     Mouth/Throat:     Mouth: Mucous membranes are moist.  Cardiovascular:     Rate and Rhythm: Normal rate.     Heart sounds: Normal heart sounds.  Pulmonary:     Effort: Pulmonary effort is normal.     Breath sounds: Normal breath sounds.  Neurological:     Mental Status: She is alert.  Psychiatric:        Mood and Affect: Mood is anxious.     BP 118/64   Pulse  70   Ht 5\' 1"  (1.549 m)   Wt 129 lb 1.3 oz (58.6 kg)   SpO2 97%   BMI 24.39 kg/m  Wt Readings from Last 3 Encounters:  11/17/22 129 lb 1.3 oz (58.6 kg)  11/15/22 131 lb (59.4 kg)  11/11/22 129 lb (58.5 kg)    Lab Results  Component Value Date   TSH 2.320 09/25/2021   Lab Results  Component Value Date   WBC 7.4 10/25/2022   HGB 11.6 10/25/2022   HCT 35.6 10/25/2022   MCV 93 10/25/2022   PLT 242 10/25/2022   Lab Results  Component Value Date   NA 139 10/25/2022   K 4.1 10/25/2022   CO2 26 10/25/2022   GLUCOSE 88 10/25/2022   BUN 12 10/25/2022   CREATININE 0.70 10/25/2022   BILITOT <0.2 10/25/2022   ALKPHOS 82 10/25/2022   AST 26 10/25/2022   ALT 22 10/25/2022   PROT 6.7 10/25/2022   ALBUMIN 4.0 10/25/2022   CALCIUM 9.2 10/25/2022   ANIONGAP 6 02/05/2019   EGFR 98 10/25/2022   GFR 67.91 06/16/2021   Lab Results  Component Value Date   CHOL 235 (H) 09/25/2021   Lab Results  Component Value Date   HDL 55 09/25/2021   Lab Results  Component Value Date   LDLCALC 160 (H) 09/25/2021   Lab Results  Component Value Date   TRIG 114 09/25/2021   Lab Results  Component Value Date   CHOLHDL 4.3 09/25/2021   Lab Results  Component Value Date   HGBA1C 5.9 (H) 09/25/2021      Assessment & Plan:  Anxiety Assessment & Plan: Encouraged to start taking hydroxyzine 25 mg every 8  hours as needed for anxiety. You may take half a tablet (12.5 mg) if the 25 mg dose causes excessive drowsiness. -Encouraged to take hydroxyzine 25 mg 30 minutes to an hour before bedtime for sleep difficulties.  Orders: -     hydrOXYzine Pamoate; Take 1 capsule (25 mg total) by mouth every 8 (eight) hours as needed for anxiety.  Dispense: 30 capsule; Refill: 0  Gastroesophageal reflux disease without esophagitis Assessment & Plan: GERD diet encouraged Encouraged to continue taking Protonix 40 mg twice daily as needed   IFG (impaired fasting glucose) -     Hemoglobin A1c  Vitamin D deficiency -     VITAMIN D 25 Hydroxy (Vit-D Deficiency, Fractures)  TSH (thyroid-stimulating hormone deficiency) -     TSH + free T4  Other hyperlipidemia -     Lipid panel -     CMP14+EGFR -     CBC with Differential/Platelet  Note: This chart has been completed using Engineer, civil (consulting) software, and while attempts have been made to ensure accuracy, certain words and phrases may not be transcribed as intended.    Follow-up: Return in about 4 months (around 03/20/2023).   Gilmore Laroche, FNP

## 2022-11-17 NOTE — Telephone Encounter (Signed)
Patient returned call - counselign completed at below  Chesley Mires, PharmD, MPH, BCPS, CPP Clinical Pharmacist (Rheumatology and Pulmonology)

## 2022-11-17 NOTE — Patient Instructions (Addendum)
I appreciate the opportunity to provide care to you today!    Follow up:  4 months  Labs: please stop by the lab during the week to get your blood drawn (CBC, CMP, TSH, Lipid profile, HgA1c, Vit D)  Anxiety: -Start taking hydroxyzine 25 mg every 8 hours as needed for anxiety. You may take half a tablet (12.5 mg) if the 25 mg dose causes excessive drowsiness. -Take hydroxyzine 25 mg 30 minutes to an hour before bedtime for sleep difficulties.  Nonpharmacologic management of anxiety  Mindfulness and Meditation Practices like mindfulness meditation can help reduce symptoms by promoting relaxation and present-moment awareness.  Exercise  Regular physical activity has been shown to improve mood and reduce anxiety through the release of endorphins and other neurochemicals.  Healthy Diet Eating a balanced diet rich in fruits, vegetables, whole grains, and lean proteins can support overall mental health.  Sleep Hygiene  Establishing a regular sleep routine and ensuring good sleep quality can significantly impact mood and anxiety levels.  Stress Management Techniques Activities such as yoga, tai chi, and deep breathing exercises can help manage stress.  Social Support Maintaining strong relationships and seeking support from friends, family, or support groups can provide emotional comfort and reduce feelings of isolation.  Lifestyle Modifications Reducing alcohol and caffeine intake, quitting smoking, and avoiding recreational drugs can improve symptoms.  Art and Music Therapy Engaging in creative activities like painting, drawing, or playing music can be therapeutic and help express emotions.  Light Therapy Particularly useful for seasonal affective disorder (SAD), exposure to bright light can help regulate mood. .     Please continue to a heart-healthy diet and increase your physical activities. Try to exercise for at least five days a week.    It was a pleasure to see you and I  look forward to continuing to work together on your health and well-being. Please do not hesitate to call the office if you need care or have questions about your care.  In case of emergency, please visit the Emergency Department for urgent care, or contact our clinic at [phone] to schedule an appointment. We're here to help you!   Have a wonderful day and week. With Gratitude, Gilmore Laroche MSN, FNP-BC

## 2022-11-18 ENCOUNTER — Ambulatory Visit: Admitting: Family Medicine

## 2022-11-18 NOTE — Assessment & Plan Note (Signed)
Encouraged to start taking hydroxyzine 25 mg every 8 hours as needed for anxiety. You may take half a tablet (12.5 mg) if the 25 mg dose causes excessive drowsiness. -Encouraged to take hydroxyzine 25 mg 30 minutes to an hour before bedtime for sleep difficulties.

## 2022-11-18 NOTE — Assessment & Plan Note (Signed)
GERD diet encouraged Encouraged to continue taking Protonix 40 mg twice daily as needed

## 2022-11-19 ENCOUNTER — Encounter (HOSPITAL_BASED_OUTPATIENT_CLINIC_OR_DEPARTMENT_OTHER): Payer: Self-pay | Admitting: Pulmonary Disease

## 2022-11-20 LAB — PULMONARY FUNCTION TEST
DL/VA % pred: 136 %
DL/VA: 5.91 ml/min/mmHg/L
DLCO cor % pred: 71 %
DLCO cor: 12.55 ml/min/mmHg
DLCO unc % pred: 67 %
DLCO unc: 11.8 ml/min/mmHg
FEF 25-75 Post: 1.59 L/s
FEF 25-75 Pre: 1.35 L/s
FEF2575-%Change-Post: 18 %
FEF2575-%Pred-Post: 76 %
FEF2575-%Pred-Pre: 64 %
FEV1-%Change-Post: -3 %
FEV1-%Pred-Post: 58 %
FEV1-%Pred-Pre: 61 %
FEV1-Post: 1.27 L
FEV1-Pre: 1.32 L
FEV1FVC-%Change-Post: -2 %
FEV1FVC-%Pred-Pre: 115 %
FEV6-%Change-Post: -1 %
FEV6-%Pred-Post: 53 %
FEV6-%Pred-Pre: 54 %
FEV6-Post: 1.46 L
FEV6-Pre: 1.48 L
FEV6FVC-%Pred-Post: 103 %
FEV6FVC-%Pred-Pre: 103 %
FVC-%Change-Post: -1 %
FVC-%Pred-Post: 51 %
FVC-%Pred-Pre: 52 %
FVC-Post: 1.46 L
Post FEV1/FVC ratio: 87 %
Post FEV6/FVC ratio: 100 %
Pre FEV1/FVC ratio: 90 %
Pre FEV6/FVC Ratio: 100 %
RV % pred: 50 %
RV: 0.91 L
TLC % pred: 62 %
TLC: 2.79 L

## 2022-11-24 ENCOUNTER — Other Ambulatory Visit: Payer: Self-pay | Admitting: Family Medicine

## 2022-11-24 DIAGNOSIS — E559 Vitamin D deficiency, unspecified: Secondary | ICD-10-CM

## 2022-11-24 LAB — CMP14+EGFR
ALT: 13 [IU]/L (ref 0–32)
AST: 13 [IU]/L (ref 0–40)
Albumin: 3.9 g/dL (ref 3.9–4.9)
Alkaline Phosphatase: 69 [IU]/L (ref 44–121)
BUN/Creatinine Ratio: 15 (ref 12–28)
BUN: 12 mg/dL (ref 8–27)
Bilirubin Total: 0.3 mg/dL (ref 0.0–1.2)
CO2: 24 mmol/L (ref 20–29)
Calcium: 9.5 mg/dL (ref 8.7–10.3)
Chloride: 103 mmol/L (ref 96–106)
Creatinine, Ser: 0.8 mg/dL (ref 0.57–1.00)
Globulin, Total: 2.6 g/dL (ref 1.5–4.5)
Glucose: 103 mg/dL — ABNORMAL HIGH (ref 70–99)
Potassium: 4.1 mmol/L (ref 3.5–5.2)
Sodium: 141 mmol/L (ref 134–144)
Total Protein: 6.5 g/dL (ref 6.0–8.5)
eGFR: 84 mL/min/{1.73_m2} (ref >59)

## 2022-11-24 LAB — TSH+FREE T4
Free T4: 1.18 ng/dL (ref 0.82–1.77)
TSH: 1.97 u[IU]/mL (ref 0.450–4.500)

## 2022-11-24 LAB — CBC WITH DIFFERENTIAL/PLATELET
Basophils Absolute: 0 10*3/uL (ref 0.0–0.2)
Basos: 0 %
EOS (ABSOLUTE): 0.1 10*3/uL (ref 0.0–0.4)
Eos: 1 %
Hematocrit: 37.4 % (ref 34.0–46.6)
Hemoglobin: 12 g/dL (ref 11.1–15.9)
Immature Grans (Abs): 0 10*3/uL (ref 0.0–0.1)
Immature Granulocytes: 0 %
Lymphocytes Absolute: 2.2 10*3/uL (ref 0.7–3.1)
Lymphs: 22 %
MCH: 30.5 pg (ref 26.6–33.0)
MCHC: 32.1 g/dL (ref 31.5–35.7)
MCV: 95 fL (ref 79–97)
Monocytes Absolute: 0.3 10*3/uL (ref 0.1–0.9)
Monocytes: 4 %
Neutrophils Absolute: 7.2 10*3/uL — ABNORMAL HIGH (ref 1.4–7.0)
Neutrophils: 73 %
Platelets: 233 10*3/uL (ref 150–450)
RBC: 3.94 x10E6/uL (ref 3.77–5.28)
RDW: 13.7 % (ref 11.7–15.4)
WBC: 9.8 10*3/uL (ref 3.4–10.8)

## 2022-11-24 LAB — LIPID PANEL
Chol/HDL Ratio: 3.4 {ratio} (ref 0.0–4.4)
Cholesterol, Total: 230 mg/dL — ABNORMAL HIGH (ref 100–199)
HDL: 68 mg/dL (ref >39)
LDL Chol Calc (NIH): 147 mg/dL — ABNORMAL HIGH (ref 0–99)
Triglycerides: 86 mg/dL (ref 0–149)
VLDL Cholesterol Cal: 15 mg/dL (ref 5–40)

## 2022-11-24 LAB — VITAMIN D 25 HYDROXY (VIT D DEFICIENCY, FRACTURES): Vit D, 25-Hydroxy: 23.6 ng/mL — ABNORMAL LOW (ref 30.0–100.0)

## 2022-11-24 LAB — HEMOGLOBIN A1C
Est. average glucose Bld gHb Est-mCnc: 123 mg/dL
Hgb A1c MFr Bld: 5.9 % — ABNORMAL HIGH (ref 4.8–5.6)

## 2022-11-24 MED ORDER — VITAMIN D (ERGOCALCIFEROL) 1.25 MG (50000 UNIT) PO CAPS
50000.0000 [IU] | ORAL_CAPSULE | ORAL | 1 refills | Status: DC
Start: 2022-11-24 — End: 2023-09-20

## 2022-11-24 NOTE — Progress Notes (Signed)
Please inform the patient that her labs indicate she is prediabetic. I recommend decreasing her intake of high sugar foods and beverages, along with increasing physical activity. A prescription for a weekly vitamin D supplement has been sent to her pharmacy, as her vitamin D is slightly low. Her cholesterol levels have improved, and I recommend that she continue her lifestyle changes by reducing intake of greasy, fatty, and starchy foods with increased physical activity. All other labs are stable.

## 2022-11-26 ENCOUNTER — Telehealth: Payer: Self-pay | Admitting: Pharmacist

## 2022-11-26 MED ORDER — PREDNISONE 10 MG PO TABS: 20.0000 mg | ORAL_TABLET | Freq: Every day | ORAL | 0 refills | Status: DC

## 2022-11-26 NOTE — Telephone Encounter (Signed)
Patient called requesting prednisone refill.  Has not heard from infusion center regarding infliximab referral. Routing to [facility], pt advocate, for update if any.  Chesley Mires, PharmD, MPH, BCPS, CPP Clinical Pharmacist (Rheumatology and Pulmonology)

## 2022-11-29 NOTE — Telephone Encounter (Signed)
Good morning Devki, I am working on Industrial/product designer for [employer] portal. Unfortunately, the portal is the only way I can submit her auth. I will keep you updated.

## 2022-12-01 NOTE — Telephone Encounter (Signed)
Good morning, this was denied due to medical necessity. The insurance company could not give me any information over the phone and says that it will all be explained in a letter that was sent out.

## 2022-12-06 ENCOUNTER — Encounter (HOSPITAL_BASED_OUTPATIENT_CLINIC_OR_DEPARTMENT_OTHER): Payer: Self-pay | Admitting: Pulmonary Disease

## 2022-12-06 NOTE — Telephone Encounter (Signed)
Called patient to advise of denial and that we are awaiting denial letter. Recommended she call me if she receives denial letter at home because the letter will have reasoning for denial and next steps for appeal  Chesley Mires, PharmD, MPH, BCPS, CPP Clinical Pharmacist (Rheumatology and Pulmonology)

## 2022-12-06 NOTE — Telephone Encounter (Signed)
Patient is calling wondering why she hasn't ben scheduled yet. She saw Dr.Ellison two weeks ago.Please call and advise.

## 2022-12-23 NOTE — Telephone Encounter (Signed)
PT has the denial letter now. I asked what it says and she said it says something about safety. I will again give her your fax # but you should try to call her too if she has no fax access. Thanks.

## 2022-12-27 ENCOUNTER — Other Ambulatory Visit: Payer: Self-pay | Admitting: Pulmonary Disease

## 2022-12-27 NOTE — Telephone Encounter (Signed)
Pt calling in for a refill for her prednisone

## 2023-01-13 ENCOUNTER — Encounter: Payer: Self-pay | Admitting: Internal Medicine

## 2023-01-13 ENCOUNTER — Ambulatory Visit: Admitting: Internal Medicine

## 2023-01-13 VITALS — BP 120/77 | HR 71 | Temp 98.4°F | Ht 60.0 in | Wt 139.6 lb

## 2023-01-13 DIAGNOSIS — K219 Gastro-esophageal reflux disease without esophagitis: Secondary | ICD-10-CM | POA: Diagnosis not present

## 2023-01-13 DIAGNOSIS — R1013 Epigastric pain: Secondary | ICD-10-CM

## 2023-01-13 DIAGNOSIS — Z8619 Personal history of other infectious and parasitic diseases: Secondary | ICD-10-CM | POA: Diagnosis not present

## 2023-01-13 DIAGNOSIS — R1010 Upper abdominal pain, unspecified: Secondary | ICD-10-CM

## 2023-01-13 NOTE — Patient Instructions (Signed)
Continue on pantoprazole, you can decrease down to once daily and see how you do.  Would recommend probiotic such as align or VSL 3.  Hopefully you can eventually wean off the prednisone which may be contributing to  your symptoms.  Follow-up in 6 months or sooner if needed.  It was very nice seeing you again today.  Dr. Marletta Lor

## 2023-01-13 NOTE — Progress Notes (Signed)
Referring Provider: Gilmore Laroche, FNP Primary Care Physician:  Gilmore Laroche, FNP Primary GI:  Dr. Marletta Lor  Chief Complaint  Patient presents with   Follow-up    Pt following up on H Pylori gastritis    HPI:   Terri Newman is a 62 y.o. female who presents to the clinic today for follow-up visit.  She has a history of chronic GERD, dyspepsia, H. pylori gastritis initially diagnosed in 2015 treated multiple times with negative breath test November 2022, cholecystectomy, adenomatous colon polyps.  She had resurgence of symptoms of epigastric pain.  Underwent H. pylori breath testing 01/18/2022 which was positive.  Treated with 14 days of tetracycline, metronidazole, bismuth, PPI twice daily.  Eradication testing 04/19/2022 with H. pylori breath test still positive.  Treated again with course of antibiotics.  Subsequent eradication testing negative with breath test.  Today, taking pantoprazole as needed.  States her symptoms are overall improved.  Occasionally will still have epigastric discomfort though she has been on prednisone for many months now.   Bowels moving well no issues with constipation or diarrhea.    Past Medical History:  Diagnosis Date   Adenomatous polyp of colon 08/2013   TWO, REDUNDANT LEFT COLON   Constipation by delayed colonic transit 08/23/2013   GERD (gastroesophageal reflux disease)    Phreesia 06/06/2020   H. pylori infection 09/2019   Treated with bismuth subsalicylate, tetracycline, metronidazole, and PPI twice daily x14 days.  Documented eradication November 2022 via breath test.   Helicobacter pylori gastritis 08/2013   ABO  BID FOR 10 DAYS   Neuromuscular disorder (HCC)    chronic back pain   PONV (postoperative nausea and vomiting) 11/07/2012   Sarcoidosis    Sarcoidosis     Past Surgical History:  Procedure Laterality Date   APPENDECTOMY     BIOPSY  10/19/2019   Procedure: BIOPSY;  Surgeon: Lanelle Bal, DO;  Location: [facility];  Service: Endoscopy;;   CHOLECYSTECTOMY  12/2012   [facility]   COLONOSCOPY N/A 08/29/2013   [facility]: Several simple adenomas removed, moderate sized internal hemorrhoids, slightly redundant left colon.   COLONOSCOPY N/A 05/18/2019   Procedure: COLONOSCOPY;  Surgeon: West Bali, MD;   Two 5-8 mm polyps in the proximal ascending and distal ascending colon, external hemorrhoids, tortuous left colon.  Pathology with sessile serrated polyps.  Recommended next colonoscopy in 5 years.     ESOPHAGOGASTRODUODENOSCOPY N/A 08/29/2013   [facility]: H. pylori gastritis treated with amoxicillin/Biaxin/omeprazole   ESOPHAGOGASTRODUODENOSCOPY (EGD) WITH PROPOFOL N/A 10/19/2019   Surgeon: Lanelle Bal, DO; H. pylori gastritis, benign duodenal biopsies.   NECK SURGERY     POLYPECTOMY  05/18/2019   Procedure: POLYPECTOMY;  Surgeon: West Bali, MD;  Location: [facility];  Service: Endoscopy;;  ascending x2   TUBAL LIGATION      Current Outpatient Medications  Medication Sig Dispense Refill   albuterol (VENTOLIN HFA) 108 (90 Base) MCG/ACT inhaler Inhale 2 puffs into the lungs every 6 (six) hours as needed for wheezing or shortness of breath. 8 g 2   fluticasone-salmeterol (ADVAIR HFA) 115-21 MCG/ACT inhaler Inhale 2 puffs into the lungs 2 (two) times daily. 1 each 12   hydrOXYzine (VISTARIL) 25 MG capsule Take 1 capsule (25 mg total) by mouth every 8 (eight) hours as needed for anxiety. 30 capsule 0   pantoprazole (PROTONIX) 40 MG tablet Take 1 tablet (40 mg total) by mouth 2 (two) times daily. TAKE 1 TABLET(40 MG) BY  MOUTH TWICE DAILY 60 tablet 11   predniSONE (DELTASONE) 10 MG tablet TAKE 2 TABLETS(20 MG) BY MOUTH DAILY WITH BREAKFAST 60 tablet 0   Vitamin D, Ergocalciferol, (DRISDOL) 1.25 MG (50000 UNIT) CAPS capsule Take 1 capsule (50,000 Units total) by mouth every 7 (seven) days. 20 capsule 1   No current facility-administered medications for this visit.    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, pasta sauce, salsa, and other food produces   Occupation: Insurance account manager  Tobacco Use   Smoking status: Never    Passive exposure: Never   Smokeless tobacco: Never  Vaping Use   Vaping status: Never Used  Substance and Sexual Activity   Alcohol use: No   Drug use: No   Sexual activity: Not Currently  Other Topics Concern   Not on file  Social History Narrative   Not on file   Social Drivers of Health   Financial Resource Strain: Low Risk  (11/16/2022)   Overall Financial Resource Strain (CARDIA)    Difficulty of Paying Living Expenses: Not hard at all  Food Insecurity: No Food Insecurity (11/16/2022)   Hunger Vital Sign    Worried About Running Out of Food in the Last Year: Never true    Ran Out of Food in the Last Year: Never true  Transportation Needs: No Transportation Needs (11/16/2022)   PRAPARE - Administrator, Civil Service (Medical): No    Lack of Transportation (Non-Medical): No  Physical Activity: Unknown (11/16/2022)   Exercise Vital Sign    Days of Exercise per Week: 0 days    Minutes of Exercise per Session: Not on file  Stress: Stress Concern Present (11/16/2022)   [employer] of Occupational Health - Occupational Stress Questionnaire    Feeling of Stress : To some extent  Social Connections: Socially Integrated (11/16/2022)   Social Connection and Isolation Panel [NHANES]    Frequency of Communication with Friends and Family: More than three times a week     Frequency of Social Gatherings with Friends and Family: More than three times a week    Attends Religious Services: More than 4 times per year    Active Member of [employer] or Organizations: Yes    Attends Banker Meetings: 1 to 4 times per year    Marital Status: Married    Subjective: Review of Systems  Constitutional:  Negative for chills and fever.  HENT:  Negative for congestion and hearing loss.   Eyes:  Negative for blurred vision and double vision.  Respiratory:  Negative for cough and shortness of breath.   Cardiovascular:  Negative for chest pain and palpitations.  Gastrointestinal:  Positive for abdominal pain and heartburn. Negative for blood in stool, constipation, diarrhea, melena and vomiting.  Genitourinary:  Negative for dysuria and urgency.  Musculoskeletal:  Negative for joint pain and myalgias.  Skin:  Negative for itching  and rash.  Neurological:  Negative for dizziness and headaches.  Psychiatric/Behavioral:  Negative for depression. The patient is not nervous/anxious.      Objective: BP 120/77   Pulse 71   Temp 98.4 F (36.9 C)   Ht 5' (1.524 m)   Wt 139 lb 9.6 oz (63.3 kg)   BMI 27.26 kg/m  Physical Exam Constitutional:      Appearance: Normal appearance.  HENT:     Head: Normocephalic and atraumatic.  Eyes:     Extraocular Movements: Extraocular movements intact.     Conjunctiva/sclera: Conjunctivae normal.  Cardiovascular:     Rate and Rhythm: Normal rate and regular rhythm.  Pulmonary:     Effort: Pulmonary effort is normal.     Breath sounds: Normal breath sounds.  Abdominal:     General: Bowel sounds are normal.     Palpations: Abdomen is soft.  Musculoskeletal:        General: No swelling. Normal range of motion.     Cervical back: Normal range of motion and neck supple.  Skin:    General: Skin is warm and dry.     Coloration: Skin is not jaundiced.  Neurological:     General: No focal deficit present.     Mental Status:  She is alert and oriented to person, place, and time.  Psychiatric:        Mood and Affect: Mood normal.        Behavior: Behavior normal.      Assessment: *History of H. pylori gastritis *Epigastric pain *Chronic GERD *Dyspepsia  Plan: Patient overall appears to be improved from a GI standpoint.  Continue on pantoprazole.  Hopefully can wean off prednisone in the future which may be contributing to some of her epigastric discomfort.  Recommend probiotic.  Follow-up in 6 months or sooner if needed  01/13/2023 2:59 PM   Disclaimer: This note was dictated with voice recognition software. Similar sounding words can inadvertently be transcribed and may not be corrected upon review.

## 2023-01-14 ENCOUNTER — Ambulatory Visit (HOSPITAL_BASED_OUTPATIENT_CLINIC_OR_DEPARTMENT_OTHER)
Admission: RE | Admit: 2023-01-14 | Discharge: 2023-01-14 | Disposition: A | Discharge status: Home / Self Care | Source: Ambulatory Visit | Attending: Pulmonary Disease | Admitting: Pulmonary Disease

## 2023-01-14 DIAGNOSIS — D869 Sarcoidosis, unspecified: Secondary | ICD-10-CM | POA: Insufficient documentation

## 2023-01-25 NOTE — Telephone Encounter (Signed)
Received denial letter from Dr. Everardo All via fax (in [place]). Will work on appeal

## 2023-01-27 NOTE — Telephone Encounter (Signed)
Submitted an URGENT appeal to [employer] for  INFLIXIMAB .  Case # [id number] Email: Larita FifeCOM Fax: [phone]  Chesley Mires, PharmD, MPH, BCPS, CPP Clinical Pharmacist (Rheumatology and Pulmonology)

## 2023-01-29 MED ORDER — PREDNISONE 10 MG PO TABS: 20.0000 mg | ORAL_TABLET | Freq: Every day | ORAL | 1 refills | Status: DC

## 2023-01-29 NOTE — Addendum Note (Signed)
Addended by: Luciano Cutter on: 01/29/2023 01:05 PM   Modules accepted: Orders

## 2023-01-30 ENCOUNTER — Other Ambulatory Visit: Payer: Self-pay | Admitting: Internal Medicine

## 2023-01-30 DIAGNOSIS — K219 Gastro-esophageal reflux disease without esophagitis: Secondary | ICD-10-CM

## 2023-02-14 NOTE — Telephone Encounter (Signed)
 Patient returned call. She'd like for pharmacy team to sign AOR form on her behalf  Re-submitted an URGENT appeal to [employer] for  INFLIXIMAB .   Case # [id number] Email: Tobin QUAYCOM Fax: [phone]   Sherry Pennant, PharmD, MPH, BCPS, CPP Clinical Pharmacist (Rheumatology and Pulmonology)

## 2023-02-14 NOTE — Telephone Encounter (Signed)
 Received email from [facility] that they cannot process appeal until AOR (authorization of representation form) is received. ATC patient. Left VM - emailed form to email on file  Sherry Pennant, PharmD, MPH, BCPS, CPP Clinical Pharmacist (Rheumatology and Pulmonology)

## 2023-02-14 NOTE — Telephone Encounter (Signed)
 Received email from [facility] that they cannot process appeal until AOR (authorization of representation form) is received. ATC patient. Left VM - emailed form to email on file. Will await followup  Sherry Pennant, PharmD, MPH, BCPS, CPP Clinical Pharmacist (Rheumatology and Pulmonology)

## 2023-02-25 ENCOUNTER — Other Ambulatory Visit (HOSPITAL_BASED_OUTPATIENT_CLINIC_OR_DEPARTMENT_OTHER): Payer: Self-pay

## 2023-02-25 MED ORDER — PREDNISONE 10 MG PO TABS: 20.0000 mg | ORAL_TABLET | Freq: Every day | ORAL | 1 refills | Status: DC

## 2023-02-28 ENCOUNTER — Encounter (HOSPITAL_BASED_OUTPATIENT_CLINIC_OR_DEPARTMENT_OTHER): Payer: Self-pay | Admitting: Pulmonary Disease

## 2023-02-28 ENCOUNTER — Telehealth: Payer: Self-pay | Admitting: Pulmonary Disease

## 2023-02-28 ENCOUNTER — Telehealth (HOSPITAL_BASED_OUTPATIENT_CLINIC_OR_DEPARTMENT_OTHER): Admitting: Pulmonary Disease

## 2023-02-28 DIAGNOSIS — D860 Sarcoidosis of lung: Secondary | ICD-10-CM

## 2023-02-28 DIAGNOSIS — I3139 Other pericardial effusion (noninflammatory): Secondary | ICD-10-CM | POA: Diagnosis not present

## 2023-02-28 DIAGNOSIS — R053 Chronic cough: Secondary | ICD-10-CM

## 2023-02-28 DIAGNOSIS — D869 Sarcoidosis, unspecified: Secondary | ICD-10-CM

## 2023-02-28 MED ORDER — MYCOPHENOLATE MOFETIL 500 MG PO TABS: ORAL_TABLET | ORAL | 0 refills | Status: AC

## 2023-02-28 NOTE — Progress Notes (Signed)
Virtual Visit via Video Note  I connected with Terri Newman on 02/28/23 at  4:00 PM EST by a video enabled telemedicine application and verified that I am speaking with the correct person using two identifiers.  Location: Patient: Workplace Provider: [facility]   I discussed the limitations of evaluation and management by telemedicine and the availability of in person appointments. The patient expressed understanding and agreed to proceed.   I discussed the assessment and treatment plan with the patient. The patient was provided an opportunity to ask questions and all were answered. The patient agreed with the plan and demonstrated an understanding of the instructions.   The patient was advised to call back or seek an in-person evaluation if the symptoms worsen or if the condition fails to improve as anticipated.    Subjective:   PATIENT ID: Terri Newman GENDER: female DOB: Oct 19, 1960, MRN: [id number]   HPI  Chief Complaint  Patient presents with   Follow-up    Reason for Visit: Follow-up sarcoid  [name] is a 63 year old female with sarcoidosis, GERD, hx H. Pylori who presents for follow-up.  01/09/21 She was previously seen by Dr. Sherene Sires since 01/2020 for sarcoid management. Was followed by Juanetta Gosling before this. She was diagnosed with sarcoidosis in 2013 via mediastinoscopy at [facility]. She was treated with prednisone for 8 months with no recurrence off steroids. Cough was her primary symptoms with sarcoid flare. She reports fatigue but still able to work (lift boxes, walking frequently)  2022 - chronic nonproductive cough for last five months. GI work-up was negative including neg H.pylori. Recent cardiac work-up also neg. Cough does not interfere with her activities. She reports fatigue but attributes this to sleeping five hours a night. 2023 - Prednisone 10 mg x 3 months. Coughing has improved but not resolved.Increased to prednisone 20 mg. Has  tolerated well except for weight gain (12lb). Weaned back to prednisone 10 mg daily. Tolerating well. Has some shortness of breath that requires albuterol once a month. Weaned off steroids in December 2024 - Started on Advair with improvement in cough. Fatigue has worsened  07/12/22 Since our last visit she has been started on Advair. She reports cough occurs every once a while compared to daily before. Not needing rescue inhaler. No shortness of breath or wheezing. Some fatigue that is unchanged.  08/17/22 Since our last visit she reports right sided back pain that will intermittently occur. She had [facility] at [facility]. Not faxed yet or available in EMR. Had some mild nausea and mouth sores that self resolved. Her level of fatigue is unchanged on or off methotrexate  09/09/22 Since our last visit, back pain has resolved. Has been off methorexate for the last four weeks. Continues to have fatigue  09/28/22 Since our last visit she is tolerating methotrexate 10 mg weekly. No oral ulcers. Unchanged fatigue and shortness of breath.  10/26/22 Since our last visit she reports feeling more fatigued with the methotrexate and coughing has worsened. Started having mouth sores after starting methotrexate 20 mg weekly that caused mouth pain/dryness. Also noticed skin sensitivity around her eyes. In general skin itches and also had some nausea. Mouth sores have healed but her coughing is noticeably worse.  11/15/22 She remains on prednisone 20 mg daily. Reports sore throat. Denies fever, chills. Coughing and fatigue has improved after restarting steroids.  02/28/23 Since our last visit she remains on prednisone 20 mg daily. Insurance has denied her appeal for infliximab.  She reports daily cough. Fatigue still present but improved on steroids.  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 pylori infection 09/2019   Treated with bismuth subsalicylate, tetracycline, metronidazole, and PPI twice daily x14 days.  Documented eradication November 2022 via breath test.   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 8 g 2   fluticasone-salmeterol (ADVAIR HFA) 115-21 MCG/ACT inhaler Inhale 2 puffs into the lungs 2 (two) times daily. 1 each 12   hydrOXYzine (VISTARIL) 25 MG capsule Take 1 capsule (25 mg total) by mouth every 8 (eight) hours as needed for anxiety. 30 capsule 0   pantoprazole (PROTONIX) 40 MG tablet TAKE 1 TABLET(40 MG) BY MOUTH TWICE DAILY 60 tablet 11   predniSONE (DELTASONE) 10 MG tablet Take 2 tablets (20 mg total) by mouth daily with breakfast. 60 tablet 1   Vitamin D, Ergocalciferol, (DRISDOL) 1.25  MG (50000 UNIT) CAPS capsule Take 1 capsule (50,000 Units total) by mouth every 7 (seven) days. 20 capsule 1   No facility-administered medications prior to visit.    Review of Systems  Constitutional:  Positive for malaise/fatigue. Negative for chills, diaphoresis, fever and weight loss.  HENT:  Negative for congestion.   Respiratory:  Positive for cough. Negative for hemoptysis, sputum production, shortness of breath and wheezing.   Cardiovascular:  Negative for chest pain, palpitations and leg swelling.     Objective:   There were no vitals filed for this visit.      Physical Exam: General: Well-appearing, no acute distress HENT: [place], AT Eyes: EOMI, no scleral icterus Respiratory: No respiratory distress Neuro: AAO x4, CNII-XII grossly intact Psych: Normal mood, normal affect    Data Reviewed:  Imaging: CXR 10/15/20 - Normal. No adenopathy. No infiltrate effusion or edema PET/CT 03/10/21 - Normal cardiac perfusion and function. Hypermetabolic activity in mediastinal adenopathy and bilateral lower lobe opacities. No pleural effusions. PET/CT 12/03/21 - Low-level avidity, nonspecific. No hypermetabolic activity in adenopathy. Interval visualization of mild pulmonary fibrosis with apical to basal gradient  with associated ground glass and some traction bronchiectasis. No honeycombing. CT Chest 06/17/22 - Mild worsened fibrosis. Subpleural GGO and reticulation bilaterally with moderate traction bronchiectasis CT Chest 01/14/23 - Subpleural reticulations and ground glass fibrosis and traction bronchiectasis L>R, in interval progression compared to May 2024  PFT: 01/21/14 FVC 2.65 (84%) FEV1 2.36 (95%) Ratio 86  TLC 92% DLCO 82% Interpretation: Normal spirometry  02/10/21 FVC 1.72 (60%) FEV1 1.55 (70%) Ratio 88 TLC 76% DLCO 54% Interpretation: Mild restrictive lung defect with moderately reduced diffusing capacity. No significant bronchodilator response. Small air disease present.    11/15/22 FVC 1.46 (51%) FEV1 1.27 (58%) Ratio 87  TLC 62% DLCO 67% Interpretation: Moderately severe restrictive defect with mildly reduced diffusing capacity.   Labs:    Latest Ref Rng & Units 11/23/2022    8:03 AM 10/25/2022    4:10 PM 10/08/2022    4:14 PM  CBC  WBC 3.4 - 10.8 x10E3/uL 9.8  7.4  6.9   Hemoglobin 11.1 - 15.9 g/dL 16.1  09.6  04.5   Hematocrit 34.0 - 46.6 % 37.4  35.6  35.9   Platelets 150 - 450 x10E3/uL 233  242  230         Latest Ref Rng & Units 11/23/2022    8:03 AM 10/25/2022    4:07 PM 10/08/2022    4:14 PM  CMP  Glucose 70 - 99 mg/dL 409  88  82   BUN 8 - 27 mg/dL 12  12  12    Creatinine 0.57 - 1.00 mg/dL 8.11  9.14  7.82   Sodium 134 - 144 mmol/L 141  139  139   Potassium 3.5 - 5.2 mmol/L 4.1  4.1  4.1   Chloride 96 - 106 mmol/L 103  103  102   CO2 20 - 29 mmol/L 24  26  25    Calcium 8.7 - 10.3 mg/dL 9.5  9.2  9.4   Total Protein 6.0 - 8.5 g/dL 6.5  6.7  7.3   Total Bilirubin 0.0 - 1.2 mg/dL 0.3  <9.5  <6.2   Alkaline Phos 44 - 121 IU/L 69  82  92   AST 0 - 40 IU/L 13  26  18    ALT 0 - 32 IU/L 13  22  11      EKG: 01/02/21 NSR  Assessment & Plan:   Discussion: 63 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 Failed methotrexate due to skin sensitivity, mouth sores and worsening cough. Stopped methotrexate and resumed on prednisone 10/31/22. Remains symptomatic despite prednisone. Prior CT stable on prednisone 20 mg dosing. Insurance denied infliximab coverage for active sarcoid flare. Discussed risks and benefits of cellcept for alternative treatment including acute inflammatory syndrome (fever arthralgias, myalgias), bone marrow suppression and GI symptoms.  We discussed the clinical course of sarcoid and management including serial PFTs, labs, eye exam, and EKG and chest imaging if indicated.    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 Hx of side effects (agitation, sleep, weight gain) - Annual PFTs. Last PFTs 02/2021 - Recommend annual ophthalmology exam.  Last visit unknown - EKG  reviewed. No evidence of conduction abnormalities.  - PET/CT 03/12/21 with active pulmonary sarcoid - PET/CT 12/03/21 - low-level avidity. Improved flare. 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 Rinse mouth out after use to prevent thrush --CONTINUE Albuterol AS NEEDED   Small pericardial effusion on CT --Echocardiogram ordered  Health Maintenance Immunization History  Administered Date(s) Administered   Influenza Inj Mdck Quad Pf 11/22/2020   Influenza Split 12/04/2019   Influenza, Seasonal, Injecte, Preservative Fre 02/20/2016   Influenza,inj,Quad PF,6+ Mos 12/08/2016, 11/08/2018   Influenza,inj,quad, With Preservative 11/28/2017   Influenza-Unspecified 01/16/2014, 12/03/2014, 12/03/2019, 11/22/2020   Pneumococcal Polysaccharide-23 02/27/2013   Tdap 04/15/2021, 06/01/2021   CT Lung Screen - never smoker  Orders Placed This Encounter  Procedures   ECHOCARDIOGRAM COMPLETE    Standing Status:   Future    Expiration Date:   02/28/2024    Where should this test be performed:   [facility]    Perflutren DEFINITY (image enhancing agent) should be administered unless  hypersensitivity or allergy exist:   Administer Perflutren    Reason for exam-Echo:   Pericardial effusion I31.3   Meds ordered this encounter  Medications   mycophenolate (CELLCEPT) 500 MG tablet    Sig: Take 1 tablet (500 mg total) by mouth 2 (two) times daily for 14 days, THEN 2 tablets (1,000 mg total) 2 (two) times daily for 14 days. Will need labs every 2 weeks.    Dispense:  84 tablet    Refill:  0   Return for follow-up already scheduled for Feb 2025.   I have spent a total time of 40-minutes on the day of the appointment including chart review, data review, collecting history, coordinating care and discussing medical diagnosis and plan with the patient/family. Past medical history, allergies, medications were reviewed. Pertinent imaging, labs and tests included in this note have been reviewed and interpreted independently by me.  Oluwasemilore Bahl Mechele Collin, MD [facility] 02/28/2023 4:43 PM

## 2023-02-28 NOTE — Telephone Encounter (Signed)
Will schedule follow-up to discuss alternative immunosuppression options.

## 2023-02-28 NOTE — Patient Instructions (Signed)
--  START cellcept 03/01/23 500 mg BID x 2 weeks, then 1000 mg BID x 2 weeks --DECREASE prednisone to 10 mg once you are on 1000 mg dosing

## 2023-02-28 NOTE — Telephone Encounter (Signed)
Returned call to patient - patient states she has not yet received confirmation from insurance with INFLIXIMAB appeal determination. Denial UPHELD for same reason (was reviewed by second physician reviewer). Denial letter will be getting mailed out. Next step would be a 60-day appeal with [employer] which will be outlined in letter.  This determination was made on 02/22/2023  Phone: [phone] Call ref # [id number] 02/28/2023  Chesley Mires, PharmD, MPH, BCPS, CPP Clinical Pharmacist (Rheumatology and Pulmonology)

## 2023-02-28 NOTE — Telephone Encounter (Signed)
PT calling about when she should start the following infusion:  Therapy plan placed for Remicade (W0981) for [facility] to start benefits investigation. The first request was denied and Dr. Everardo All resubmitted.   [phone]

## 2023-03-03 ENCOUNTER — Telehealth: Payer: Self-pay | Admitting: Pharmacist

## 2023-03-03 DIAGNOSIS — Z2989 Encounter for other specified prophylactic measures: Secondary | ICD-10-CM

## 2023-03-03 MED ORDER — SULFAMETHOXAZOLE-TRIMETHOPRIM 800-160 MG PO TABS: 1.0000 | ORAL_TABLET | ORAL | 0 refills | Status: DC

## 2023-03-03 NOTE — Telephone Encounter (Signed)
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 Advised that as long as she increases her Cellcept dose to 1000mg  twice daily, she'll need Bactrim to be taken. She verbalized understanding.  Rx sent in today.   Chesley Mires, PharmD, MPH, BCPS, CPP Clinical Pharmacist (Rheumatology and Pulmonology)

## 2023-03-16 ENCOUNTER — Telehealth (HOSPITAL_BASED_OUTPATIENT_CLINIC_OR_DEPARTMENT_OTHER): Payer: Self-pay | Admitting: Pulmonary Disease

## 2023-03-16 NOTE — Telephone Encounter (Signed)
Patient calling in regarding mycophenolate: Take 1 tablet (500 mg total) by mouth 2 (two) times daily for 14 days, THEN 2 tablets (1,000 mg total) 2 (two) times daily for 14 days. Will need labs every 2 weeks.   Patient wants to verify this is correct dosage since 2000 per day seems like a lot? patient is getting ready to start with 1000 2x per day.   Patient is going [facility] Friday in [place] off of [address]. Patient almost always has issues with them seeing the lab orders. Advised patient I do see future orders in place.

## 2023-03-16 NOTE — Telephone Encounter (Signed)
Read AVS to patient to explain med directions we also confirmed her prednisone decrease. She also wanted to confirm she could take a flu shot while on Cell Cept. I spoke with Dr Vassie Loll he says patient is okay to take a flu shot with the Cell Cept. Pt verbalized understanding.

## 2023-03-18 ENCOUNTER — Encounter (HOSPITAL_BASED_OUTPATIENT_CLINIC_OR_DEPARTMENT_OTHER): Payer: Self-pay | Admitting: Pulmonary Disease

## 2023-03-18 DIAGNOSIS — D869 Sarcoidosis, unspecified: Secondary | ICD-10-CM

## 2023-03-19 LAB — CBC WITH DIFFERENTIAL/PLATELET
Basophils Absolute: 0 10*3/uL (ref 0.0–0.2)
Basos: 0 %
EOS (ABSOLUTE): 0 10*3/uL (ref 0.0–0.4)
Eos: 0 %
Hematocrit: 40.8 % (ref 34.0–46.6)
Hemoglobin: 13.2 g/dL (ref 11.1–15.9)
Immature Grans (Abs): 0.1 10*3/uL (ref 0.0–0.1)
Immature Granulocytes: 1 %
Lymphocytes Absolute: 1.7 10*3/uL (ref 0.7–3.1)
Lymphs: 13 %
MCH: 30.8 pg (ref 26.6–33.0)
MCHC: 32.4 g/dL (ref 31.5–35.7)
MCV: 95 fL (ref 79–97)
Monocytes Absolute: 0.8 10*3/uL (ref 0.1–0.9)
Monocytes: 6 %
Neutrophils Absolute: 10.8 10*3/uL — ABNORMAL HIGH (ref 1.4–7.0)
Neutrophils: 80 %
Platelets: 320 10*3/uL (ref 150–450)
RBC: 4.29 x10E6/uL (ref 3.77–5.28)
RDW: 11.9 % (ref 11.7–15.4)
WBC: 13.4 10*3/uL — ABNORMAL HIGH (ref 3.4–10.8)

## 2023-03-19 LAB — COMPREHENSIVE METABOLIC PANEL
ALT: 18 [IU]/L (ref 0–32)
AST: 15 [IU]/L (ref 0–40)
Albumin: 4.1 g/dL (ref 3.9–4.9)
Alkaline Phosphatase: 85 [IU]/L (ref 44–121)
BUN/Creatinine Ratio: 15 (ref 12–28)
BUN: 12 mg/dL (ref 8–27)
Bilirubin Total: <0.2 mg/dL (ref 0.0–1.2)
CO2: 26 mmol/L (ref 20–29)
Calcium: 9.6 mg/dL (ref 8.7–10.3)
Chloride: 103 mmol/L (ref 96–106)
Creatinine, Ser: 0.79 mg/dL (ref 0.57–1.00)
Globulin, Total: 2.5 g/dL (ref 1.5–4.5)
Glucose: 114 mg/dL — ABNORMAL HIGH (ref 70–99)
Potassium: 5.1 mmol/L (ref 3.5–5.2)
Sodium: 140 mmol/L (ref 134–144)
Total Protein: 6.6 g/dL (ref 6.0–8.5)
eGFR: 85 mL/min/{1.73_m2} (ref >59)

## 2023-03-21 ENCOUNTER — Encounter: Payer: Self-pay | Admitting: Family Medicine

## 2023-03-21 ENCOUNTER — Encounter (HOSPITAL_BASED_OUTPATIENT_CLINIC_OR_DEPARTMENT_OTHER): Payer: Self-pay | Admitting: Pulmonary Disease

## 2023-03-21 ENCOUNTER — Ambulatory Visit: Admitting: Family Medicine

## 2023-03-21 VITALS — BP 118/79 | HR 95 | Ht 61.0 in | Wt 146.0 lb

## 2023-03-21 DIAGNOSIS — R7301 Impaired fasting glucose: Secondary | ICD-10-CM

## 2023-03-21 DIAGNOSIS — E038 Other specified hypothyroidism: Secondary | ICD-10-CM

## 2023-03-21 DIAGNOSIS — Z23 Encounter for immunization: Secondary | ICD-10-CM

## 2023-03-21 DIAGNOSIS — E559 Vitamin D deficiency, unspecified: Secondary | ICD-10-CM | POA: Diagnosis not present

## 2023-03-21 DIAGNOSIS — F419 Anxiety disorder, unspecified: Secondary | ICD-10-CM | POA: Diagnosis not present

## 2023-03-21 DIAGNOSIS — E7849 Other hyperlipidemia: Secondary | ICD-10-CM

## 2023-03-21 MED ORDER — HYDROXYZINE PAMOATE 25 MG PO CAPS: 25.0000 mg | ORAL_CAPSULE | Freq: Three times a day (TID) | ORAL | 1 refills | Status: DC | PRN

## 2023-03-21 NOTE — Progress Notes (Unsigned)
Established Patient Office Visit  Subjective:  Patient ID: Terri Newman, female    DOB: October 06, 1960  Age: 63 y.o. MRN: [id number]  CC:  Chief Complaint  Patient presents with   Follow-up    Follow up    HPI Terri Newman is a 64 y.o. female with past medical history of *** presents for f/u of *** chronic medical conditions.  Past Medical History:  Diagnosis Date   Adenomatous polyp of colon 08/2013   TWO, REDUNDANT LEFT COLON   Constipation by delayed colonic transit 08/23/2013   GERD (gastroesophageal reflux disease)    [employer] 06/06/2020   H. pylori infection 09/2019   Treated with bismuth subsalicylate, tetracycline, metronidazole, and PPI twice daily x14 days.  Documented eradication November 2022 via breath test.   Helicobacter pylori gastritis 08/2013   ABO  BID FOR 10 DAYS   Neuromuscular disorder (HCC)    chronic back pain   PONV (postoperative nausea and vomiting) 11/07/2012   Sarcoidosis    Sarcoidosis     Past Surgical History:  Procedure Laterality Date   APPENDECTOMY     BIOPSY  10/19/2019   Procedure: BIOPSY;  Surgeon: Lanelle Bal, DO;  [facility]: [facility];  Service: Endoscopy;;   CHOLECYSTECTOMY  12/2012   [facility]   COLONOSCOPY N/A 08/29/2013   [facility]: Several simple adenomas removed, moderate sized internal hemorrhoids, slightly redundant left colon.   COLONOSCOPY N/A 05/18/2019   Procedure: COLONOSCOPY;  Surgeon: West Bali, MD;   Two 5-8 mm polyps in the proximal ascending and distal ascending colon, external hemorrhoids, tortuous left colon.  Pathology with sessile serrated polyps.  Recommended next colonoscopy in 5 years.     ESOPHAGOGASTRODUODENOSCOPY N/A 08/29/2013   [facility]: H. pylori gastritis treated with amoxicillin/Biaxin/omeprazole   ESOPHAGOGASTRODUODENOSCOPY (EGD) WITH PROPOFOL N/A 10/19/2019   Surgeon: Lanelle Bal, DO; H. pylori gastritis, benign duodenal biopsies.   NECK SURGERY     POLYPECTOMY  05/18/2019    Procedure: POLYPECTOMY;  Surgeon: West Bali, MD;  Location: [facility];  Service: Endoscopy;;  ascending x2   TUBAL LIGATION      Family History  Problem Relation Age of Onset   Heart attack Mother    Alcohol abuse Mother    Heart attack Father    Alcohol abuse Father    Colon cancer Neg Hx     Social History   Socioeconomic History   Marital status: Married    Spouse name: Not on file   Number of children: 3   Years of education: Not on file   Highest education level: GED or equivalent  Occupational History   Occupation: International aid/development worker    Comment: makes ranch dressing, pasta sauce, salsa, and other food produces   Occupation: Insurance account manager  Tobacco Use   Smoking status: Never    Passive exposure: Never   Smokeless tobacco: Never  Vaping Use   Vaping status: Never Used  Substance and Sexual Activity   Alcohol use: No   Drug use: No   Sexual activity: Not Currently  Other Topics Concern   Not on file  Social History Narrative   Not on file   Social Drivers of Health   Financial Resource Strain: Low Risk  (03/20/2023)   Overall Financial Resource Strain (CARDIA)    Difficulty of Paying Living Expenses: Not hard at all  Food Insecurity: No Food Insecurity (03/20/2023)   Hunger Vital Sign    Worried About Running Out of Food in the  Last Year: Never true    Ran Out of Food in the Last Year: Never true  Transportation Needs: No Transportation Needs (03/20/2023)   PRAPARE - Administrator, Civil Service (Medical): No    Lack of Transportation (Non-Medical): No  Physical Activity: Unknown (03/20/2023)   Exercise Vital Sign    Days of Exercise per Week: 0 days    Minutes of Exercise per Session: Not on file  Stress: Stress Concern Present (03/20/2023)   [employer] of Occupational Health - Occupational Stress Questionnaire    Feeling of Stress : To some extent  Social Connections: Socially Integrated (03/20/2023)   Social Connection and  Isolation Panel [NHANES]    Frequency of Communication with Friends and Family: More than three times a week    Frequency of Social Gatherings with Friends and Family: More than three times a week    Attends Religious Services: More than 4 times per year    Active Member of [employer] or Organizations: Yes    Attends Banker Meetings: Patient declined    Marital Status: Married  Catering manager Violence: Not At Risk (09/01/2020)   Received from [facility], [facility]   Humiliation, Afraid, Rape, and Kick questionnaire    Fear of Current or Ex-Partner: No    Emotionally Abused: No    Physically Abused: No    Sexually Abused: No    Outpatient Medications Prior to Visit  Medication Sig Dispense Refill   albuterol (VENTOLIN HFA) 108 (90 Base) MCG/ACT inhaler Inhale 2 puffs into the lungs every 6 (six) hours as needed for wheezing or shortness of breath. 8 g 2   fluticasone-salmeterol (ADVAIR HFA) 115-21 MCG/ACT inhaler Inhale 2 puffs into the lungs 2 (two) times daily. 1 each 12   hydrOXYzine (VISTARIL) 25 MG capsule Take 1 capsule (25 mg total) by mouth every 8 (eight) hours as needed for anxiety. 30 capsule 0   mycophenolate (CELLCEPT) 500 MG tablet Take 1 tablet (500 mg total) by mouth 2 (two) times daily for 14 days, THEN 2 tablets (1,000 mg total) 2 (two) times daily for 14 days. Will need labs every 2 weeks. 84 tablet 0   pantoprazole (PROTONIX) 40 MG tablet TAKE 1 TABLET(40 MG) BY MOUTH TWICE DAILY 60 tablet 11   predniSONE (DELTASONE) 10 MG tablet Take 2 tablets (20 mg total) by mouth daily with breakfast. 60 tablet 1   sulfamethoxazole-trimethoprim (BACTRIM DS) 800-160 MG tablet Take 1 tablet by mouth 3 (three) times a week. 36 tablet 0   Vitamin D, Ergocalciferol, (DRISDOL) 1.25 MG (50000 UNIT) CAPS capsule Take 1 capsule (50,000 Units total) by mouth every 7 (seven) days. (Patient not taking: Reported on 03/21/2023) 20 capsule 1   No facility-administered medications  prior to visit.    No Known Allergies  ROS Review of Systems    Objective:    Physical Exam  BP 118/79 (BP Location: Right Arm, Patient Position: Sitting, Cuff Size: Normal)   Pulse 95   Ht 5\' 1"  (1.549 m)   Wt 146 lb 0.6 oz (66.2 kg)   SpO2 94%   BMI 27.59 kg/m  Wt Readings from Last 3 Encounters:  03/21/23 146 lb 0.6 oz (66.2 kg)  01/13/23 139 lb 9.6 oz (63.3 kg)  11/17/22 129 lb 1.3 oz (58.6 kg)    Lab Results  Component Value Date   TSH 1.970 11/23/2022   Lab Results  Component Value Date   WBC 13.4 (H) 03/18/2023  HGB 13.2 03/18/2023   HCT 40.8 03/18/2023   MCV 95 03/18/2023   PLT 320 03/18/2023   Lab Results  Component Value Date   NA 140 03/18/2023   K 5.1 03/18/2023   CO2 26 03/18/2023   GLUCOSE 114 (H) 03/18/2023   BUN 12 03/18/2023   CREATININE 0.79 03/18/2023   BILITOT <0.2 03/18/2023   ALKPHOS 85 03/18/2023   AST 15 03/18/2023   ALT 18 03/18/2023   PROT 6.6 03/18/2023   ALBUMIN 4.1 03/18/2023   CALCIUM 9.6 03/18/2023   ANIONGAP 6 02/05/2019   EGFR 85 03/18/2023   GFR 67.91 06/16/2021   Lab Results  Component Value Date   CHOL 230 (H) 11/23/2022   Lab Results  Component Value Date   HDL 68 11/23/2022   Lab Results  Component Value Date   LDLCALC 147 (H) 11/23/2022   Lab Results  Component Value Date   TRIG 86 11/23/2022   Lab Results  Component Value Date   CHOLHDL 3.4 11/23/2022   Lab Results  Component Value Date   HGBA1C 5.9 (H) 11/23/2022      Assessment & Plan:  There are no diagnoses linked to this encounter.  Follow-up: No follow-ups on file.   Gilmore Laroche, FNP

## 2023-03-21 NOTE — Patient Instructions (Addendum)
I appreciate the opportunity to provide care to you today!    Follow up:  4 months  Fasting Labs: please stop by the lab during the week to get your blood drawn (TSH, Lipid profile, HgA1c, Vit D)  Nonpharmacological Interventions for Leg Cramps Leg cramps can be caused by muscle fatigue, dehydration, electrolyte imbalances, or poor circulation. The following nonpharmacological approaches can help prevent and relieve leg cramps effectively:  1. Stretching & Massage Gentle stretching of the affected muscle - Hold the stretch for 15-30 seconds to relieve the cramp. For calf cramps - Try standing and leaning forward with the heel on the ground or pulling the foot toward you while sitting.  Massage the muscle - Gently rubbing or kneading the affected area can help release tension and improve blood flow.  2. Hydration & Electrolyte Balance Drink plenty of water throughout the day to stay hydrated. Increase electrolyte intake - Potassium, magnesium, and calcium deficiencies may contribute to cramping. Include:  Bananas, oranges, avocados (potassium) Leafy greens, nuts, seeds (magnesium) Dairy products, almonds, fortified foods (calcium) 3. Heat & Cold Therapy Apply a warm compress or heating pad to relax tight muscles. Use an ice pack if soreness persists after cramping.  4. Proper Footwear & Posture Wear supportive shoes with proper arch support. Avoid standing or sitting in one position for long periods - Take breaks to move around.  5. Regular Physical Activity Gentle exercise (walking, swimming, cycling) helps improve circulation and prevent cramps.  Strengthening & flexibility exercises for the legs can reduce the frequency and intensity of cramps.  6. Sleep Positioning & Bedtime Routine Stretch before bed to prevent nighttime leg cramps. Sleep with a pillow under your knees (if sleeping on your back) or between your legs (if on your side) to support muscle relaxation.  7. Avoid  Triggers  Limit caffeine and alcohol intake, as they can contribute to dehydration and muscle cramps.  Avoid excessive exertion without proper warm-up and cool-down.  When to Seek Medical Attention  Frequent or severe cramps that disrupt sleep or daily activities. Leg swelling, redness, or warmth (possible blood clot). Cramping associated with weakness, numbness, or tingling (possible nerve issues). Cramps that do not improve despite lifestyle changes.   Here are some foods to avoid or reduce in your diet to help manage cholesterol levels:  Fried Foods:Deep-fried items such as french fries, fried chicken, and fried snacks are high in unhealthy fats and can raise LDL (bad) cholesterol levels. Processed Meats:Foods like bacon, sausage, hot dogs, and deli meats are often high in saturated fat and cholesterol. Full-Fat Dairy Products:Whole milk, full-fat yogurt, butter, cream, and cheese are rich in saturated fats, which can increase cholesterol levels. Baked Goods and Sweets:Pastries, cakes, cookies, and donuts often contain trans fats and added sugars, which can raise LDL cholesterol and lower HDL (good) cholesterol. Red Meat:Beef, lamb, and pork are high in saturated fat. Lean cuts or plant-based protein alternatives are better options. Lard and Shortening:Used in some baked goods, lard and shortening are high in trans fats and should be avoided. Fast Food:Many fast food items are cooked with unhealthy oils and contain high amounts of saturated and trans fats. Processed Snacks:Chips, crackers, and certain microwave popcorns can contain trans fats and high levels of unhealthy oils. Shellfish:While nutritious in other ways, some shellfish like shrimp, lobster, and crab are high in cholesterol. They should be consumed in moderation. Coconut and Palm Oils:these oils are high in saturated fat and can raise cholesterol levels when used in  cooking or baking.     Please continue to a heart-healthy  diet and increase your physical activities. Try to exercise for at least five days a week.    It was a pleasure to see you and I look forward to continuing to work together on your health and well-being. Please do not hesitate to call the office if you need care or have questions about your care.  In case of emergency, please visit the Emergency Department for urgent care, or contact our clinic at [phone] to schedule an appointment. We're here to help you!   Have a wonderful day and week. With Gratitude, Gilmore Laroche MSN, FNP-BC

## 2023-03-22 ENCOUNTER — Emergency Department (HOSPITAL_COMMUNITY)
Admission: EM | Admit: 2023-03-22 | Discharge: 2023-03-22 | Disposition: A | Discharge status: Home / Self Care | Attending: Emergency Medicine | Admitting: Emergency Medicine

## 2023-03-22 ENCOUNTER — Emergency Department (HOSPITAL_COMMUNITY)

## 2023-03-22 ENCOUNTER — Other Ambulatory Visit: Payer: Self-pay

## 2023-03-22 DIAGNOSIS — R1011 Right upper quadrant pain: Secondary | ICD-10-CM | POA: Diagnosis present

## 2023-03-22 DIAGNOSIS — R110 Nausea: Secondary | ICD-10-CM | POA: Diagnosis not present

## 2023-03-22 DIAGNOSIS — D72829 Elevated white blood cell count, unspecified: Secondary | ICD-10-CM | POA: Insufficient documentation

## 2023-03-22 LAB — CBC WITH DIFFERENTIAL/PLATELET
Abs Immature Granulocytes: 0.12 10*3/uL — ABNORMAL HIGH (ref 0.00–0.07)
Basophils Absolute: 0.1 10*3/uL (ref 0.0–0.1)
Basophils Relative: 0 %
Eosinophils Absolute: 0.1 10*3/uL (ref 0.0–0.5)
Eosinophils Relative: 1 %
HCT: 39.1 % (ref 36.0–46.0)
Hemoglobin: 13 g/dL (ref 12.0–15.0)
Immature Granulocytes: 1 %
Lymphocytes Relative: 24 %
Lymphs Abs: 3.5 10*3/uL (ref 0.7–4.0)
MCH: 31.9 pg (ref 26.0–34.0)
MCHC: 33.2 g/dL (ref 30.0–36.0)
MCV: 96.1 fL (ref 80.0–100.0)
Monocytes Absolute: 0.8 10*3/uL (ref 0.1–1.0)
Monocytes Relative: 6 %
Neutro Abs: 10 10*3/uL — ABNORMAL HIGH (ref 1.7–7.7)
Neutrophils Relative %: 68 %
Platelets: 250 10*3/uL (ref 150–400)
RBC: 4.07 MIL/uL (ref 3.87–5.11)
RDW: 12.5 % (ref 11.5–15.5)
WBC: 14.6 10*3/uL — ABNORMAL HIGH (ref 4.0–10.5)
nRBC: 0 % (ref 0.0–0.2)

## 2023-03-22 LAB — COMPREHENSIVE METABOLIC PANEL
ALT: 19 U/L (ref 0–44)
AST: 17 U/L (ref 15–41)
Albumin: 3.7 g/dL (ref 3.5–5.0)
Alkaline Phosphatase: 56 U/L (ref 38–126)
Anion gap: 9 (ref 5–15)
BUN: 20 mg/dL (ref 8–23)
CO2: 25 mmol/L (ref 22–32)
Calcium: 9.2 mg/dL (ref 8.9–10.3)
Chloride: 96 mmol/L — ABNORMAL LOW (ref 98–111)
Creatinine, Ser: 0.85 mg/dL (ref 0.44–1.00)
GFR, Estimated: >60 mL/min (ref >60)
Glucose, Bld: 99 mg/dL (ref 70–99)
Potassium: 4.4 mmol/L (ref 3.5–5.1)
Sodium: 130 mmol/L — ABNORMAL LOW (ref 135–145)
Total Bilirubin: 0.8 mg/dL (ref 0.0–1.2)
Total Protein: 6.8 g/dL (ref 6.5–8.1)

## 2023-03-22 LAB — URINALYSIS, ROUTINE W REFLEX MICROSCOPIC
Bilirubin Urine: NEGATIVE
Glucose, UA: NEGATIVE mg/dL
Hgb urine dipstick: NEGATIVE
Ketones, ur: NEGATIVE mg/dL
Leukocytes,Ua: NEGATIVE
Nitrite: NEGATIVE
Protein, ur: NEGATIVE mg/dL
Specific Gravity, Urine: 1.016 (ref 1.005–1.030)
pH: 5 (ref 5.0–8.0)

## 2023-03-22 MED ORDER — IOHEXOL 300 MG/ML  SOLN
100.0000 mL | Freq: Once | INTRAMUSCULAR | Status: AC | PRN
  Administered 2023-03-22: 100 mL via INTRAVENOUS

## 2023-03-22 MED ORDER — ONDANSETRON 4 MG PO TBDP: 4.0000 mg | ORAL_TABLET | Freq: Three times a day (TID) | ORAL | 0 refills | Status: DC | PRN

## 2023-03-22 MED ORDER — HYDROMORPHONE HCL 1 MG/ML IJ SOLN
0.5000 mg | Freq: Once | INTRAMUSCULAR | Status: AC
  Administered 2023-03-22: 0.5 mg via INTRAVENOUS
  Filled 2023-03-22: qty 0.5

## 2023-03-22 MED ORDER — METOCLOPRAMIDE HCL 5 MG/ML IJ SOLN: 10.0000 mg | Freq: Four times a day (QID) | INTRAMUSCULAR | Status: DC | PRN

## 2023-03-22 NOTE — ED Triage Notes (Signed)
Pt c/o "liver pain", states she has been on Mycophenolate for Sarcoidosis x3 weeks and was told it could effect her liver and wants to make sure its not.

## 2023-03-22 NOTE — ED Notes (Signed)
 Patient transported to CT

## 2023-03-22 NOTE — ED Notes (Signed)
 Pt ambulated to bathroom with steady and even gait.

## 2023-03-22 NOTE — ED Provider Notes (Signed)
[place] [facility] AT [facility] Provider Note   CSN: [id number] Arrival date & time: 03/22/23  4098     History  Chief Complaint  Patient presents with   Flank Pain    Terri Newman is a 63 y.o. female.   Flank Pain Associated symptoms include abdominal pain.  Patient presents for right upper quadrant abdominal pain.  Which includes sarcoidosis, GERD, constipation.  She started on CellCept for treatment of her sarcoidosis 3 weeks ago.  She has been undergoing routine lab work.  She had an outpatient doctor visit yesterday.  She was in her normal state of health last night.  At 3 AM, she was awakened with right upper quadrant abdominal pain.  She has had nausea and did vomit once.  Pain has subsided since onset but is still present.  It radiates to mid back.  She denies any other associated symptoms.  She has undergone cholecystectomy in the past.     Home Medications Prior to Admission medications   Medication Sig Start Date End Date Taking? Authorizing Provider  ondansetron (ZOFRAN-ODT) 4 MG disintegrating tablet Take 1 tablet (4 mg total) by mouth every 8 (eight) hours as needed. 03/22/23  Yes Gloris Manchester, MD  albuterol (VENTOLIN HFA) 108 (90 Base) MCG/ACT inhaler Inhale 2 puffs into the lungs every 6 (six) hours as needed for wheezing or shortness of breath. 01/19/22   Gilmore Laroche, FNP  fluticasone-salmeterol (ADVAIR HFA) 119-14 MCG/ACT inhaler Inhale 2 puffs into the lungs 2 (two) times daily. 02/26/22   Luciano Cutter, MD  hydrOXYzine (VISTARIL) 25 MG capsule Take 1 capsule (25 mg total) by mouth every 8 (eight) hours as needed for anxiety. 03/21/23   Gilmore Laroche, FNP  mycophenolate (CELLCEPT) 500 MG tablet Take 1 tablet (500 mg total) by mouth 2 (two) times daily for 14 days, THEN 2 tablets (1,000 mg total) 2 (two) times daily for 14 days. Will need labs every 2 weeks. 02/28/23 03/28/23  Luciano Cutter, MD  pantoprazole (PROTONIX) 40 MG  tablet TAKE 1 TABLET(40 MG) BY MOUTH TWICE DAILY 01/31/23   Lanelle Bal, DO  predniSONE (DELTASONE) 10 MG tablet Take 2 tablets (20 mg total) by mouth daily with breakfast. 02/25/23   Luciano Cutter, MD  sulfamethoxazole-trimethoprim (BACTRIM DS) 800-160 MG tablet Take 1 tablet by mouth 3 (three) times a week. 03/04/23   Luciano Cutter, MD  Vitamin D, Ergocalciferol, (DRISDOL) 1.25 MG (50000 UNIT) CAPS capsule Take 1 capsule (50,000 Units total) by mouth every 7 (seven) days. Patient not taking: Reported on 03/21/2023 11/24/22   Gilmore Laroche, FNP      Allergies    Patient has no known allergies.    Review of Systems   Review of Systems  Gastrointestinal:  Positive for abdominal pain, nausea and vomiting.  All other systems reviewed and are negative.   Physical Exam Updated Vital Signs BP (!) 109/59   Pulse 78   Temp 98.6 F (37 C) (Oral)   Resp 19   SpO2 95%  Physical Exam Vitals and nursing note reviewed.  Constitutional:      General: She is not in acute distress.    Appearance: Normal appearance. She is well-developed. She is not ill-appearing, toxic-appearing or diaphoretic.  HENT:     Head: Normocephalic and atraumatic.     Right Ear: External ear normal.     Left Ear: External ear normal.     Nose: Nose normal.     Mouth/Throat:  Mouth: Mucous membranes are moist.  Eyes:     Extraocular Movements: Extraocular movements intact.     Conjunctiva/sclera: Conjunctivae normal.  Cardiovascular:     Rate and Rhythm: Normal rate and regular rhythm.  Pulmonary:     Effort: Pulmonary effort is normal. No respiratory distress.  Abdominal:     General: There is no distension.     Palpations: Abdomen is soft.     Tenderness: There is abdominal tenderness. There is no guarding.  Musculoskeletal:        General: No swelling. Normal range of motion.     Cervical back: Normal range of motion and neck supple.  Skin:    General: Skin is warm and dry.      Coloration: Skin is not jaundiced or pale.  Neurological:     General: No focal deficit present.     Mental Status: She is alert and oriented to person, place, and time.  Psychiatric:        Mood and Affect: Mood normal.        Behavior: Behavior normal.     ED Results / Procedures / Treatments   Labs (all labs ordered are listed, but only abnormal results are displayed) Labs Reviewed  COMPREHENSIVE METABOLIC PANEL - Abnormal; Notable for the following components:      Result Value   Sodium 130 (*)    Chloride 96 (*)    All other components within normal limits  CBC WITH DIFFERENTIAL/PLATELET - Abnormal; Notable for the following components:   WBC 14.6 (*)    Neutro Abs 10.0 (*)    Abs Immature Granulocytes 0.12 (*)    All other components within normal limits  URINALYSIS, ROUTINE W REFLEX MICROSCOPIC    EKG None  Radiology US Abdomen Limited Result Date: 03/22/2023 CLINICAL DATA:  RUQ pain EXAM: ULTRASOUND ABDOMEN LIMITED RIGHT UPPER QUADRANT COMPARISON:  Same day CT.  Ultrasound dated Jun 05, 2012 FINDINGS: Gallbladder: Surgically absent. Common bile duct: Diameter: Visualized portion measures 3 mm, within normal limits. Liver: Revisualization of an echogenic mass in the RIGHT liver measuring 12 x 10 x 9 mm, similar dating back to 2014 and consistent with a benign hemangioma. Within normal limits in parenchymal echogenicity. Portal vein is patent on color Doppler imaging with normal direction of blood flow towards the liver. Other: None. IMPRESSION: No sonographic etiology for RIGHT upper quadrant pain is identified. Electronically Signed   By: Meda Klinefelter M.D.   On: 03/22/2023 12:46   CT ABDOMEN PELVIS W CONTRAST Result Date: 03/22/2023 CLINICAL DATA:  Acute abdominal pain. EXAM: CT ABDOMEN AND PELVIS WITH CONTRAST TECHNIQUE: Multidetector CT imaging of the abdomen and pelvis was performed using the standard protocol following bolus administration of intravenous contrast.  RADIATION DOSE REDUCTION: This exam was performed according to the departmental dose-optimization program which includes automated exposure control, adjustment of the mA and/or kV according to patient size and/or use of iterative reconstruction technique. CONTRAST:  OMNIPAQUE IOHEXOL 300 MG/ML  SOLN COMPARISON:  08/19/2017 FINDINGS: Lower Chest: No acute findings. Bibasilar pulmonary interstitial fibrosis, left side greater than right. Hepatobiliary: No suspicious hepatic masses identified. No CT signs of steatosis. Prior cholecystectomy. No evidence of biliary obstruction. Pancreas:  No mass or inflammatory changes. Spleen: Within normal limits in size and appearance. Adrenals/Urinary Tract: No suspicious masses identified. No evidence of ureteral calculi or hydronephrosis. Stomach/Bowel: No evidence of obstruction, inflammatory process or abnormal fluid collections. Vascular/Lymphatic: No pathologically enlarged lymph nodes. No acute vascular findings. Reproductive:  No mass or other significant abnormality. Other:  None. Musculoskeletal:  No suspicious bone lesions identified. IMPRESSION: No acute findings or other significant abnormality within the abdomen or pelvis. Bibasilar pulmonary interstitial fibrosis. Electronically Signed   By: Danae Orleans M.D.   On: 03/22/2023 11:36    Procedures Procedures    Medications Ordered in ED Medications  metoCLOPramide (REGLAN) injection 10 mg (10 mg Intravenous Patient Refused/Not Given 03/22/23 1332)  iohexol (OMNIPAQUE) 300 MG/ML solution 100 mL (100 mLs Intravenous Contrast Given 03/22/23 0912)  HYDROmorphone (DILAUDID) injection 0.5 mg (0.5 mg Intravenous Given 03/22/23 1050)    ED Course/ Medical Decision Making/ A&P                                 Medical Decision Making Amount and/or Complexity of Data Reviewed Radiology: ordered.  Risk Prescription drug management.   This patient presents to the ED for concern of right upper quadrant  pain, this involves an extensive number of treatment options, and is a complaint that carries with it a high risk of complications and morbidity.  The differential diagnosis includes cholelithiasis, cholangitis, PUD, GERD, pancreatitis, otitis, pneumonia, colitis, musculoskeletal etiology   Co morbidities that complicate the patient evaluation  sarcoidosis, GERD, constipation   Additional history obtained:  Additional history obtained from N/A External records from outside source obtained and reviewed including EMR   Lab Tests:  I Ordered, and personally interpreted labs.  The pertinent results include: Leukocytosis, similar to lab work from yesterday, normal hemoglobin, normal kidney function, normal electrolytes, normal hepatobiliary enzymes   Imaging Studies ordered:  I ordered imaging studies including CT of abdomen and pelvis, right upper quadrant ultrasound I independently visualized and interpreted imaging which showed no acute findings.  There was redemonstration of right liver mass consistent with benign hemangioma.  There was chronic finding of bibasilar pulmonary interstitial fibrosis. I agree with the radiologist interpretation   Cardiac Monitoring: / EKG:  The patient was maintained on a cardiac monitor.  I personally viewed and interpreted the cardiac monitored which showed an underlying rhythm of: Sinus rhythm  Problem List / ED Course / Critical interventions / Medication management  Patient presenting for right upper quadrant abdominal pain, nausea, and vomiting.  Onset was 3 AM this morning.  On arrival in the ED, vital signs are normal.  Patient is well-appearing on exam.  She reports that her pain is only mild at this time.  She declines any pain or nausea medicine.  She does have some right upper quadrant tenderness.  Lab work and imaging studies were ordered.  Patient's lab work is notable for a leukocytosis.  This was also present on her lab work yesterday,  before her right upper quadrant symptoms began.  She is on steroids for her sarcoidosis.  Notably, hepatobiliary enzymes are normal.  Patient underwent CT of abdomen and pelvis in addition to a right upper quadrant ultrasound.  There were no acute findings to explain her recent discomfort.  Her discomfort did improve while in the ED.  She was able to eat and drink.  She is does feel comfortable with discharge home. I ordered medication including Dilaudid for analgesia Reevaluation of the patient after these medicines showed that the patient improved I have reviewed the patients home medicines and have made adjustments as needed   Social Determinants of Health:  Has access to outpatient care        Final  Clinical Impression(s) / ED Diagnoses Final diagnoses:  Right upper quadrant abdominal pain    Rx / DC Orders ED Discharge Orders          Ordered    ondansetron (ZOFRAN-ODT) 4 MG disintegrating tablet  Every 8 hours PRN        03/22/23 1325              Gloris Manchester, MD 03/22/23 1538

## 2023-03-22 NOTE — ED Notes (Signed)
 Patient back from CT.

## 2023-03-22 NOTE — ED Notes (Signed)
 ED Provider at bedside.

## 2023-03-22 NOTE — ED Notes (Signed)
 Messaged provider about patient requesting medication for pain.

## 2023-03-22 NOTE — Discharge Instructions (Signed)
Test results today are reassuring.  A prescription for Zofran was sent to your pharmacy.  Take this as needed for nausea.  Follow-up with your outpatient providers.  Return to the emergency department for any new or worsening symptoms of concern.

## 2023-03-23 ENCOUNTER — Encounter (HOSPITAL_BASED_OUTPATIENT_CLINIC_OR_DEPARTMENT_OTHER): Payer: Self-pay | Admitting: Pulmonary Disease

## 2023-03-23 NOTE — Telephone Encounter (Signed)
Received the following email from patient:   "Had to go to er  Tuesday was having severe pain where my liver was they did all kinds of test and found nothing I took the flu shot Monday and I believe that's what made me sick I haven't been able to take my meds because I am still real nauseous  I am gonna try to take them today but I don't know if I will be able to just wanted to know if that was ok? And can you tell me how many pills I am suppose to be taking now just want to know if I am taking the right amount."  I advised her to hold off on taking the Bactrim and prednisone until she heard back from the office, especially if she is still nauseous.   Dr. Everardo All, can you please advise? Thanks!

## 2023-03-24 DIAGNOSIS — Z23 Encounter for immunization: Secondary | ICD-10-CM | POA: Insufficient documentation

## 2023-03-24 DIAGNOSIS — E559 Vitamin D deficiency, unspecified: Secondary | ICD-10-CM | POA: Insufficient documentation

## 2023-03-24 NOTE — Assessment & Plan Note (Signed)
 Encouraged to increase his intake of vitamin D-rich foods such as fatty fish (e.g., salmon, mackerel, and sardines), fortified dairy products, egg yolks, and fortified cereals.

## 2023-03-24 NOTE — Assessment & Plan Note (Signed)
Initiated hydroxyzine 25 mg every 8 hours as needed for anxiety. If excessive drowsiness occurs, the patient may take half a tablet (12.5 mg) instead. Encouraged to take hydroxyzine 25 mg 30-60 minutes before bedtime for sleep difficulties. Reviewed nonpharmacological interventions for anxiety and depression; the patient verbalized understanding.

## 2023-03-24 NOTE — Assessment & Plan Note (Signed)
 Patient educated on CDC recommendation for the vaccine. Verbal consent was obtained from the patient, vaccine administered by nurse, no sign of adverse reactions noted at this time. Patient education on arm soreness and use of tylenol or ibuprofen for this patient  was discussed. Patient educated on the signs and symptoms of adverse effect and advise to contact the office if they occur.

## 2023-03-24 NOTE — Telephone Encounter (Signed)
 FYI

## 2023-03-25 ENCOUNTER — Ambulatory Visit (HOSPITAL_COMMUNITY)
Admission: RE | Admit: 2023-03-25 | Discharge: 2023-03-25 | Disposition: A | Discharge status: Home / Self Care | Source: Ambulatory Visit | Attending: Pulmonary Disease | Admitting: Pulmonary Disease

## 2023-03-25 DIAGNOSIS — I3139 Other pericardial effusion (noninflammatory): Secondary | ICD-10-CM | POA: Insufficient documentation

## 2023-03-25 LAB — ECHOCARDIOGRAM COMPLETE
AR max vel: 2.51 cm2
AV Area VTI: 2.46 cm2
AV Area mean vel: 2.19 cm2
AV Mean grad: 3 mm[Hg]
AV Peak grad: 5.9 mm[Hg]
Ao pk vel: 1.21 m/s
Area-P 1/2: 5.16 cm2
Calc EF: 68.8 %
MV VTI: 3.46 cm2
S' Lateral: 2.2 cm
Single Plane A2C EF: 74.2 %
Single Plane A4C EF: 65.6 %

## 2023-03-25 MED ORDER — PERFLUTREN LIPID MICROSPHERE
1.0000 mL | INTRAVENOUS | Status: AC | PRN
  Administered 2023-03-25: 10 mL via INTRAVENOUS

## 2023-03-28 ENCOUNTER — Encounter (HOSPITAL_BASED_OUTPATIENT_CLINIC_OR_DEPARTMENT_OTHER): Payer: Self-pay | Admitting: Pulmonary Disease

## 2023-03-29 ENCOUNTER — Encounter (HOSPITAL_BASED_OUTPATIENT_CLINIC_OR_DEPARTMENT_OTHER): Payer: Self-pay | Admitting: Pulmonary Disease

## 2023-03-29 ENCOUNTER — Ambulatory Visit (HOSPITAL_BASED_OUTPATIENT_CLINIC_OR_DEPARTMENT_OTHER): Admitting: Pulmonary Disease

## 2023-03-29 VITALS — BP 118/78 | HR 86 | Ht 61.0 in | Wt 143.6 lb

## 2023-03-29 DIAGNOSIS — D869 Sarcoidosis, unspecified: Secondary | ICD-10-CM

## 2023-03-29 DIAGNOSIS — Z79899 Other long term (current) drug therapy: Secondary | ICD-10-CM

## 2023-03-29 MED ORDER — PREDNISONE 5 MG PO TABS: 5.0000 mg | ORAL_TABLET | Freq: Every day | ORAL | 3 refills | Status: DC

## 2023-03-29 NOTE — Patient Instructions (Addendum)
 Sarcoid --CONTINUE cellcept  500 mg BID 03/01/23 until labs return on 04/08/23  >If stable/improved WBC, then will increase cellcept 1000 mg BID. Will need new script at that time  >Discuss results on mychart --DECREASE prednisone to 10 mg to 5 mg starting 03/30/23

## 2023-03-29 NOTE — Progress Notes (Unsigned)
 Virtual Visit via Video Note  I connected with Terri Newman on 03/29/23 at  3:45 PM EST by a video enabled telemedicine application and verified that I am speaking with the correct person using two identifiers.  Location: Patient: Workplace Provider: [facility]   I discussed the limitations of evaluation and management by telemedicine and the availability of in person appointments. The patient expressed understanding and agreed to proceed.   I discussed the assessment and treatment plan with the patient. The patient was provided an opportunity to ask questions and all were answered. The patient agreed with the plan and demonstrated an understanding of the instructions.   The patient was advised to call back or seek an in-person evaluation if the symptoms worsen or if the condition fails to improve as anticipated.    Subjective:   PATIENT ID: Terri Newman GENDER: female DOB: 31-Aug-1960, MRN: [id number]   HPI  Chief Complaint  Patient presents with   Follow-up    Sarcoidosis    Reason for Visit: Follow-up sarcoid  [name] is a 63 year old female with sarcoidosis, GERD, hx H. Pylori who presents for follow-up.  01/09/21 She was previously seen by Dr. Sherene Newman since 01/2020 for sarcoid management. Was followed by Terri Newman before this. She was diagnosed with sarcoidosis in 2013 via mediastinoscopy at [facility]. She was treated with prednisone for 8 months with no recurrence off steroids. Cough was her primary symptoms with sarcoid flare. She reports fatigue but still able to work (lift boxes, walking frequently)  2022 - chronic nonproductive cough for last five months. GI work-up was negative including neg H.pylori. Recent cardiac work-up also neg. Cough does not interfere with her activities. She reports fatigue but attributes this to sleeping five hours a night. 2023 - Prednisone 10 mg x 3 months. Coughing has improved but not resolved.Increased to prednisone  20 mg. Has tolerated well except for weight gain (12lb). Weaned back to prednisone 10 mg daily. Tolerating well. Has some shortness of breath that requires albuterol once a month. Weaned off steroids in December 2024 - Started on Advair with improvement in cough. Fatigue has worsened  07/12/22 Since our last visit she has been started on Advair. She reports cough occurs every once a while compared to daily before. Not needing rescue inhaler. No shortness of breath or wheezing. Some fatigue that is unchanged.  08/17/22 Since our last visit she reports right sided back pain that will intermittently occur. She had [facility] at [facility] on [facility]. Not faxed yet or available in EMR. Had some mild nausea and mouth sores that self resolved. Her level of fatigue is unchanged on or off methotrexate  09/09/22 Since our last visit, back pain has resolved. Has been off methorexate for the last four weeks. Continues to have fatigue  09/28/22 Since our last visit she is tolerating methotrexate 10 mg weekly. No oral ulcers. Unchanged fatigue and shortness of breath.  10/26/22 Since our last visit she reports feeling more fatigued with the methotrexate and coughing has worsened. Started having mouth sores after starting methotrexate 20 mg weekly that caused mouth pain/dryness. Also noticed skin sensitivity around her eyes. In general skin itches and also had some nausea. Mouth sores have healed but her coughing is noticeably worse.  11/15/22 She remains on prednisone 20 mg daily. Reports sore throat. Denies fever, chills. Coughing and fatigue has improved after restarting steroids.  02/28/23 Since our last visit she remains on prednisone 20 mg daily. Insurance has denied  her appeal for infliximab. She reports daily cough. Fatigue still present but improved on steroids.  03/29/23 Since our last visit she was started on cellcept. She did have ED visit for abdominal pain after influenza vaccine the day prior  however negative work-up. She has been tolerating cellcept otherwise.   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 pylori infection 09/2019   Treated with bismuth subsalicylate, tetracycline, metronidazole, and PPI twice daily x14 days.  Documented eradication November 2022 via breath test.   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 8 g 2   fluticasone-salmeterol (ADVAIR HFA) 115-21 MCG/ACT inhaler Inhale 2 puffs into the lungs 2 (two) times daily. 1 each 12   hydrOXYzine (VISTARIL) 25 MG capsule Take 1 capsule (25 mg total) by mouth every 8 (eight) hours as needed for anxiety. 90 capsule 1   ondansetron (ZOFRAN-ODT)  4 MG disintegrating tablet Take 1 tablet (4 mg total) by mouth every 8 (eight) hours as needed. 20 tablet 0   pantoprazole (PROTONIX) 40 MG tablet TAKE 1 TABLET(40 MG) BY MOUTH TWICE DAILY 60 tablet 11   predniSONE (DELTASONE) 10 MG tablet Take 2 tablets (20 mg total) by mouth daily with breakfast. 60 tablet 1   sulfamethoxazole-trimethoprim (BACTRIM DS) 800-160 MG tablet Take 1 tablet by mouth 3 (three) times a week. 36 tablet 0   Vitamin D, Ergocalciferol, (DRISDOL) 1.25 MG (50000 UNIT) CAPS capsule Take 1 capsule (50,000 Units total) by mouth every 7 (seven) days. 20 capsule 1   No facility-administered medications prior to visit.    Review of Systems  Constitutional:  Positive for malaise/fatigue. Negative for chills, diaphoresis, fever and weight loss.  HENT:  Negative for congestion.   Respiratory:  Positive for cough. Negative for hemoptysis, sputum production, shortness of breath and wheezing.   Cardiovascular:  Negative for chest pain, palpitations and leg swelling.     Objective:   Vitals:   03/29/23 1535  BP: 118/78  Pulse: 86  SpO2: 95%  Weight: 143 lb 9.6  oz (65.1 kg)  Height: 5\' 1"  (1.549 m)      SpO2: 95 % Physical Exam: General: Well-appearing, no acute distress HENT: [place], AT Eyes: EOMI, no scleral icterus Respiratory: No respiratory distress Neuro: AAO x4, CNII-XII grossly intact Psych: Normal mood, normal affect    Data Reviewed:  Imaging: CXR 10/15/20 - Normal. No adenopathy. No infiltrate effusion or edema PET/CT 03/10/21 - Normal cardiac perfusion and function. Hypermetabolic activity in mediastinal adenopathy and bilateral lower lobe opacities. No pleural effusions. PET/CT 12/03/21 - Low-level avidity, nonspecific. No hypermetabolic activity in adenopathy. Interval visualization of mild pulmonary fibrosis with apical to basal gradient with associated ground glass and some traction bronchiectasis. No honeycombing. CT Chest 06/17/22 - Mild worsened  fibrosis. Subpleural GGO and reticulation bilaterally with moderate traction bronchiectasis CT Chest 01/14/23 - Subpleural reticulations and ground glass fibrosis and traction bronchiectasis L>R, in interval progression compared to May 2024  PFT: 01/21/14 FVC 2.65 (84%) FEV1 2.36 (95%) Ratio 86  TLC 92% DLCO 82% Interpretation: Normal spirometry  02/10/21 FVC 1.72 (60%) FEV1 1.55 (70%) Ratio 88 TLC 76% DLCO 54% Interpretation: Mild restrictive lung defect with moderately reduced diffusing capacity. No significant bronchodilator response. Small air disease present.   11/15/22 FVC 1.46 (51%) FEV1 1.27 (58%) Ratio 87  TLC 62% DLCO 67% Interpretation: Moderately severe restrictive defect with mildly reduced diffusing capacity.   Labs:    Latest Ref Rng & Units 03/22/2023    6:36 AM 03/18/2023    4:19 PM 11/23/2022    8:03 AM  CBC  WBC 4.0 - 10.5 K/uL 14.6  13.4  9.8   Hemoglobin 12.0 - 15.0 g/dL 16.1  09.6  04.5   Hematocrit 36.0 - 46.0 % 39.1  40.8  37.4   Platelets 150 - 400 K/uL 250  320  233   WBC increased      Latest Ref Rng & Units 03/22/2023    6:36 AM 03/18/2023    4:19 PM 11/23/2022    8:03 AM  CMP  Glucose 70 - 99 mg/dL 99  409  811   BUN 8 - 23 mg/dL 20  12  12    Creatinine 0.44 - 1.00 mg/dL 9.14  7.82  9.56   Sodium 135 - 145 mmol/L 130  140  141   Potassium 3.5 - 5.1 mmol/L 4.4  5.1  4.1   Chloride 98 - 111 mmol/L 96  103  103   CO2 22 - 32 mmol/L 25  26  24    Calcium 8.9 - 10.3 mg/dL 9.2  9.6  9.5   Total Protein 6.5 - 8.1 g/dL 6.8  6.6  6.5   Total Bilirubin 0.0 - 1.2 mg/dL 0.8  <2.1  0.3   Alkaline Phos 38 - 126 U/L 56  85  69   AST 15 - 41 U/L 17  15  13    ALT 0 - 44 U/L 19  18  13    Overall electrolytes normal with mildly decreased sodium  EKG: 01/02/21 NSR  Assessment & Plan:   Discussion: 63 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 Failed methotrexate due to skin sensitivity, mouth sores and worsening cough.  Stopped methotrexate and resumed on prednisone 10/31/22. Remains symptomatic despite prednisone. Prior CT stable on prednisone 20 mg dosing. Insurance denied infliximab coverage for active sarcoid flare. Discussed risks and benefits of cellcept for alternative treatment including acute inflammatory syndrome (fever arthralgias, myalgias), bone marrow suppression and GI symptoms.  We discussed  the clinical course of sarcoid and management including serial PFTs, labs, eye exam, and EKG and chest imaging if indicated.    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 Hx of side effects (agitation, sleep, weight gain) - Annual PFTs. Last PFTs 02/2021 - Recommend annual ophthalmology exam.  Last visit unknown - EKG  reviewed. No evidence of conduction abnormalities.  - PET/CT 03/12/21 with active pulmonary sarcoid - PET/CT 12/03/21 - low-level avidity. Improved flare. Interval mild fibrosis seen L>R - CT Chest 06/17/22 - mildly worsening fibrosis  High risk medical management Prednisone 10 mg 03/18/21-06/16/21 Prednisone 20 mg 06/16/21- 09/21/21 Prednisone 10 mg 09/21/21- 01/21/22 Methotrexate 7.5 mg 6/30-7/13/24 Methotrexate 10 mg 7/14-7/16/24 due to abdominal pain Methotrexate 20 mg 10/26/22 discontinued --CONTINUE prednisone 20 mg daily on 10/31/22 --CONTINUE cellcept  500 mg BID 03/01/23 until labs return on 04/08/23  >If stable/improved WBC, then will increase cellcept 1000 mg BID. Will need new script at that time  >Discuss results on mychart --DECREASE prednisone to 10 mg to 5 mg starting 03/30/23   Chronic cough 2/2 sarcoid +/- fibrosis Etiology of fibrosis could be from post-viral/inflammatory process, prior sarcoid flare --CONTINUE Advair 115-21 mcg TWO puffs in the morning and evening. Rinse mouth out after use to prevent thrush --CONTINUE Albuterol AS NEEDED   Small pericardial effusion on CT --Echocardiogram  ordered  Health Maintenance Immunization History  Administered Date(s) Administered   Influenza Inj Mdck Quad Pf 11/22/2020   Influenza Split 12/04/2019   Influenza, Seasonal, Injecte, Preservative Fre 02/20/2016, 03/21/2023   Influenza,inj,Quad PF,6+ Mos 12/08/2016, 11/08/2018   Influenza,inj,quad, With Preservative 11/28/2017   Influenza-Unspecified 01/16/2014, 12/03/2014, 12/03/2019, 11/22/2020   Pneumococcal Polysaccharide-23 02/27/2013   Tdap 04/15/2021, 06/01/2021   CT Lung Screen - never smoker  No orders of the defined types were placed in this encounter.  No orders of the defined types were placed in this encounter.  No follow-ups on file.   I have spent a total time of 30-minutes on the day of the appointment including chart review, data review, collecting history, coordinating care and discussing medical diagnosis and plan with the patient/family. Past medical history, allergies, medications were reviewed. Pertinent imaging, labs and tests included in this note have been reviewed and interpreted independently by me.  Kashara Blocher Mechele Collin, MD [facility] 03/29/2023 3:55 PM

## 2023-04-09 LAB — COMPREHENSIVE METABOLIC PANEL
ALT: 16 IU/L (ref 0–32)
AST: 20 IU/L (ref 0–40)
Albumin: 3.9 g/dL (ref 3.9–4.9)
Alkaline Phosphatase: 75 IU/L (ref 44–121)
BUN/Creatinine Ratio: 10 — ABNORMAL LOW (ref 12–28)
BUN: 9 mg/dL (ref 8–27)
Bilirubin Total: <0.2 mg/dL (ref 0.0–1.2)
CO2: 21 mmol/L (ref 20–29)
Calcium: 9.5 mg/dL (ref 8.7–10.3)
Chloride: 107 mmol/L — ABNORMAL HIGH (ref 96–106)
Creatinine, Ser: 0.94 mg/dL (ref 0.57–1.00)
Globulin, Total: 2.4 g/dL (ref 1.5–4.5)
Glucose: 88 mg/dL (ref 70–99)
Potassium: 4.5 mmol/L (ref 3.5–5.2)
Sodium: 143 mmol/L (ref 134–144)
Total Protein: 6.3 g/dL (ref 6.0–8.5)
eGFR: 69 mL/min/{1.73_m2} (ref >59)

## 2023-04-09 LAB — CBC WITH DIFFERENTIAL/PLATELET
Basophils Absolute: 0 10*3/uL (ref 0.0–0.2)
Basos: 0 %
EOS (ABSOLUTE): 0.2 10*3/uL (ref 0.0–0.4)
Eos: 2 %
Hematocrit: 36.8 % (ref 34.0–46.6)
Hemoglobin: 12.2 g/dL (ref 11.1–15.9)
Immature Grans (Abs): 0 10*3/uL (ref 0.0–0.1)
Immature Granulocytes: 1 %
Lymphocytes Absolute: 2.1 10*3/uL (ref 0.7–3.1)
Lymphs: 27 %
MCH: 30.7 pg (ref 26.6–33.0)
MCHC: 33.2 g/dL (ref 31.5–35.7)
MCV: 93 fL (ref 79–97)
Monocytes Absolute: 0.5 10*3/uL (ref 0.1–0.9)
Monocytes: 6 %
Neutrophils Absolute: 5.1 10*3/uL (ref 1.4–7.0)
Neutrophils: 64 %
Platelets: 334 10*3/uL (ref 150–450)
RBC: 3.97 x10E6/uL (ref 3.77–5.28)
RDW: 11.7 % (ref 11.7–15.4)
WBC: 8 10*3/uL (ref 3.4–10.8)

## 2023-04-11 ENCOUNTER — Telehealth (HOSPITAL_BASED_OUTPATIENT_CLINIC_OR_DEPARTMENT_OTHER): Payer: Self-pay | Admitting: Pulmonary Disease

## 2023-04-11 DIAGNOSIS — D869 Sarcoidosis, unspecified: Secondary | ICD-10-CM

## 2023-04-11 DIAGNOSIS — Z2989 Encounter for other specified prophylactic measures: Secondary | ICD-10-CM

## 2023-04-11 NOTE — Telephone Encounter (Signed)
 Any advice? I know no interactions but wanted to make sure you were okay with this

## 2023-04-11 NOTE — Telephone Encounter (Signed)
 Patient wants to know if it is okay to take b12 and tylenol while taking sulfamethoxazole-trimethoprim (BACTRIM DS) 800-160 MG tablet patient is currently off prednisone and is starting to feel sluggish with body aches and pains.  Patient also states she may need medication refills for all meds prescribed by our office prior to see Dr. Everardo All on 05/24/23 worries she may not have enough until seeing her.  Please advise

## 2023-04-12 ENCOUNTER — Encounter (HOSPITAL_BASED_OUTPATIENT_CLINIC_OR_DEPARTMENT_OTHER): Payer: Self-pay | Admitting: Pulmonary Disease

## 2023-04-12 DIAGNOSIS — D869 Sarcoidosis, unspecified: Secondary | ICD-10-CM

## 2023-04-12 MED ORDER — MYCOPHENOLATE MOFETIL 500 MG PO TABS: 1000.0000 mg | ORAL_TABLET | Freq: Two times a day (BID) | ORAL | 1 refills | Status: DC

## 2023-04-12 MED ORDER — SULFAMETHOXAZOLE-TRIMETHOPRIM 800-160 MG PO TABS: 1.0000 | ORAL_TABLET | ORAL | 2 refills | Status: DC

## 2023-04-23 LAB — COMPREHENSIVE METABOLIC PANEL
ALT: 19 IU/L (ref 0–32)
AST: 21 IU/L (ref 0–40)
Albumin: 4.1 g/dL (ref 3.9–4.9)
Alkaline Phosphatase: 79 IU/L (ref 44–121)
BUN/Creatinine Ratio: 9 — ABNORMAL LOW (ref 12–28)
BUN: 8 mg/dL (ref 8–27)
Bilirubin Total: <0.2 mg/dL (ref 0.0–1.2)
CO2: 21 mmol/L (ref 20–29)
Calcium: 9.3 mg/dL (ref 8.7–10.3)
Chloride: 104 mmol/L (ref 96–106)
Creatinine, Ser: 0.85 mg/dL (ref 0.57–1.00)
Globulin, Total: 2.4 g/dL (ref 1.5–4.5)
Glucose: 89 mg/dL (ref 70–99)
Potassium: 4.4 mmol/L (ref 3.5–5.2)
Sodium: 141 mmol/L (ref 134–144)
Total Protein: 6.5 g/dL (ref 6.0–8.5)
eGFR: 78 mL/min/{1.73_m2} (ref >59)

## 2023-04-23 LAB — CBC WITH DIFFERENTIAL/PLATELET
Basophils Absolute: 0 10*3/uL (ref 0.0–0.2)
Basos: 0 %
EOS (ABSOLUTE): 0.2 10*3/uL (ref 0.0–0.4)
Eos: 2 %
Hematocrit: 35.9 % (ref 34.0–46.6)
Hemoglobin: 12 g/dL (ref 11.1–15.9)
Immature Grans (Abs): 0 10*3/uL (ref 0.0–0.1)
Immature Granulocytes: 0 %
Lymphocytes Absolute: 2 10*3/uL (ref 0.7–3.1)
Lymphs: 25 %
MCH: 30.6 pg (ref 26.6–33.0)
MCHC: 33.4 g/dL (ref 31.5–35.7)
MCV: 92 fL (ref 79–97)
Monocytes Absolute: 0.5 10*3/uL (ref 0.1–0.9)
Monocytes: 7 %
Neutrophils Absolute: 5 10*3/uL (ref 1.4–7.0)
Neutrophils: 66 %
Platelets: 248 10*3/uL (ref 150–450)
RBC: 3.92 x10E6/uL (ref 3.77–5.28)
RDW: 11.9 % (ref 11.7–15.4)
WBC: 7.7 10*3/uL (ref 3.4–10.8)

## 2023-04-25 ENCOUNTER — Encounter (HOSPITAL_BASED_OUTPATIENT_CLINIC_OR_DEPARTMENT_OTHER): Payer: Self-pay | Admitting: Pulmonary Disease

## 2023-05-02 NOTE — Telephone Encounter (Signed)
**Note De-identified  [place] Obfuscation** Please advise 

## 2023-05-11 IMAGING — DX DG CHEST 2V
2 series · 2 of 2 positions shown · non-contrast
Comparison: 02/04/2019

CLINICAL DATA: Short of breath.  History of sarcoidosis

EXAM:
CHEST - 2 VIEW

[chest pa]
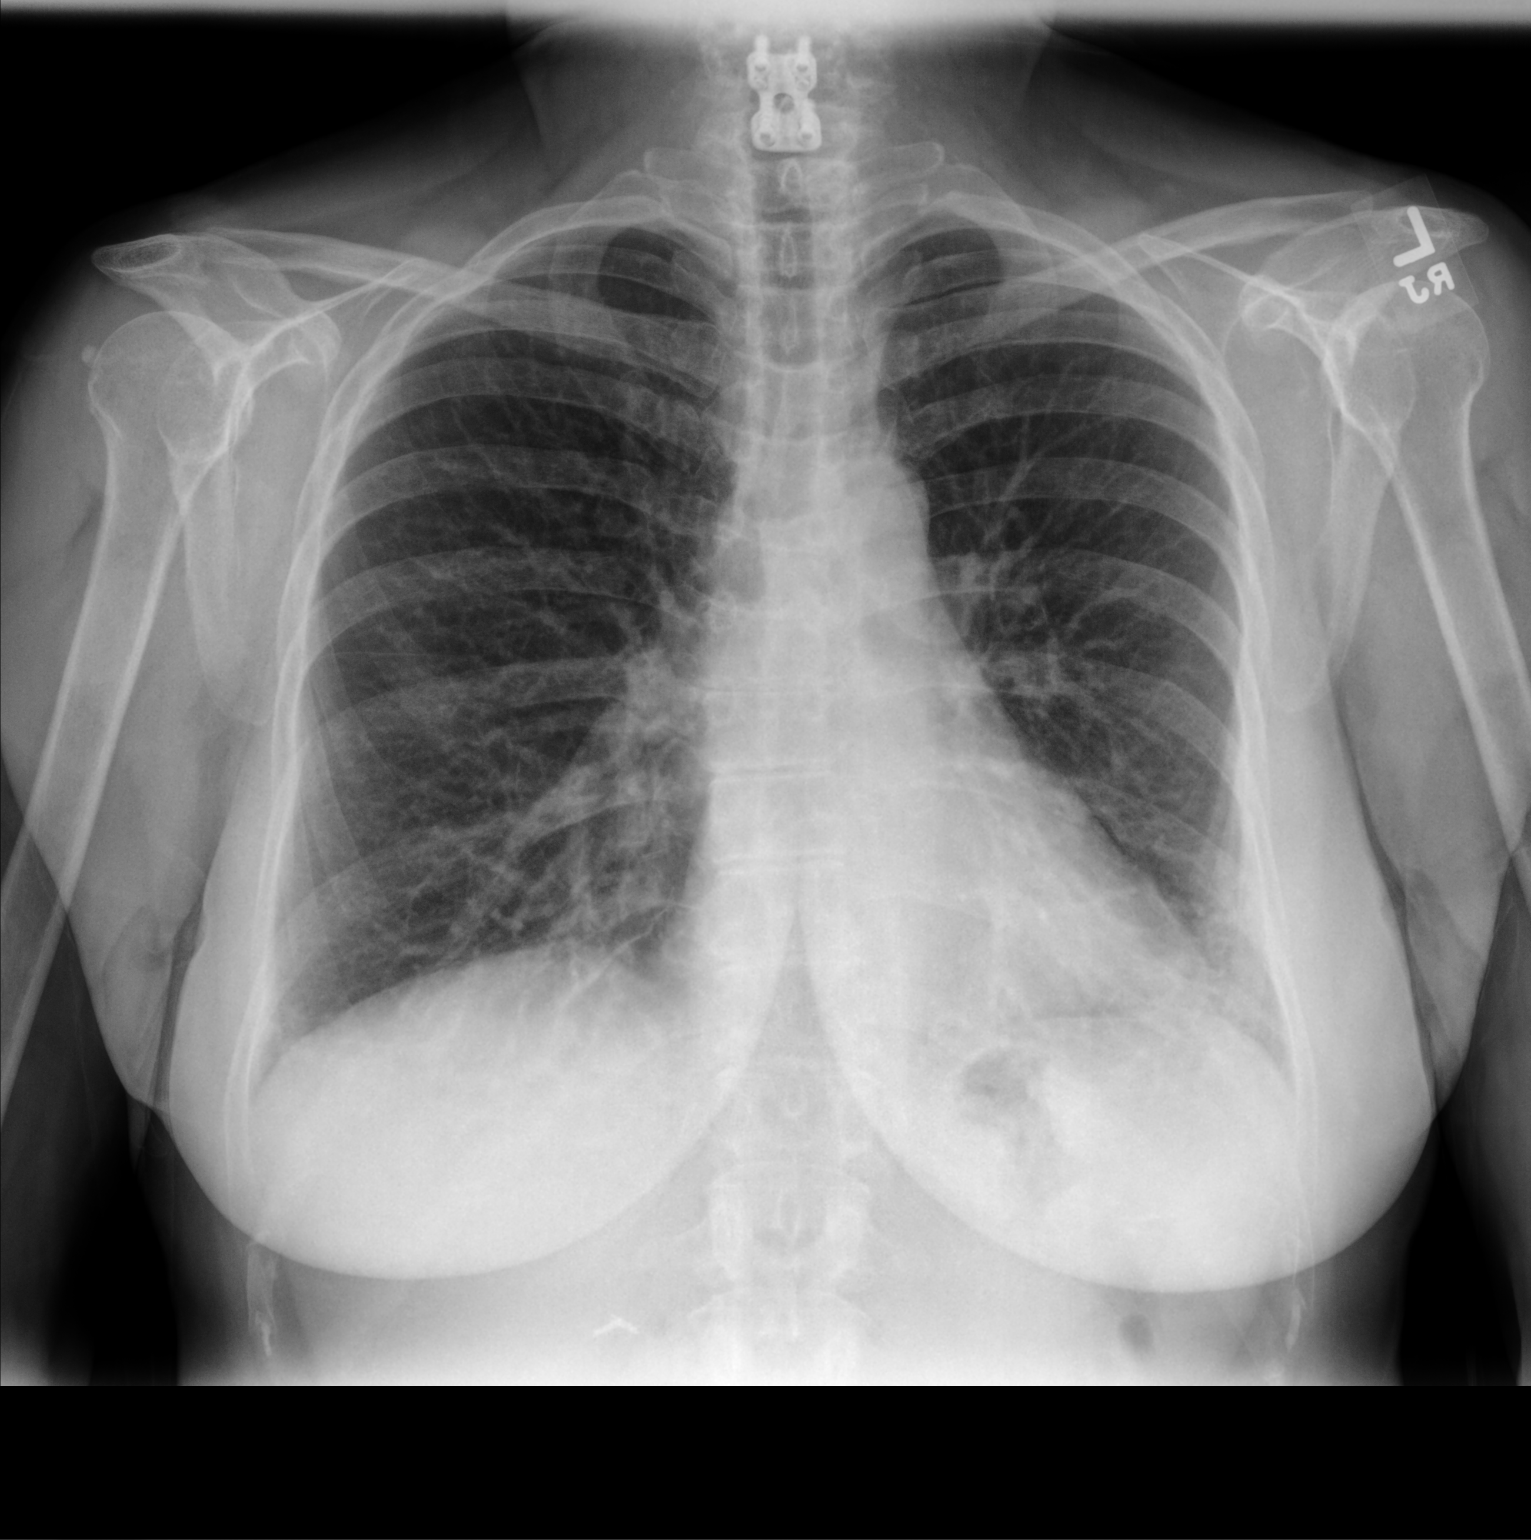

[chest lat]
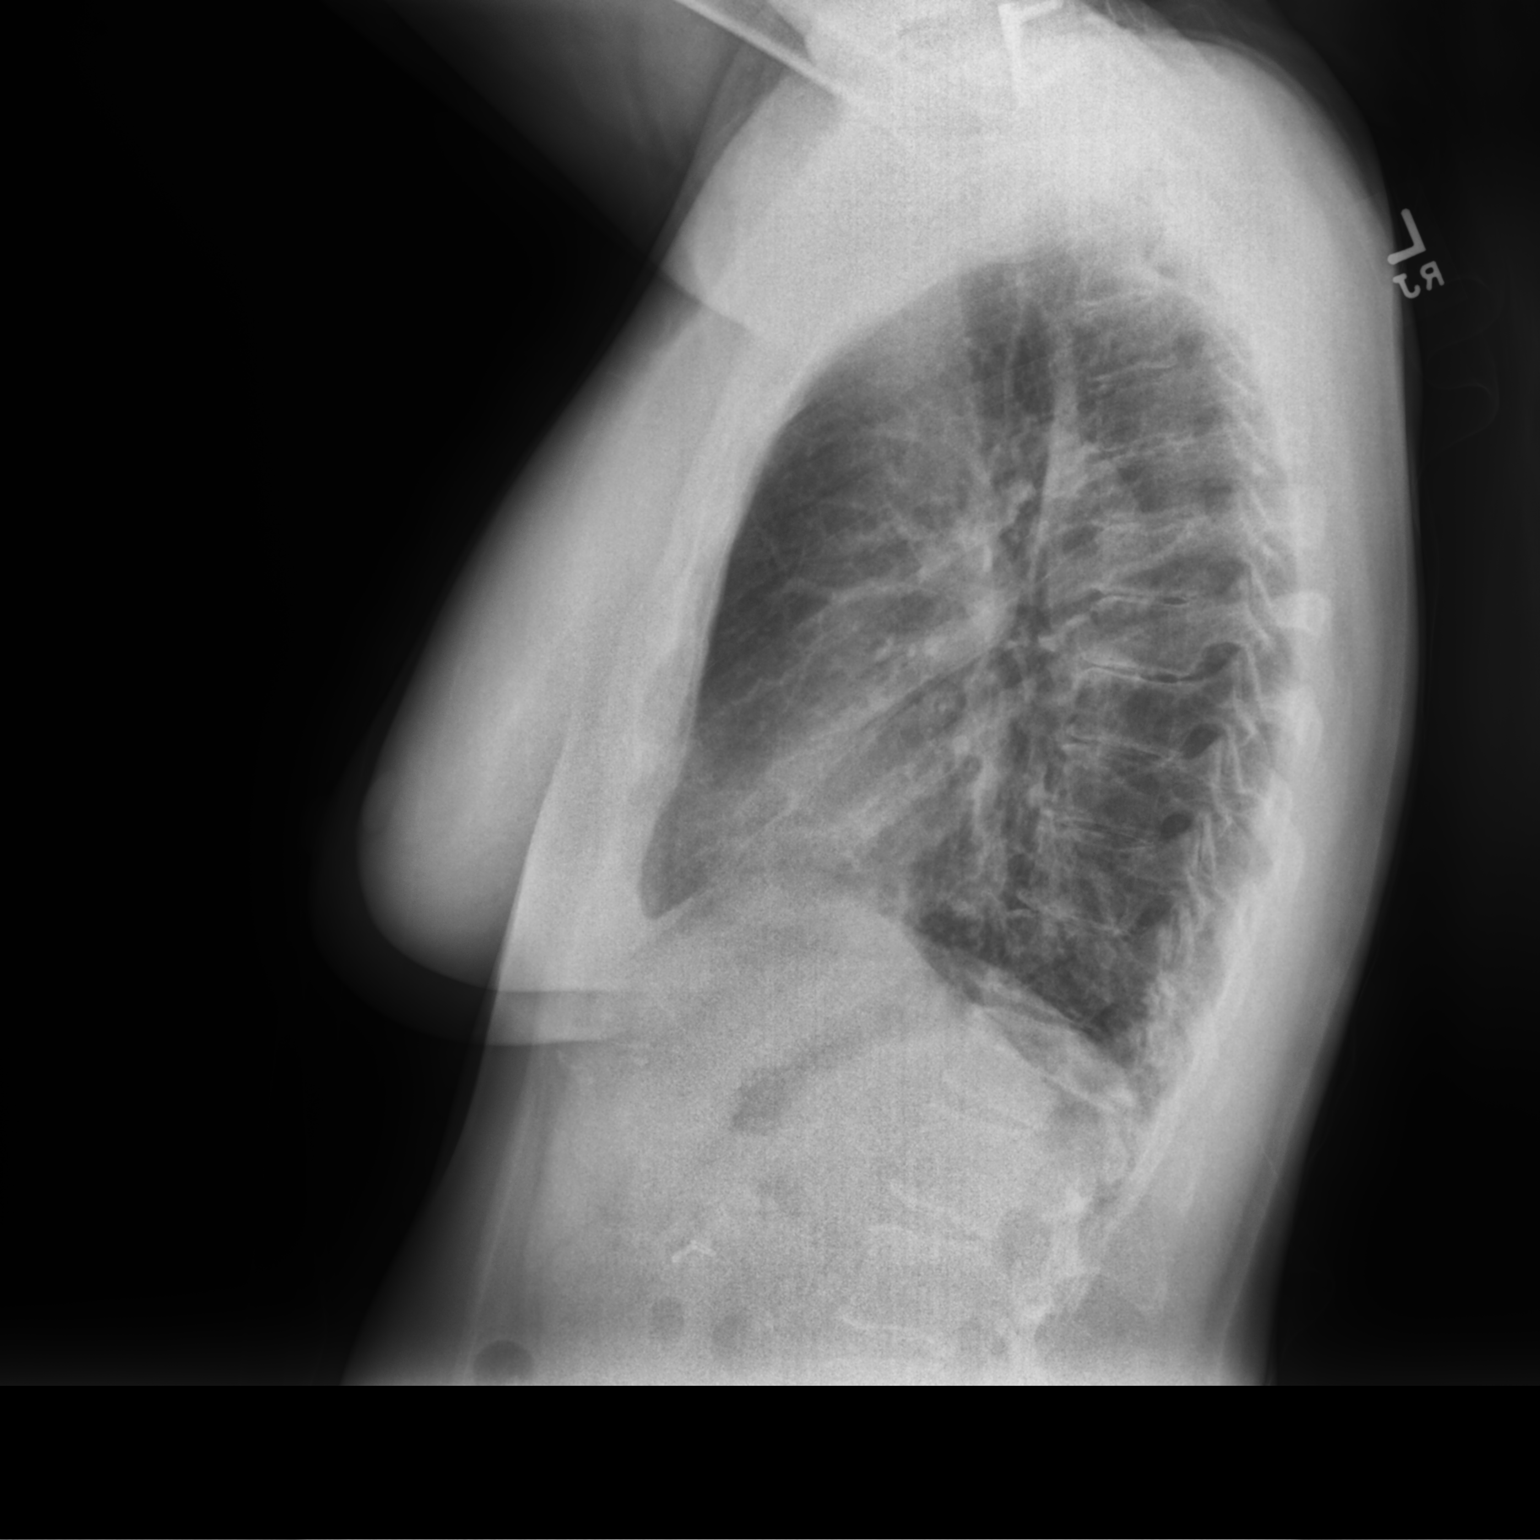

[2 of 2 positions shown; findings below may reference images not displayed]

FINDINGS: The heart size and mediastinal contours are within normal limits.
Both lungs are clear. The visualized skeletal structures are
unremarkable. No significant adenopathy in the chest. ACDF lower
cervical spine.
IMPRESSION: No active cardiopulmonary disease.

## 2023-05-11 NOTE — Progress Notes (Addendum)
 Referring Provider: Zarwolo, Gloria, FNP Primary Care Physician:  Zarwolo, Gloria, FNP Primary GI Physician: Dr. Mordechai April  Chief Complaint  Patient presents with   Abdominal Pain    Abdominal pains, nauseated, can't eat anything.     HPI:   Terri Newman is a 63 y.o. female with history of chronic GERD, dyspepsia, H. pylori gastritis initially diagnosed in 2015 treated multiple times with negative breath test November 2022, cholecystectomy, adenomatous colon polyps.  She had resurgence of epigastric pain.  Underwent H. pylori breath test 01/18/2022 which was positive.  Treated with 14 days of tetracycline, metronidazole, bismuth, PPI twice daily.  Eradication testing 04/19/2022 with H. pylori breath test still positive.  Treated again with course of Pylera x 10 days.  Subsequent H. pylori breath test negative on 06/03/2022.   Last seen in the office 01/13/2023.  She was taking pantoprazole as needed.  Overall, epigastric pain improved, only with occasional symptoms.  Today:  She is presenting today with chief complaint of recurrent epigastric abdominal pain, nausea that has been going on for the last few weeks, but worsening over the last 2 weeks.  No vomiting.  Not eating much due to her symptoms, but eating does not make her pain worse.  Associated 5 pound weight loss.  Denies any typical heartburn symptoms, dysphagia, BRBPR, melena.   States she stopped taking pantoprazole about 3 weeks ago as she knows that she has to hold it for 14 days before testing for H. pylori.  She had also been taking Bactrim 3 times a week per pulmonology, but stopped this at least 2 weeks ago.   She got sick with a respiratory illness last Monday and developed some diarrhea, but this seems to be improving.   No NSAIDs.  No alcohol.  Not currently taking any of her medications due to GI symptoms.   Had flu shot in February and next day developed terrible RUQ pain. Went to the ER. CT, US , labs  unrevealing. These symptoms resolved prior to current symptoms starting.    Last EGD 10/19/2019: Gastritis biopsied. 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  Duodenal biopsy benign.   Past Medical History:  Diagnosis Date   Adenomatous polyp of colon 08/2013   TWO, REDUNDANT LEFT COLON   Constipation by delayed colonic transit 08/23/2013   GERD (gastroesophageal reflux disease)    Phreesia 06/06/2020   H. pylori infection 09/2019   Treated with bismuth subsalicylate, tetracycline, metronidazole, and PPI twice daily x14 days.  Documented eradication November 2022 via breath test.   Helicobacter pylori gastritis 08/2013   ABO  BID FOR 10 DAYS   Neuromuscular disorder (HCC)    chronic back pain   PONV (postoperative nausea and vomiting) 11/07/2012   Sarcoidosis    Sarcoidosis     Past Surgical History:  Procedure Laterality Date   APPENDECTOMY     BIOPSY  10/19/2019   Procedure: BIOPSY;  Surgeon: Vinetta Greening, DO;  Location: [facility];  Service: Endoscopy;;   CHOLECYSTECTOMY  12/2012   [facility]   COLONOSCOPY N/A 08/29/2013   [facility]: Several simple adenomas removed, moderate sized internal hemorrhoids, slightly redundant left colon.   COLONOSCOPY N/A 05/18/2019   Procedure: COLONOSCOPY;  Surgeon: Alyce Jubilee, MD;   Two 5-8 mm polyps in the proximal ascending and distal ascending colon, external hemorrhoids, tortuous left colon.  Pathology with sessile serrated polyps.  Recommended next colonoscopy in 5 years.     ESOPHAGOGASTRODUODENOSCOPY N/A 08/29/2013  [facility]: H. pylori gastritis treated with amoxicillin/Biaxin/omeprazole   ESOPHAGOGASTRODUODENOSCOPY (EGD) WITH PROPOFOL N/A 10/19/2019   Surgeon: Vinetta Greening, DO; H. pylori gastritis, benign duodenal biopsies.   NECK SURGERY     POLYPECTOMY  05/18/2019   Procedure: POLYPECTOMY;  Surgeon: Alyce Jubilee, MD;  Location: [facility];  Service: Endoscopy;;  ascending x2   TUBAL LIGATION      Current  Outpatient Medications  Medication Sig Dispense Refill   albuterol (VENTOLIN HFA) 108 (90 Base) MCG/ACT inhaler Inhale 2 puffs into the lungs every 6 (six) hours as needed for wheezing or shortness of breath. (Patient not taking: Reported on 05/12/2023) 8 g 2   fluticasone-salmeterol (ADVAIR HFA) 115-21 MCG/ACT inhaler Inhale 2 puffs into the lungs 2 (two) times daily. (Patient not taking: Reported on 05/12/2023) 1 each 12   hydrOXYzine (VISTARIL) 25 MG capsule Take 1 capsule (25 mg total) by mouth every 8 (eight) hours as needed for anxiety. (Patient not taking: Reported on 05/12/2023) 90 capsule 1   mycophenolate (CELLCEPT) 500 MG tablet Take 2 tablets (1,000 mg total) by mouth 2 (two) times daily. (Patient not taking: Reported on 05/12/2023) 360 tablet 1   ondansetron (ZOFRAN-ODT) 4 MG disintegrating tablet Take 1 tablet (4 mg total) by mouth every 8 (eight) hours as needed. (Patient not taking: Reported on 05/12/2023) 20 tablet 0   pantoprazole (PROTONIX) 40 MG tablet TAKE 1 TABLET(40 MG) BY MOUTH TWICE DAILY (Patient not taking: Reported on 05/12/2023) 60 tablet 11   sulfamethoxazole-trimethoprim (BACTRIM DS) 800-160 MG tablet Take 1 tablet by mouth 3 (three) times a week. (Patient not taking: Reported on 05/12/2023)     Vitamin D, Ergocalciferol, (DRISDOL) 1.25 MG (50000 UNIT) CAPS capsule Take 1 capsule (50,000 Units total) by mouth every 7 (seven) days. (Patient not taking: Reported on 05/12/2023) 20 capsule 1   No current facility-administered medications for this visit.    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, pasta sauce, salsa, and other food produces   Occupation: Insurance account manager  Tobacco Use   Smoking status: Never    Passive exposure: Never   Smokeless tobacco: Never  Vaping Use   Vaping status: Never Used  Substance and Sexual Activity   Alcohol use: No   Drug use: No   Sexual activity: Not Currently  Other Topics Concern   Not on file  Social History Narrative   Not on file   Social Drivers of Health   Financial Resource Strain: Low Risk  (03/20/2023)   Overall Financial Resource Strain (CARDIA)    Difficulty of Paying Living Expenses: Not hard at all  Food Insecurity: No Food Insecurity (03/20/2023)   Hunger Vital Sign    Worried About Running Out of Food in the Last Year: Never true    Ran Out of Food in the Last Year: Never true  Transportation Needs: No Transportation Needs (03/20/2023)  PRAPARE - Administrator, Civil Service (Medical): No    Lack of Transportation (Non-Medical): No  Physical Activity: Unknown (03/20/2023)   Exercise Vital Sign    Days of Exercise per Week: 0 days    Minutes of Exercise per Session: Not on file  Stress: Stress Concern Present (03/20/2023)   [employer] of Occupational Health - Occupational Stress Questionnaire    Feeling of Stress : To some extent  Social Connections: Socially Integrated (03/20/2023)   Social Connection and Isolation Panel [NHANES]    Frequency of Communication with Friends and Family: More than three times a week    Frequency of Social Gatherings with Friends and Family: More than three times a week    Attends Religious Services: More than 4 times per year    Active Member of [employer] or Organizations: Yes    Attends Banker Meetings: Patient declined    Marital Status: Married    Review of Systems: Gen: Denies fever, chills, cold or flulike symptoms, presyncope, syncope. CV: Denies chest pain,  palpitations. Resp: Denies dyspnea, cough. GI: See HPI Heme: See HPI  Physical Exam: BP 106/71 (BP Location: Right Arm, Patient Position: Sitting, Cuff Size: Normal)   Pulse 78   Temp 97.6 F (36.4 C) (Temporal)   Ht 5' (1.524 m)   Wt 138 lb 3.2 oz (62.7 kg)   BMI 26.99 kg/m  General:   Alert and oriented. No distress noted. Pleasant and cooperative.  Head:  Normocephalic and atraumatic. Eyes:  Conjuctiva clear without scleral icterus. Heart:  S1, S2 present without murmurs appreciated. Lungs:  Clear to auscultation bilaterally. No wheezes, rales, or rhonchi. No distress.  Abdomen:  +BS, soft, and non-distended.  Mild TTP in epigastric area.  No rebound or guarding. No HSM or masses noted. Msk:  Symmetrical without gross deformities. Normal posture. Extremities:  Without edema. Neurologic:  Alert and  oriented x4 Psych:  Normal mood and affect.    Assessment:  63 year old female with history of chronic GERD, dyspepsia, recurrent H. pylori gastritis initially diagnosed in 2015 treated multiple times with negative breath test November 2022, but recurrence in December 2023 treated with 2 rounds of antibiotics and subsequent negative H. pylori breath test in May 2024, now presenting with recurrent epigastric abdominal pain and nausea which is consistent with her presentation previously every time she has been diagnosed with H. pylori.  Denies NSAIDs.  Denies alcohol.  Has been taking pantoprazole twice daily until 3 weeks ago when she stopped the medication as she knew she had to hold it for H. pylori testing.  Of note, patient was evaluated in the ER in February for right upper quadrant pain with CT A/P, RUQ ultrasound, and labs unrevealing.  Patient states these symptoms completely resolved for epigastric pain and nausea started.  While I suspect patient may in fact have recurrent H. pylori, I will also recheck liver enzymes and lipase at this time.  I do not see any need for abdominal  imaging as she has only mild TTP in the epigastric area. If H. pylori breath test is negative and labs are unrevealing, would recommend trial of different PPI and proceeding with EGD.    Plan:  H. pylori breath test, CMP, lipase Further recommendations to follow.   Shana Daring, PA-C [facility] 05/12/2023

## 2023-05-12 ENCOUNTER — Ambulatory Visit (INDEPENDENT_AMBULATORY_CARE_PROVIDER_SITE_OTHER): Admitting: Gastroenterology

## 2023-05-12 ENCOUNTER — Encounter: Payer: Self-pay | Admitting: Gastroenterology

## 2023-05-12 VITALS — BP 106/71 | HR 78 | Temp 97.6°F | Ht 60.0 in | Wt 138.2 lb

## 2023-05-12 DIAGNOSIS — Z8619 Personal history of other infectious and parasitic diseases: Secondary | ICD-10-CM

## 2023-05-12 DIAGNOSIS — R110 Nausea: Secondary | ICD-10-CM

## 2023-05-12 DIAGNOSIS — R1013 Epigastric pain: Secondary | ICD-10-CM

## 2023-05-12 DIAGNOSIS — A048 Other specified bacterial intestinal infections: Secondary | ICD-10-CM

## 2023-05-12 DIAGNOSIS — R10816 Epigastric abdominal tenderness: Secondary | ICD-10-CM | POA: Diagnosis not present

## 2023-05-12 NOTE — Patient Instructions (Signed)
 Please have labs completed at [facility].   We will cal with results and recommendations.   Can resume pantoprazole after you complete H. pylori breath test.  Ermalinda Memos, PA-C [facility]

## 2023-05-14 LAB — CMP14+EGFR
ALT: 12 IU/L (ref 0–32)
AST: 16 IU/L (ref 0–40)
Albumin: 4.1 g/dL (ref 3.9–4.9)
Alkaline Phosphatase: 79 IU/L (ref 44–121)
BUN/Creatinine Ratio: 11 — ABNORMAL LOW (ref 12–28)
BUN: 9 mg/dL (ref 8–27)
Bilirubin Total: 0.2 mg/dL (ref 0.0–1.2)
CO2: 23 mmol/L (ref 20–29)
Calcium: 9.5 mg/dL (ref 8.7–10.3)
Chloride: 103 mmol/L (ref 96–106)
Creatinine, Ser: 0.79 mg/dL (ref 0.57–1.00)
Globulin, Total: 2.9 g/dL (ref 1.5–4.5)
Glucose: 97 mg/dL (ref 70–99)
Potassium: 4.6 mmol/L (ref 3.5–5.2)
Sodium: 139 mmol/L (ref 134–144)
Total Protein: 7 g/dL (ref 6.0–8.5)
eGFR: 85 mL/min/{1.73_m2} (ref >59)

## 2023-05-14 LAB — LIPASE: Lipase: 63 U/L (ref 14–72)

## 2023-05-14 LAB — H. PYLORI BREATH TEST: H pylori Breath Test: POSITIVE — AB

## 2023-05-15 MED ORDER — BISMUTH SUBSALICYLATE 262 MG PO CAPS
524.0000 mg | ORAL_CAPSULE | Freq: Four times a day (QID) | ORAL | 0 refills | Status: AC
Start: 2023-05-15 — End: 2023-05-29

## 2023-05-15 MED ORDER — METRONIDAZOLE 500 MG PO TABS: 500.0000 mg | ORAL_TABLET | Freq: Three times a day (TID) | ORAL | 0 refills | Status: AC

## 2023-05-15 MED ORDER — TETRACYCLINE HCL 500 MG PO CAPS
500.0000 mg | ORAL_CAPSULE | Freq: Four times a day (QID) | ORAL | 0 refills | Status: AC
Start: 2023-05-15 — End: 2023-05-29

## 2023-05-15 NOTE — Addendum Note (Signed)
 Addended by: Shana Daring on: 05/15/2023 01:40 PM   Modules accepted: Orders

## 2023-05-16 ENCOUNTER — Telehealth (HOSPITAL_BASED_OUTPATIENT_CLINIC_OR_DEPARTMENT_OTHER): Payer: Self-pay | Admitting: Pulmonary Disease

## 2023-05-16 DIAGNOSIS — Z0289 Encounter for other administrative examinations: Secondary | ICD-10-CM

## 2023-05-16 NOTE — Telephone Encounter (Signed)
 Patient is calling back concerning the fmla papers . Patient is giving a fax number for dr to fax back to the people or to [employer] number [phone] [place] Jerry benefits and leave administrator

## 2023-05-16 NOTE — Telephone Encounter (Signed)
 Copied from CRM [id number]. Topic: General - Other >> May 16, 2023 10:41 AM Crist Dominion wrote: Reason for CRM: Patient states she dropped off FMLA paperwork at [facility] today 4/14 for Dr. Washington Hacker to fill out and states she really needs them by Monday 4/21  Patient dropped off forms in person. Patient was ready to pay for FMLA charge of $29. Advised patient a charge would have to be posted to her account before I could attach to it and that process has been started, but charge will take some time to post. Patient expressed that she needed these forms completed by Monday 4/21 so she can return to work. Per last encounter Dr. 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 Is it okay to go ahead and process FMLA paperwork before charge has been paid. Patient stated once posted in mychart she will pay.  Please advise on how to handle this situation. Thanks so much!  Attaching Muffy to the conversation so she is aware of the situation if not resolved by tomorrow at 5pm. [facility] spreadsheet has been completed.

## 2023-05-17 ENCOUNTER — Encounter (HOSPITAL_BASED_OUTPATIENT_CLINIC_OR_DEPARTMENT_OTHER): Payer: Self-pay

## 2023-05-17 NOTE — Telephone Encounter (Signed)
 Charge has been posted on patients account. Will make her aware.

## 2023-05-17 NOTE — Telephone Encounter (Signed)
 Please advise on medication

## 2023-05-18 ENCOUNTER — Encounter (HOSPITAL_BASED_OUTPATIENT_CLINIC_OR_DEPARTMENT_OTHER): Payer: Self-pay

## 2023-05-18 NOTE — Telephone Encounter (Signed)
 Dr Washington Hacker emailed the FMLA paperwork to myself and it was successfully faxed to the number patient provided. Terri Newman has been sent a Mychart message to be kept in the loop and has been advised to call and ask for myself if she needs anything else. Nothing further needed.

## 2023-05-24 ENCOUNTER — Encounter (HOSPITAL_BASED_OUTPATIENT_CLINIC_OR_DEPARTMENT_OTHER): Payer: Self-pay | Admitting: Pulmonary Disease

## 2023-05-24 ENCOUNTER — Ambulatory Visit (HOSPITAL_BASED_OUTPATIENT_CLINIC_OR_DEPARTMENT_OTHER): Admitting: Pulmonary Disease

## 2023-05-24 VITALS — BP 120/72 | HR 86 | Ht 60.0 in | Wt 136.9 lb

## 2023-05-24 DIAGNOSIS — D869 Sarcoidosis, unspecified: Secondary | ICD-10-CM

## 2023-05-24 MED ORDER — FLUTICASONE-SALMETEROL 115-21 MCG/ACT IN AERO: 2.0000 | INHALATION_SPRAY | Freq: Two times a day (BID) | RESPIRATORY_TRACT | 5 refills | Status: DC

## 2023-05-24 NOTE — Progress Notes (Signed)
 Subjective:   PATIENT ID: Terri Newman GENDER: female DOB: 14-Nov-1960, MRN: [id number]   HPI  Chief Complaint  Patient presents with   Follow-up    Sarcoid    Reason for Visit: Follow-up sarcoid  [name] is a 63 year old female with sarcoidosis, GERD, hx H. Pylori who presents for follow-up.  01/09/21 She was previously seen by Dr. Waymond Hailey since 01/2020 for sarcoid management. Was followed by Zoila Hines before this. She was diagnosed with sarcoidosis in 2013 via mediastinoscopy at [facility]. She was treated with prednisone  for 8 months with no recurrence off steroids. Cough was her primary symptoms with sarcoid flare. She reports fatigue but still able to work (lift boxes, walking frequently)  2022 - chronic nonproductive cough for last five months. GI work-up was negative including neg H.pylori. Recent cardiac work-up also neg. Cough does not interfere with her activities. She reports fatigue but attributes this to sleeping five hours a night. 2023 - Prednisone  10 mg x 3 months. Coughing has improved but not resolved.Increased to prednisone  20 mg. Has tolerated well except for weight gain (12lb). Weaned back to prednisone  10 mg daily. Tolerating well. Has some shortness of breath that requires albuterol  once a month. Weaned off steroids in December 2024 - Started on Advair with improvement in cough. Fatigue has worsened  07/12/22 Since our last visit she has been started on Advair. She reports cough occurs every once a while compared to daily before. Not needing rescue inhaler. No shortness of breath or wheezing. Some fatigue that is unchanged.  08/17/22 Since our last visit she reports right sided back pain that will intermittently occur. [facility] had [facility] at [facility] on [address] [place]. Not faxed yet or available in EMR. Had some mild nausea and mouth sores that self resolved. Her level of fatigue is unchanged on or off methotrexate   09/09/22 Since our last visit, back pain  has resolved. Has been off methorexate for the last four weeks. Continues to have fatigue  09/28/22 Since our last visit she is tolerating methotrexate  10 mg weekly. No oral ulcers. Unchanged fatigue and shortness of breath.  10/26/22 Since our last visit she reports feeling more fatigued with the methotrexate  and coughing has worsened. Started having mouth sores after starting methotrexate  20 mg weekly that caused mouth pain/dryness. Also noticed skin sensitivity around her eyes. In general skin itches and also had some nausea. Mouth sores have healed but her coughing is noticeably worse.  11/15/22 She remains on prednisone  20 mg daily. Reports sore throat. Denies fever, chills. Coughing and fatigue has improved after restarting steroids.  02/28/23 Since our last visit she remains on prednisone  20 mg daily. Insurance has denied her appeal for infliximab. She reports daily cough. Fatigue still present but improved on steroids.  03/29/23 Since our last visit she was started on cellcept . She did have ED visit for abdominal pain after influenza vaccine the day prior however negative work-up. She has been tolerating cellcept  otherwise. Denies shortness of breath and wheezing. Has cough and fatigue. 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 pylori infection 09/2019   Treated with bismuth  subsalicylate, tetracycline , metronidazole , and PPI twice daily x14 days.  Documented eradication November 2022 via breath test.   Helicobacter pylori gastritis 08/2013   ABO  BID FOR 10 DAYS   Neuromuscular disorder (HCC)    chronic back pain   PONV (postoperative nausea and vomiting) 11/07/2012    Sarcoidosis    Sarcoidosis      Family History  Problem Relation Age of Onset   Heart attack Mother    Alcohol abuse Mother    Heart attack Father    Alcohol abuse Father    Colon cancer Neg Hx      Social History   Occupational History   Occupation: International aid/development worker    Comment: makes ranch dressing, pasta sauce, salsa, and other food produces   Occupation: Insurance account manager  Tobacco Use   Smoking status: Never    Passive exposure: Never   Smokeless tobacco: Never  Vaping Use   Vaping status: Never Used  Substance and Sexual Activity   Alcohol use: No   Drug use: No   Sexual activity: Not Currently    No Known Allergies   Outpatient Medications Prior to Visit  Medication Sig Dispense Refill   albuterol  (VENTOLIN  HFA) 108 (90 Base) MCG/ACT inhaler Inhale 2 puffs into the lungs every 6 (six) hours as needed for wheezing or shortness of breath. 8 g 2   Bismuth  Subsalicylate 262 MG CAPS Take 524 mg by mouth 4 (four) times daily for 14 days. 112 capsule 0   hydrOXYzine  (VISTARIL ) 25 MG capsule Take 1 capsule (25 mg total) by mouth every 8 (eight) hours as needed for anxiety. 90 capsule 1   metroNIDAZOLE  (FLAGYL ) 500 MG tablet Take 1 tablet (500 mg total) by mouth 3 (three) times daily for 14 days. 42 tablet 0   mycophenolate  (CELLCEPT ) 500 MG tablet Take 2 tablets (1,000 mg total) by mouth 2 (two) times daily. 360 tablet 1   ondansetron  (ZOFRAN -ODT) 4 MG disintegrating tablet Take 1 tablet (4 mg total) by mouth every 8 (eight) hours as needed. 20 tablet 0   pantoprazole  (PROTONIX ) 40 MG tablet TAKE 1 TABLET(40 MG) BY MOUTH TWICE DAILY 60 tablet 11   sulfamethoxazole -trimethoprim  (BACTRIM  DS) 800-160 MG tablet Take 1 tablet by mouth 3 (three) times a week.     tetracycline  (SUMYCIN ) 500 MG capsule Take 1 capsule (500 mg total) by mouth 4 (four) times daily for 14 days. 56 capsule 0   Vitamin D , Ergocalciferol , (DRISDOL ) 1.25 MG (50000 UNIT) CAPS capsule Take 1 capsule (50,000  Units total) by mouth every 7 (seven) days. 20 capsule 1   fluticasone -salmeterol (ADVAIR HFA) 115-21 MCG/ACT inhaler Inhale 2 puffs into the lungs 2 (two) times daily. 1 each 12   No facility-administered medications prior to visit.    Review of Systems  Constitutional:  Positive for malaise/fatigue. Negative for chills, diaphoresis, fever and weight loss.  HENT:  Negative for congestion.   Respiratory:  Positive for cough. Negative for hemoptysis, sputum production, shortness of breath and wheezing.   Cardiovascular:  Negative for chest pain, palpitations and leg swelling.     Objective:   Vitals:   05/24/23 1452  BP: 120/72  Pulse: 86  SpO2: 97%  Weight: 136 lb 14.4 oz (62.1 kg)  Height: 5' (1.524 m)   SpO2: 97 %   Physical Exam: General: Well-appearing, no acute distress HENT: [place], AT, round facies Eyes: EOMI, no scleral icterus Respiratory: Clear to auscultation bilaterally.  No crackles, wheezing or rales Cardiovascular: RRR, -M/R/G, no JVD Extremities:-Edema,-tenderness Neuro: AAO x4, CNII-XII grossly intact  Psych: Normal mood, normal affect    Data Reviewed:  Imaging: CXR 10/15/20 - Normal. No adenopathy. No infiltrate effusion or edema PET/CT 03/10/21 - Normal cardiac perfusion and function. Hypermetabolic activity in mediastinal adenopathy and bilateral lower lobe opacities. No pleural effusions. PET/CT 12/03/21 - Low-level avidity, nonspecific. No hypermetabolic activity in adenopathy. Interval visualization of mild pulmonary fibrosis with apical to basal gradient with associated ground glass and some traction bronchiectasis. No honeycombing. CT Chest 06/17/22 - Mild worsened fibrosis. Subpleural GGO and reticulation bilaterally with moderate traction bronchiectasis CT Chest 01/14/23 - Subpleural reticulations and ground glass fibrosis and traction bronchiectasis L>R, in interval progression compared to May 2024  PFT: 01/21/14 FVC 2.65 (84%) FEV1 2.36 (95%) Ratio  86  TLC 92% DLCO 82% Interpretation: Normal spirometry  02/10/21 FVC 1.72 (60%) FEV1 1.55 (70%) Ratio 88 TLC 76% DLCO 54% Interpretation: Mild restrictive lung defect with moderately reduced diffusing capacity. No significant bronchodilator response. Small air disease present.   11/15/22 FVC 1.46 (51%) FEV1 1.27 (58%) Ratio 87  TLC 62% DLCO 67% Interpretation: Moderately severe restrictive defect with mildly reduced diffusing capacity.   Labs:    Latest Ref Rng & Units 04/22/2023    3:52 PM 04/08/2023    3:58 PM 03/22/2023    6:36 AM  CBC  WBC 3.4 - 10.8 x10E3/uL 7.7  8.0  14.6   Hemoglobin 11.1 - 15.9 g/dL 78.4  69.6  29.5   Hematocrit 34.0 - 46.6 % 35.9  36.8  39.1   Platelets 150 - 450 x10E3/uL 248  334  250   WBC increased      Latest Ref Rng & Units 05/12/2023    4:13 PM 04/22/2023    3:52 PM 04/08/2023    3:58 PM  CMP  Glucose 70 - 99 mg/dL 97  89  88   BUN 8 - 27 mg/dL 9  8  9    Creatinine 0.57 - 1.00 mg/dL 2.84  1.32  4.40   Sodium 134 - 144 mmol/L 139  141  143   Potassium 3.5 - 5.2 mmol/L 4.6  4.4  4.5   Chloride 96 - 106 mmol/L 103  104  107   CO2 20 - 29 mmol/L 23  21  21    Calcium 8.7 - 10.3 mg/dL 9.5  9.3  9.5   Total Protein 6.0 - 8.5 g/dL 7.0  6.5  6.3   Total Bilirubin 0.0 - 1.2 mg/dL 0.2  <1.0  <2.7   Alkaline Phos 44 - 121 IU/L 79  79  75   AST 0 - 40 IU/L 16  21  20    ALT 0 - 32 IU/L 12  19  16    Overall electrolytes normal with mildly decreased sodium  EKG: 01/02/21 NSR  Assessment & Plan:   Discussion: 63 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 Failed methotrexate  due to skin sensitivity, mouth sores and worsening cough. Stopped methotrexate  and resumed on prednisone  10/31/22. Remains symptomatic despite prednisone . Prior CT stable on prednisone  20 mg dosing. Insurance denied infliximab coverage for active sarcoid flare. Started on cellcept  however discontinued in 05/2020 due to GI symptoms for which she  is currently being followed by [facility].  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 Hx of side effects (agitation, sleep, weight gain) - Annual PFTs. Last PFTs 02/2021 - Recommend annual ophthalmology exam.  Last visit unknown -  EKG  reviewed. No evidence of conduction abnormalities.  - PET/CT 03/12/21 with active pulmonary sarcoid - PET/CT 12/03/21 - low-level avidity. Improved flare. 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 Rinse mouth out after use to prevent thrush --CONTINUE Albuterol  AS NEEDED    Health Maintenance Immunization History  Administered Date(s) Administered   Influenza Inj Mdck Quad Pf 11/22/2020   Influenza Split 12/04/2019   Influenza, Seasonal, Injecte, Preservative Fre 02/20/2016, 03/21/2023   Influenza,inj,Quad PF,6+ Mos 12/08/2016, 11/08/2018   Influenza,inj,quad, With Preservative 11/28/2017   Influenza-Unspecified 01/16/2014, 12/03/2014, 12/03/2019, 11/22/2020   Pneumococcal Polysaccharide-23 02/27/2013   Tdap 04/15/2021, 06/01/2021   CT Lung Screen - never smoker  Orders Placed This Encounter  Procedures   CT Chest Wo  Contrast    Standing Status:   Future    Expiration Date:   05/23/2024    Scheduling Instructions:     August 2025    Preferred imaging location?:   [facility]   Pulmonary function test    Standing Status:   Future    Expiration Date:   05/23/2024    Where should this test be performed?:   Outpatient Pulmonary    What type of PFT is being ordered?:   Full PFT   Meds ordered this encounter  Medications   fluticasone -salmeterol (ADVAIR HFA) 115-21 MCG/ACT inhaler    Sig: Inhale 2 puffs into the lungs 2 (two) times daily.    Dispense:  1 each    Refill:  5    Generic ok   Return for September. After CT   I have spent a total time of 35-minutes on the day of the appointment including chart review, data review, collecting history, coordinating care and discussing medical diagnosis and plan with the patient/family. Past medical history, allergies, medications were reviewed. Pertinent imaging, labs and tests included in this note have been reviewed and interpreted independently by me.  Maylynn Orzechowski Genetta Kenning, MD [facility] 05/24/2023 4:53 PM

## 2023-05-24 NOTE — Patient Instructions (Signed)
--  DISCONTINUE cellcept  until repeat imaging and PFTs in August 2025 --CONTINUE GI work-up as indicated

## 2023-06-13 ENCOUNTER — Ambulatory Visit: Admitting: Gastroenterology

## 2023-06-15 NOTE — H&P (View-Only) (Signed)
 Referring Provider: Zarwolo, Gloria, FNP Primary Care Physician:  Zarwolo, Gloria, FNP Primary GI Physician: Dr. Mordechai April  Chief Complaint  Patient presents with   Follow-up    Follow up. No problems     HPI:   Terri Newman is a 63 y.o. female with history of chronic GERD, dyspepsia, H. pylori gastritis initially diagnosed in 2015 treated multiple times with negative breath test November 2022, cholecystectomy, adenomatous colon polyps.   She had resurgence of epigastric pain.  Underwent H. pylori breath test 01/18/2022 which was positive.  Treated with 14 days of tetracycline , metronidazole , bismuth , PPI twice daily.  Eradication testing 04/19/2022 with H. pylori breath test still positive.  Treated again with course of Pylera  x 10 days.  Subsequent H. pylori breath test negative on 06/03/2022.  Last seen in the office 05/12/2023 reporting recurrent epigastric pain, nausea for the last few weeks.  Denied postprandial symptoms.  Associated 5 pound weight loss.  Denies any typical heartburn symptoms, dysphagia.  She had stopped taking pantoprazole  about 3 weeks prior to office visit as she knew she would need to hold it before H. pylori testing.  Recommended H. pylori breath test, CMP, lipase.  Labs showed positive H. pylori breath test.  CMP and lipase within normal limits.  Recommended treating H. pylori with bismuth  quadruple therapy with metronidazole , tetracycline , pantoprazole  x 14 days   Today:  Epigastric pain and nausea has improved, but still doesn't feel right. Has early satiety, not able to eat much like she used to. Also having odynophagia and dysphagia. Reports foods getting stuck in her esophagus.   No real heartburn or reflux. Has never really had an issue with that.   She did take all of her antibiotics for H pylori and took pantoprazole  twice a day during that time.  Had terrible nausea and intermittent vomiting secondary to antibiotics.   Bowels are getting back to  normal. States she had some diarrhea while on antibiotics and not really eating much food. Stools are now mostly formed and 1 per day. No brbpr or melena.    Once in a while she keeps getting H. pylori.  States she does not have anyone that she is kissing.  She does not drink after anybody, Valinda Gault 10/31.  She is not in any unsanitary conditions.  States she washes her hands all the time, washes fruits and vegetables from the store.  Only drinks bottled water .   Last EGD 10/19/2019: Gastritis biopsied. 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  Duodenal biopsy benign.    Past Medical History:  Diagnosis Date   Adenomatous polyp of colon 08/2013   TWO, REDUNDANT LEFT COLON   Constipation by delayed colonic transit 08/23/2013   GERD (gastroesophageal reflux disease)    Phreesia 06/06/2020   H. pylori infection 09/2019   Treated with bismuth  subsalicylate, tetracycline , metronidazole , and PPI twice daily x14 days.  Documented eradication November 2022 via breath test.   Helicobacter pylori gastritis 08/2013   ABO  BID FOR 10 DAYS   Neuromuscular disorder (HCC)    chronic back pain   PONV (postoperative nausea and vomiting) 11/07/2012   Sarcoidosis    Sarcoidosis     Past Surgical History:  Procedure Laterality Date   APPENDECTOMY     BIOPSY  10/19/2019   Procedure: BIOPSY;  Surgeon: Vinetta Greening, DO;  [facility];  Service: Endoscopy;;   CHOLECYSTECTOMY  12/2012   [facility]   COLONOSCOPY N/A 08/29/2013   SLF:  Several simple adenomas removed, moderate sized internal hemorrhoids, slightly redundant left colon.   COLONOSCOPY N/A 05/18/2019   Procedure: COLONOSCOPY;  Surgeon: Alyce Jubilee, MD;   Two 5-8 mm polyps in the proximal ascending and distal ascending colon, external hemorrhoids, tortuous left colon.  Pathology with sessile serrated polyps.  Recommended next colonoscopy in 5 years.     ESOPHAGOGASTRODUODENOSCOPY N/A 08/29/2013   SLF: H. pylori  gastritis treated with amoxicillin /Biaxin /omeprazole    ESOPHAGOGASTRODUODENOSCOPY (EGD) WITH PROPOFOL  N/A 10/19/2019   Surgeon: Vinetta Greening, DO; H. pylori gastritis, benign duodenal biopsies.   NECK SURGERY     POLYPECTOMY  05/18/2019   Procedure: POLYPECTOMY;  Surgeon: Alyce Jubilee, MD;  [facility];  Service: Endoscopy;;  ascending x2   TUBAL LIGATION      Current Outpatient Medications  Medication Sig Dispense Refill   fluticasone -salmeterol (ADVAIR HFA) 115-21 MCG/ACT inhaler Inhale 2 puffs into the lungs 2 (two) times daily. 1 each 5   Vitamin D , Ergocalciferol , (DRISDOL ) 1.25 MG (50000 UNIT) CAPS capsule Take 1 capsule (50,000 Units total) by mouth every 7 (seven) days. 20 capsule 1   albuterol  (VENTOLIN  HFA) 108 (90 Base) MCG/ACT inhaler Inhale 2 puffs into the lungs every 6 (six) hours as needed for wheezing or shortness of breath. (Patient not taking: Reported on 06/16/2023) 8 g 2   hydrOXYzine  (VISTARIL ) 25 MG capsule Take 1 capsule (25 mg total) by mouth every 8 (eight) hours as needed for anxiety. (Patient not taking: Reported on 06/16/2023) 90 capsule 1   mycophenolate  (CELLCEPT ) 500 MG tablet Take 2 tablets (1,000 mg total) by mouth 2 (two) times daily. (Patient not taking: Reported on 06/16/2023) 360 tablet 1   ondansetron  (ZOFRAN -ODT) 4 MG disintegrating tablet Take 1 tablet (4 mg total) by mouth every 8 (eight) hours as needed. (Patient not taking: Reported on 06/16/2023) 20 tablet 0   pantoprazole  (PROTONIX ) 40 MG tablet TAKE 1 TABLET(40 MG) BY MOUTH TWICE DAILY (Patient not taking: Reported on 06/16/2023) 60 tablet 11   sulfamethoxazole -trimethoprim  (BACTRIM  DS) 800-160 MG tablet Take 1 tablet by mouth 3 (three) times a week. (Patient not taking: Reported on 06/16/2023)     No current facility-administered medications for this visit.    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, pasta sauce, salsa, and other food produces   Occupation: Insurance account manager  Tobacco Use   Smoking status: Never    Passive exposure: Never   Smokeless tobacco: Never  Vaping Use   Vaping status: Never Used  Substance and Sexual Activity   Alcohol use: No   Drug use: No   Sexual activity: Not Currently  Other Topics Concern   Not on file  Social History Narrative   Not on file   Social Drivers of Health   Financial Resource Strain: Low Risk  (03/20/2023)   Overall Financial Resource Strain (CARDIA)    Difficulty  of Paying Living Expenses: Not hard at all  Food Insecurity: No Food Insecurity (03/20/2023)   Hunger Vital Sign    Worried About Running Out of Food in the Last Year: Never true    Ran Out of Food in the Last Year: Never true  Transportation Needs: No Transportation Needs (03/20/2023)   PRAPARE - Administrator, Civil Service (Medical): No    Lack of Transportation (Non-Medical): No  Physical Activity: Unknown (03/20/2023)   Exercise Vital Sign    Days of Exercise per Week: 0 days    Minutes of Exercise per Session: Not on file  Stress: Stress Concern Present (03/20/2023)   [employer] of Occupational Health - Occupational Stress Questionnaire    Feeling of Stress : To some extent  Social Connections: Socially Integrated (03/20/2023)   Social Connection and Isolation Panel [NHANES]    Frequency of Communication with Friends and Family: More than three times a week    Frequency of Social Gatherings with Friends and Family: More than three times a week     Attends Religious Services: More than 4 times per year    Active Member of [employer] or Organizations: Yes    Attends Banker Meetings: Patient declined    Marital Status: Married    Review of Systems: Gen: Denies fever, chills, cold or flulike symptoms, presyncope, syncope. CV: Denies chest pain, palpitations.  Resp: Denies dyspnea, cough.  GI: See HPI Heme: See HPI  Physical Exam: BP 121/75 (BP Location: Right Arm, Patient Position: Sitting, Cuff Size: Normal)   Pulse 89   Temp 97.7 F (36.5 C) (Temporal)   Ht 5' (1.524 m)   Wt 135 lb 3.2 oz (61.3 kg)   BMI 26.40 kg/m  General:   Alert and oriented. No distress noted. Pleasant and cooperative.  Head:  Normocephalic and atraumatic. Eyes:  Conjuctiva clear without scleral icterus. Heart:  S1, S2 present without murmurs appreciated. Lungs:  Clear to auscultation bilaterally. Coarse breath sounds. No distress.  Abdomen:  +BS, soft, non-tender and non-distended. No rebound or guarding. No HSM or masses noted. Msk:  Symmetrical without gross deformities. Normal posture. Extremities:  Without edema. Neurologic:  Alert and  oriented x4 Psych:  Normal mood and affect.    Assessment:  63 year old female with history of GERD, dyspepsia, recurrent H. pylori gastritis, cholecystectomy, adenomatous colon polyps, presenting today for follow-up of H. pylori infection, epigastric pain, nausea.  Now reporting dysphagia, odynophagia, early satiety.  H. pylori infection/epigastric pain/nausea: Patient has been treated for H. pylori numerous times daily for persistent infection or recurrence.  Initial diagnosis was in 2015.  She did have a negative breath test in November 2022.  Recurrence in December 2023.  Subsequent negative breath test in May 2024 following 2 courses of antibiotics, but again positive in April 2025.  She was treated with bismuth  quadruple therapy and reports her symptoms of epigastric pain and nausea are much  improved, but now having other upper GI symptoms including early satiety, dysphagia, odynophagia as per below.   She needs to be checked for H. pylori eradication, but ultimately needs an EGD due to new upper GI symptoms, so we will defer eradication testing to biopsy at the time of EGD.  Dysphagia/odynophagia:  New onset in the last 4 weeks.  Etiology unclear.  Could have esophageal web, ring, stricture, but may also have Candida esophagitis in the setting of recent antibiotics.  Less likely malignancy, but unable to rule this out.  Needs EGD for further evaluation.  Early satiety:  New onset early satiety.  History significant for recurrent H. pylori is concerning.  Has recently completed bismuth  quadruple therapy for recurrent H pylori and needs eradication testing as persistent H. pylori could be contributing to her symptoms, but considering early satiety is a newer symptom,  she has documented weight loss, and also new onset dysphagia and odynophagia, she needs EGD for further evaluation. Need to rule out PUD, gastric outlet obstruction, malignancy.   Plan:  Proceed with upper endoscopy +/- dilation with propofol  by Dr. Mordechai April in near future. The risks, benefits, and alternatives have been discussed with the patient in detail. The patient states understanding and desires to proceed.  ASA 3 Resume pantoprazole  40 mg daily for now.  Dysphagia precautions:  Eat slowly, take small bites, chew thoroughly, drink plenty of liquids throughout meals.  Avoid trough textures All meats should be chopped finely.  If something gets hung in your esophagus and will not come up or go down, proceed to the emergency room.   Follow-up after EGD.    Shana Daring, PA-C [facility] 06/16/2023

## 2023-06-15 NOTE — Progress Notes (Unsigned)
 Referring Provider: Zarwolo, Gloria, FNP Primary Care Physician:  Zarwolo, Gloria, FNP Primary GI Physician: Dr. Mordechai April  Chief Complaint  Patient presents with   Follow-up    Follow up. No problems     HPI:   Terri Newman is a 63 y.o. female with history of chronic GERD, dyspepsia, H. pylori gastritis initially diagnosed in 2015 treated multiple times with negative breath test November 2022, cholecystectomy, adenomatous colon polyps.   She had resurgence of epigastric pain.  Underwent H. pylori breath test 01/18/2022 which was positive.  Treated with 14 days of tetracycline , metronidazole , bismuth , PPI twice daily.  Eradication testing 04/19/2022 with H. pylori breath test still positive.  Treated again with course of Pylera  x 10 days.  Subsequent H. pylori breath test negative on 06/03/2022.  Last seen in the office 05/12/2023 reporting recurrent epigastric pain, nausea for the last few weeks.  Denied postprandial symptoms.  Associated 5 pound weight loss.  Denies any typical heartburn symptoms, dysphagia.  She had stopped taking pantoprazole  about 3 weeks prior to office visit as she knew she would need to hold it before H. pylori testing.  Recommended H. pylori breath test, CMP, lipase.  Labs showed positive H. pylori breath test.  CMP and lipase within normal limits.  Recommended treating H. pylori with bismuth  quadruple therapy with metronidazole , tetracycline , pantoprazole  x 14 days   Today:  Epigastric pain and nausea has improved, but still doesn't feel right. Has early satiety, not able to eat much like she used to. Also having odynophagia and dysphagia. Reports foods getting stuck in her esophagus.   No real heartburn or reflux. Has never really had an issue with that.   She did take all of her antibiotics for H pylori and took pantoprazole  twice a day during that time.  Had terrible nausea and intermittent vomiting secondary to antibiotics.   Bowels are getting back to  normal. States she had some diarrhea while on antibiotics and not really eating much food. Stools are now mostly formed and 1 per day. No brbpr or melena.    Once in a while she keeps getting H. pylori.  States she does not have anyone that she is kissing.  She does not drink after anybody, Valinda Gault 10/31.  She is not in any unsanitary conditions.  States she washes her hands all the time, washes fruits and vegetables from the store.  Only drinks bottled water .   Last EGD 10/19/2019: Gastritis biopsied. 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  Duodenal biopsy benign.    Past Medical History:  Diagnosis Date   Adenomatous polyp of colon 08/2013   TWO, REDUNDANT LEFT COLON   Constipation by delayed colonic transit 08/23/2013   GERD (gastroesophageal reflux disease)    Phreesia 06/06/2020   H. pylori infection 09/2019   Treated with bismuth  subsalicylate, tetracycline , metronidazole , and PPI twice daily x14 days.  Documented eradication November 2022 via breath test.   Helicobacter pylori gastritis 08/2013   ABO  BID FOR 10 DAYS   Neuromuscular disorder (HCC)    chronic back pain   PONV (postoperative nausea and vomiting) 11/07/2012   Sarcoidosis    Sarcoidosis     Past Surgical History:  Procedure Laterality Date   APPENDECTOMY     BIOPSY  10/19/2019   Procedure: BIOPSY;  Surgeon: Vinetta Greening, DO;  [facility];  Service: Endoscopy;;   CHOLECYSTECTOMY  12/2012   [facility]   COLONOSCOPY N/A 08/29/2013   SLF:  Several simple adenomas removed, moderate sized internal hemorrhoids, slightly redundant left colon.   COLONOSCOPY N/A 05/18/2019   Procedure: COLONOSCOPY;  Surgeon: Alyce Jubilee, MD;   Two 5-8 mm polyps in the proximal ascending and distal ascending colon, external hemorrhoids, tortuous left colon.  Pathology with sessile serrated polyps.  Recommended next colonoscopy in 5 years.     ESOPHAGOGASTRODUODENOSCOPY N/A 08/29/2013   SLF: H. pylori  gastritis treated with amoxicillin /Biaxin /omeprazole    ESOPHAGOGASTRODUODENOSCOPY (EGD) WITH PROPOFOL  N/A 10/19/2019   Surgeon: Vinetta Greening, DO; H. pylori gastritis, benign duodenal biopsies.   NECK SURGERY     POLYPECTOMY  05/18/2019   Procedure: POLYPECTOMY;  Surgeon: Alyce Jubilee, MD;  [facility];  Service: Endoscopy;;  ascending x2   TUBAL LIGATION      Current Outpatient Medications  Medication Sig Dispense Refill   fluticasone -salmeterol (ADVAIR HFA) 115-21 MCG/ACT inhaler Inhale 2 puffs into the lungs 2 (two) times daily. 1 each 5   Vitamin D , Ergocalciferol , (DRISDOL ) 1.25 MG (50000 UNIT) CAPS capsule Take 1 capsule (50,000 Units total) by mouth every 7 (seven) days. 20 capsule 1   albuterol  (VENTOLIN  HFA) 108 (90 Base) MCG/ACT inhaler Inhale 2 puffs into the lungs every 6 (six) hours as needed for wheezing or shortness of breath. (Patient not taking: Reported on 06/16/2023) 8 g 2   hydrOXYzine  (VISTARIL ) 25 MG capsule Take 1 capsule (25 mg total) by mouth every 8 (eight) hours as needed for anxiety. (Patient not taking: Reported on 06/16/2023) 90 capsule 1   mycophenolate  (CELLCEPT ) 500 MG tablet Take 2 tablets (1,000 mg total) by mouth 2 (two) times daily. (Patient not taking: Reported on 06/16/2023) 360 tablet 1   ondansetron  (ZOFRAN -ODT) 4 MG disintegrating tablet Take 1 tablet (4 mg total) by mouth every 8 (eight) hours as needed. (Patient not taking: Reported on 06/16/2023) 20 tablet 0   pantoprazole  (PROTONIX ) 40 MG tablet TAKE 1 TABLET(40 MG) BY MOUTH TWICE DAILY (Patient not taking: Reported on 06/16/2023) 60 tablet 11   sulfamethoxazole -trimethoprim  (BACTRIM  DS) 800-160 MG tablet Take 1 tablet by mouth 3 (three) times a week. (Patient not taking: Reported on 06/16/2023)     No current facility-administered medications for this visit.    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, pasta sauce, salsa, and other food produces   Occupation: Insurance account manager  Tobacco Use   Smoking status: Never    Passive exposure: Never   Smokeless tobacco: Never  Vaping Use   Vaping status: Never Used  Substance and Sexual Activity   Alcohol use: No   Drug use: No   Sexual activity: Not Currently  Other Topics Concern   Not on file  Social History Narrative   Not on file   Social Drivers of Health   Financial Resource Strain: Low Risk  (03/20/2023)   Overall Financial Resource Strain (CARDIA)    Difficulty  of Paying Living Expenses: Not hard at all  Food Insecurity: No Food Insecurity (03/20/2023)   Hunger Vital Sign    Worried About Running Out of Food in the Last Year: Never true    Ran Out of Food in the Last Year: Never true  Transportation Needs: No Transportation Needs (03/20/2023)   PRAPARE - Administrator, Civil Service (Medical): No    Lack of Transportation (Non-Medical): No  Physical Activity: Unknown (03/20/2023)   Exercise Vital Sign    Days of Exercise per Week: 0 days    Minutes of Exercise per Session: Not on file  Stress: Stress Concern Present (03/20/2023)   [employer] of Occupational Health - Occupational Stress Questionnaire    Feeling of Stress : To some extent  Social Connections: Socially Integrated (03/20/2023)   Social Connection and Isolation Panel [NHANES]    Frequency of Communication with Friends and Family: More than three times a week    Frequency of Social Gatherings with Friends and Family: More than three times a week     Attends Religious Services: More than 4 times per year    Active Member of [employer] or Organizations: Yes    Attends Banker Meetings: Patient declined    Marital Status: Married    Review of Systems: Gen: Denies fever, chills, cold or flulike symptoms, presyncope, syncope. CV: Denies chest pain, palpitations.  Resp: Denies dyspnea, cough.  GI: See HPI Heme: See HPI  Physical Exam: BP 121/75 (BP Location: Right Arm, Patient Position: Sitting, Cuff Size: Normal)   Pulse 89   Temp 97.7 F (36.5 C) (Temporal)   Ht 5' (1.524 m)   Wt 135 lb 3.2 oz (61.3 kg)   BMI 26.40 kg/m  General:   Alert and oriented. No distress noted. Pleasant and cooperative.  Head:  Normocephalic and atraumatic. Eyes:  Conjuctiva clear without scleral icterus. Heart:  S1, S2 present without murmurs appreciated. Lungs:  Clear to auscultation bilaterally. Coarse breath sounds. No distress.  Abdomen:  +BS, soft, non-tender and non-distended. No rebound or guarding. No HSM or masses noted. Msk:  Symmetrical without gross deformities. Normal posture. Extremities:  Without edema. Neurologic:  Alert and  oriented x4 Psych:  Normal mood and affect.    Assessment:  63 year old female with history of GERD, dyspepsia, recurrent H. pylori gastritis, cholecystectomy, adenomatous colon polyps, presenting today for follow-up of H. pylori infection, epigastric pain, nausea.  Now reporting dysphagia, odynophagia, early satiety.  H. pylori infection/epigastric pain/nausea: Patient has been treated for H. pylori numerous times daily for persistent infection or recurrence.  Initial diagnosis was in 2015.  She did have a negative breath test in November 2022.  Recurrence in December 2023.  Subsequent negative breath test in May 2024 following 2 courses of antibiotics, but again positive in April 2025.  She was treated with bismuth  quadruple therapy and reports her symptoms of epigastric pain and nausea are much  improved, but now having other upper GI symptoms including early satiety, dysphagia, odynophagia as per below.   She needs to be checked for H. pylori eradication, but ultimately needs an EGD due to new upper GI symptoms, so we will defer eradication testing to biopsy at the time of EGD.  Dysphagia/odynophagia:  New onset in the last 4 weeks.  Etiology unclear.  Could have esophageal web, ring, stricture, but may also have Candida esophagitis in the setting of recent antibiotics.  Less likely malignancy, but unable to rule this out.  Needs EGD for further evaluation.  Early satiety:  New onset early satiety.  History significant for recurrent H. pylori is concerning.  Has recently completed bismuth  quadruple therapy for recurrent H pylori and needs eradication testing as persistent H. pylori could be contributing to her symptoms, but considering early satiety is a newer symptom,  she has documented weight loss, and also new onset dysphagia and odynophagia, she needs EGD for further evaluation. Need to rule out PUD, gastric outlet obstruction, malignancy.   Plan:  Proceed with upper endoscopy +/- dilation with propofol  by Dr. Mordechai April in near future. The risks, benefits, and alternatives have been discussed with the patient in detail. The patient states understanding and desires to proceed.  ASA 3 Resume pantoprazole  40 mg daily for now.  Dysphagia precautions:  Eat slowly, take small bites, chew thoroughly, drink plenty of liquids throughout meals.  Avoid trough textures All meats should be chopped finely.  If something gets hung in your esophagus and will not come up or go down, proceed to the emergency room.   Follow-up after EGD.    Shana Daring, PA-C [facility] 06/16/2023

## 2023-06-16 ENCOUNTER — Encounter: Payer: Self-pay | Admitting: Gastroenterology

## 2023-06-16 ENCOUNTER — Ambulatory Visit (INDEPENDENT_AMBULATORY_CARE_PROVIDER_SITE_OTHER): Admitting: Gastroenterology

## 2023-06-16 VITALS — BP 121/75 | HR 89 | Temp 97.7°F | Ht 60.0 in | Wt 135.2 lb

## 2023-06-16 DIAGNOSIS — Z8619 Personal history of other infectious and parasitic diseases: Secondary | ICD-10-CM

## 2023-06-16 DIAGNOSIS — R1310 Dysphagia, unspecified: Secondary | ICD-10-CM | POA: Diagnosis not present

## 2023-06-16 DIAGNOSIS — R1013 Epigastric pain: Secondary | ICD-10-CM

## 2023-06-16 DIAGNOSIS — R6881 Early satiety: Secondary | ICD-10-CM

## 2023-06-16 NOTE — Patient Instructions (Addendum)
 Go ahead and resume taking Pantoprazole  40 mg daily, 30 minutes before breakfast.   We will get you scheduled for an upper endoscopy with possible stretching of your esophagus in the near future with Dr. Mordechai April.  Swallowing precautions:  Eat slowly, take small bites, chew thoroughly, drink plenty of liquids throughout meals.  Avoid trough textures All meats should be chopped finely.  If something gets hung in your esophagus and will not come up or go down, proceed to the emergency room.    I will see you back in the office after your upper endoscopy.  Let me know if you have any questions or concerns before your next visit.  Shana Daring, PA-C [facility]

## 2023-06-17 ENCOUNTER — Telehealth: Payer: Self-pay | Admitting: *Deleted

## 2023-06-17 NOTE — Telephone Encounter (Signed)
 LMOVM to return call  Pt left vm returning call

## 2023-06-17 NOTE — Telephone Encounter (Signed)
LMOVM to return call  EGD/ED w/Dr.Carver, asa 3

## 2023-06-20 ENCOUNTER — Encounter: Payer: Self-pay | Admitting: *Deleted

## 2023-06-20 NOTE — Telephone Encounter (Signed)
 LMOVM to return call  Pt left vm returning call

## 2023-07-01 ENCOUNTER — Encounter (HOSPITAL_COMMUNITY)
Admission: RE | Admit: 2023-07-01 | Discharge: 2023-07-01 | Disposition: A | Discharge status: Home / Self Care | Source: Ambulatory Visit | Attending: Internal Medicine | Admitting: Internal Medicine

## 2023-07-04 ENCOUNTER — Ambulatory Visit (HOSPITAL_COMMUNITY): Admitting: Anesthesiology

## 2023-07-04 ENCOUNTER — Encounter (HOSPITAL_COMMUNITY): Payer: Self-pay | Admitting: Internal Medicine

## 2023-07-04 ENCOUNTER — Ambulatory Visit (HOSPITAL_COMMUNITY)
Admission: RE | Admit: 2023-07-04 | Discharge: 2023-07-04 | Disposition: A | Discharge status: Home / Self Care | Attending: Internal Medicine | Admitting: Internal Medicine

## 2023-07-04 ENCOUNTER — Ambulatory Visit (HOSPITAL_BASED_OUTPATIENT_CLINIC_OR_DEPARTMENT_OTHER): Admitting: Anesthesiology

## 2023-07-04 ENCOUNTER — Encounter (HOSPITAL_COMMUNITY)
Admission: RE | Disposition: A | Discharge status: Home / Self Care | Payer: Self-pay | Source: Home / Self Care | Attending: Internal Medicine

## 2023-07-04 DIAGNOSIS — R1013 Epigastric pain: Secondary | ICD-10-CM | POA: Diagnosis not present

## 2023-07-04 DIAGNOSIS — Z860101 Personal history of adenomatous and serrated colon polyps: Secondary | ICD-10-CM | POA: Diagnosis not present

## 2023-07-04 DIAGNOSIS — Z8719 Personal history of other diseases of the digestive system: Secondary | ICD-10-CM | POA: Insufficient documentation

## 2023-07-04 DIAGNOSIS — K2950 Unspecified chronic gastritis without bleeding: Secondary | ICD-10-CM | POA: Diagnosis not present

## 2023-07-04 DIAGNOSIS — F419 Anxiety disorder, unspecified: Secondary | ICD-10-CM | POA: Diagnosis not present

## 2023-07-04 DIAGNOSIS — Z79624 Long term (current) use of inhibitors of nucleotide synthesis: Secondary | ICD-10-CM | POA: Insufficient documentation

## 2023-07-04 DIAGNOSIS — K219 Gastro-esophageal reflux disease without esophagitis: Secondary | ICD-10-CM | POA: Insufficient documentation

## 2023-07-04 DIAGNOSIS — D869 Sarcoidosis, unspecified: Secondary | ICD-10-CM | POA: Insufficient documentation

## 2023-07-04 DIAGNOSIS — I10 Essential (primary) hypertension: Secondary | ICD-10-CM | POA: Diagnosis not present

## 2023-07-04 DIAGNOSIS — Z7951 Long term (current) use of inhaled steroids: Secondary | ICD-10-CM | POA: Insufficient documentation

## 2023-07-04 DIAGNOSIS — Z79899 Other long term (current) drug therapy: Secondary | ICD-10-CM | POA: Diagnosis not present

## 2023-07-04 DIAGNOSIS — R1310 Dysphagia, unspecified: Secondary | ICD-10-CM | POA: Insufficient documentation

## 2023-07-04 DIAGNOSIS — B9681 Helicobacter pylori [H. pylori] as the cause of diseases classified elsewhere: Secondary | ICD-10-CM | POA: Insufficient documentation

## 2023-07-04 DIAGNOSIS — K2970 Gastritis, unspecified, without bleeding: Secondary | ICD-10-CM | POA: Diagnosis not present

## 2023-07-04 HISTORY — PX: ESOPHAGEAL DILATION: SHX303

## 2023-07-04 HISTORY — PX: ESOPHAGOGASTRODUODENOSCOPY: SHX5428

## 2023-07-04 SURGERY — EGD (ESOPHAGOGASTRODUODENOSCOPY)
Anesthesia: General

## 2023-07-04 MED ORDER — LIDOCAINE 2% (20 MG/ML) 5 ML SYRINGE
INTRAMUSCULAR | Status: DC | PRN
  Administered 2023-07-04: 100 mg via INTRAVENOUS

## 2023-07-04 MED ORDER — PANTOPRAZOLE SODIUM 40 MG PO TBEC: 40.0000 mg | DELAYED_RELEASE_TABLET | Freq: Every day | ORAL | Status: DC

## 2023-07-04 MED ORDER — LACTATED RINGERS IV SOLN: INTRAVENOUS | Status: DC | PRN

## 2023-07-04 MED ORDER — LACTATED RINGERS IV SOLN: INTRAVENOUS | Status: DC

## 2023-07-04 MED ORDER — PROPOFOL 10 MG/ML IV BOLUS
INTRAVENOUS | Status: DC | PRN
  Administered 2023-07-04: 50 mg via INTRAVENOUS
  Administered 2023-07-04: 120 mg via INTRAVENOUS

## 2023-07-04 NOTE — Discharge Instructions (Signed)
 EGD Discharge instructions Please read the instructions outlined below and refer to this sheet in the next few weeks. These discharge instructions provide you with general information on caring for yourself after you leave the hospital. Your doctor may also give you specific instructions. While your treatment has been planned according to the most current medical practices available, unavoidable complications occasionally occur. If you have any problems or questions after discharge, please call your doctor. ACTIVITY You may resume your regular activity but move at a slower pace for the next 24 hours.  Take frequent rest periods for the next 24 hours.  Walking will help expel (get rid of) the air and reduce the bloated feeling in your abdomen.  No driving for 24 hours (because of the anesthesia (medicine) used during the test).  You may shower.  Do not sign any important legal documents or operate any machinery for 24 hours (because of the anesthesia used during the test).  NUTRITION Drink plenty of fluids.  You may resume your normal diet.  Begin with a light meal and progress to your normal diet.  Avoid alcoholic beverages for 24 hours or as instructed by your caregiver.  MEDICATIONS You may resume your normal medications unless your caregiver tells you otherwise.  WHAT YOU CAN EXPECT TODAY You may experience abdominal discomfort such as a feeling of fullness or "gas" pains.  FOLLOW-UP Your doctor will discuss the results of your test with you.  SEEK IMMEDIATE MEDICAL ATTENTION IF ANY OF THE FOLLOWING OCCUR: Excessive nausea (feeling sick to your stomach) and/or vomiting.  Severe abdominal pain and distention (swelling).  Trouble swallowing.  Temperature over 101 F (37.8 C).  Rectal bleeding or vomiting of blood.   Your EGD revealed mild amount inflammation in your stomach.  I took biopsies of this to rule out infection with  H. pylori.  I also took samples of your esophagus. Await  pathology results, my office will contact you.  I did stretch your esophagus today. Hopefully this helps with feeling of food getting stuck.  Small bowel appeared normal.  Continue on pantoprazole  daily.  Follow-up in GI office in 8 weeks.  I hope you have a great rest of your week!  Rolando Cliche. Mordechai April, D.O. Gastroenterology and Hepatology [facility]

## 2023-07-04 NOTE — Anesthesia Procedure Notes (Signed)
 Date/Time: 07/04/2023 7:28 AM  Performed by: Sherwin Donate, CRNAPre-anesthesia Checklist: Patient identified, Emergency Drugs available, Suction available and Patient being monitored Patient Re-evaluated:Patient Re-evaluated prior to induction Oxygen Delivery Method: Nasal cannula Induction Type: IV induction Placement Confirmation: positive ETCO2 Comments: Optiflow High Flow Verdel O2 used.

## 2023-07-04 NOTE — Transfer of Care (Signed)
 Immediate Anesthesia Transfer of Care Note  Patient: Terri Newman  Procedure(s) Performed: EGD (ESOPHAGOGASTRODUODENOSCOPY) DILATION, ESOPHAGUS  Patient Location: Short Stay  Anesthesia Type:General  Level of Consciousness: awake  Airway & Oxygen Therapy: Patient Spontanous Breathing  Post-op Assessment: Report given to RN and Post -op Vital signs reviewed and stable  Post vital signs: Reviewed and stable  Last Vitals:  Vitals Value Taken Time  BP    Temp    Pulse    Resp    SpO2      Last Pain:  Vitals:   07/04/23 0729  PainSc: 0-No pain         Complications: No notable events documented.

## 2023-07-04 NOTE — Interval H&P Note (Signed)
 History and Physical Interval Note:  07/04/2023 7:24 AM  Terri Newman  has presented today for surgery, with the diagnosis of dysphagia,epigastic pain,early satiety,hx: H pylori.  The various methods of treatment have been discussed with the patient and family. After consideration of risks, benefits and other options for treatment, the patient has consented to  Procedure(s) with comments: EGD (ESOPHAGOGASTRODUODENOSCOPY) (N/A) - 7:30 am, asa 3 DILATION, ESOPHAGUS (N/A) as a surgical intervention.  The patient's history has been reviewed, patient examined, no change in status, stable for surgery.  I have reviewed the patient's chart and labs.  Questions were answered to the patient's satisfaction.     Vinetta Greening

## 2023-07-04 NOTE — Anesthesia Preprocedure Evaluation (Signed)
 Anesthesia Evaluation  Patient identified by MRN, date of birth, ID band Patient awake    Reviewed: Allergy & Precautions, H&P , NPO status , Patient's Chart, lab work & pertinent test results, reviewed documented beta blocker date and time   History of Anesthesia Complications (+) PONV and history of anesthetic complications  Airway Mallampati: II  TM Distance: >3 FB Neck ROM: full    Dental no notable dental hx.    Pulmonary neg pulmonary ROS   Pulmonary exam normal breath sounds clear to auscultation       Cardiovascular Exercise Tolerance: Good hypertension, negative cardio ROS  Rhythm:regular Rate:Normal     Neuro/Psych   Anxiety      Neuromuscular disease  negative psych ROS   GI/Hepatic Neg liver ROS,GERD  ,,  Endo/Other  negative endocrine ROS    Renal/GU negative Renal ROS  negative genitourinary   Musculoskeletal   Abdominal   Peds  Hematology negative hematology ROS (+)   Anesthesia Other Findings   Reproductive/Obstetrics negative OB ROS                             Anesthesia Physical Anesthesia Plan  ASA: 3  Anesthesia Plan: General   Post-op Pain Management:    Induction:   PONV Risk Score and Plan: Propofol  infusion  Airway Management Planned:   Additional Equipment:   Intra-op Plan:   Post-operative Plan:   Informed Consent: I have reviewed the patients History and Physical, chart, labs and discussed the procedure including the risks, benefits and alternatives for the proposed anesthesia with the patient or authorized representative who has indicated his/her understanding and acceptance.     Dental Advisory Given  Plan Discussed with: CRNA  Anesthesia Plan Comments:        Anesthesia Quick Evaluation

## 2023-07-04 NOTE — Op Note (Signed)
 [facility] Patient Name: Terri Newman Procedure Date: 07/04/2023 7:02 AM MRN: [id number] Date of Birth: 12-Apr-1960 Attending MD: Rolando Cliche. Mordechai April , [place], [id number] CSN: [id number] Age: 63 Admit Type: Outpatient Procedure:                Upper GI endoscopy Indications:              Epigastric abdominal pain, Dysphagia Providers:                Rolando Cliche. Mordechai April, DO, Vonna Guardian, Ruthine Cowper Technician, Technician Referring MD:              Medicines:                See the Anesthesia note for documentation of the                            administered medications Complications:            No immediate complications. Estimated Blood Loss:     Estimated blood loss was minimal. Procedure:                Pre-Anesthesia Assessment:                           - The anesthesia plan was to use monitored                            anesthesia care (MAC).                           After obtaining informed consent, the endoscope was                            passed under direct vision. Throughout the                            procedure, the patient's blood pressure, pulse, and                            oxygen saturations were monitored continuously. The                            GIF-H190 ([id number]) scope was introduced through the                            mouth, and advanced to the second part of duodenum.                            The upper GI endoscopy was accomplished without                            difficulty. The patient tolerated the procedure                            well.  Scope In: 7:33:19 AM Scope Out: 7:39:32 AM Total Procedure Duration: 0 hours 6 minutes 13 seconds  Findings:      The Z-line was regular and was found 36 cm from the incisors.      Biopsies were taken with a cold forceps in the middle third of the       esophagus for histology.      No endoscopic abnormality was evident in the esophagus to explain the        patient's complaint of dysphagia. Preparations were made for empiric       dilation. A TTS dilator was passed through the scope. Dilation with an       18-19-20 mm balloon dilator was performed to 20 mm. Dilation was       performed with a mild resistance at 20 mm. Estimated blood loss was none.      Patchy minimal inflammation characterized by erythema was found in the       gastric body. Biopsies were taken with a cold forceps for Helicobacter       pylori testing.      The duodenal bulb, first portion of the duodenum and second portion of       the duodenum were normal. Impression:               - Z-line regular, 36 cm from the incisors.                           - Gastritis. Biopsied.                           - Normal duodenal bulb, first portion of the                            duodenum and second portion of the duodenum.                           - Biopsies were taken with a cold forceps for                            histology in the middle third of the esophagus. Moderate Sedation:      Per Anesthesia Care Recommendation:           - Patient has a contact number available for                            emergencies. The signs and symptoms of potential                            delayed complications were discussed with the                            patient. Return to normal activities tomorrow.                            Written discharge instructions were provided to the                            patient.                           -  Resume previous diet.                           - Continue present medications.                           - Await pathology results.                           - Repeat upper endoscopy PRN for retreatment.                           - Return to GI clinic in 8 weeks.                           - Use Protonix  (pantoprazole ) 40 mg PO daily. Procedure Code(s):        --- Professional ---                           [phone], Esophagogastroduodenoscopy,  flexible,                            transoral; with biopsy, single or multiple Diagnosis Code(s):        --- Professional ---                           K29.70, Gastritis, unspecified, without bleeding                           R10.13, Epigastric pain                           R13.10, Dysphagia, unspecified CPT copyright 2022 American Medical Association. All rights reserved. The codes documented in this report are preliminary and upon coder review may  be revised to meet current compliance requirements. Rolando Cliche. Mordechai April, DO Rolando Cliche. Mordechai April, DO 07/04/2023 7:43:58 AM This report has been signed electronically. Number of Addenda: 0

## 2023-07-05 ENCOUNTER — Encounter (HOSPITAL_COMMUNITY): Payer: Self-pay | Admitting: Internal Medicine

## 2023-07-06 ENCOUNTER — Encounter (HOSPITAL_BASED_OUTPATIENT_CLINIC_OR_DEPARTMENT_OTHER): Payer: Self-pay

## 2023-07-06 LAB — SURGICAL PATHOLOGY

## 2023-07-07 NOTE — Anesthesia Postprocedure Evaluation (Signed)
 Anesthesia Post Note  Patient: Terri Newman  Procedure(s) Performed: EGD (ESOPHAGOGASTRODUODENOSCOPY) DILATION, ESOPHAGUS  Patient location during evaluation: Phase II Anesthesia Type: General Level of consciousness: awake Pain management: pain level controlled Vital Signs Assessment: post-procedure vital signs reviewed and stable Respiratory status: spontaneous breathing and respiratory function stable Cardiovascular status: blood pressure returned to baseline and stable Postop Assessment: no headache and no apparent nausea or vomiting Anesthetic complications: no Comments: Late entry   No notable events documented.   Last Vitals:  Vitals:   07/04/23 0652 07/04/23 0742  BP: 124/64 (!) 103/45  Pulse:  82  Resp: 17 16  Temp: 37 C 36.6 C  SpO2: 99% 98%    Last Pain:  Vitals:   07/05/23 1530  TempSrc:   PainSc: 0-No pain                 Coretha Dew

## 2023-07-08 ENCOUNTER — Other Ambulatory Visit: Payer: Self-pay | Admitting: Internal Medicine

## 2023-07-08 ENCOUNTER — Ambulatory Visit: Payer: Self-pay | Admitting: Internal Medicine

## 2023-07-08 DIAGNOSIS — K219 Gastro-esophageal reflux disease without esophagitis: Secondary | ICD-10-CM

## 2023-07-08 MED ORDER — AMOXICILLIN 500 MG PO TABS: 1000.0000 mg | ORAL_TABLET | Freq: Two times a day (BID) | ORAL | 0 refills | Status: AC

## 2023-07-08 MED ORDER — PANTOPRAZOLE SODIUM 40 MG PO TBEC
40.0000 mg | DELAYED_RELEASE_TABLET | Freq: Two times a day (BID) | ORAL | 11 refills | Status: DC
Start: 2023-07-08 — End: 2023-08-29

## 2023-07-08 MED ORDER — LEVOFLOXACIN 500 MG PO TABS: 500.0000 mg | ORAL_TABLET | Freq: Two times a day (BID) | ORAL | 0 refills | Status: AC

## 2023-07-08 MED ORDER — BISMUTH 262 MG PO CHEW: 2.0000 | CHEWABLE_TABLET | Freq: Four times a day (QID) | ORAL | 0 refills | Status: AC

## 2023-07-21 ENCOUNTER — Ambulatory Visit: Admitting: Family Medicine

## 2023-07-21 ENCOUNTER — Telehealth (HOSPITAL_BASED_OUTPATIENT_CLINIC_OR_DEPARTMENT_OTHER): Payer: Self-pay

## 2023-07-21 NOTE — Telephone Encounter (Signed)
 Copied from CRM [id number]. Topic: Clinical - Medical Advice >> Jul 20, 2023  4:01 PM Terri Newman wrote: Reason for CRM: pt wants to know what her dx is?  She is applying for disability and needs medical terminology about what she has.  In addition, pt wants to know when she finishing  amoxicillin  (AMOXIL ) 500 MG tablet  AND levofloxacin  (LEVAQUIN ) 500 MG tablet Can she start back taking her mycophenolate  (CELLCEPT ) 500 MG tablet   Pt would like Dr Jacqualyn Mates input if possible.

## 2023-07-21 NOTE — Telephone Encounter (Signed)
 She will finish those on Sunday Dr Mordechai April in GI actually placed her on these. She has an appt in July for F/U with GI. She was advised per Dr Fayetta Hoose last note she should remain off of these till after PFT in August and she should follow-up with GI on the other medications.

## 2023-07-21 NOTE — Telephone Encounter (Signed)
 Reviewed last office note no mention of amoxicillin  and Levaquin  why she taking this and who put her on that.  According to last note that she stopped methotrexate  and CellCept  due to intolerance. LOV note says remain off until GI workup and PFT .  Has she been seeing another provider.

## 2023-07-29 ENCOUNTER — Telehealth: Payer: Self-pay

## 2023-07-29 NOTE — Telephone Encounter (Signed)
 Copied from CRM [id number]. Topic: Clinical - Medical Advice >> Jul 29, 2023  8:58 AM Devaughn RAMAN wrote: Reason for CRM: Patient called regarding her disability paperwork. Patient requested to speak with Morna. Patient stated she has her own paperwork that does not need to be filled out by the provider nor include a provider signature but the paperwork she has needs to the include the diagnosis she currently has, patient is a patient of Dr.Ellison.

## 2023-08-02 NOTE — Telephone Encounter (Signed)
 Spoke with pt and diagnosis given

## 2023-08-08 ENCOUNTER — Telehealth: Payer: Self-pay

## 2023-08-08 NOTE — Telephone Encounter (Signed)
 Returned the pt's call regarding her re-test for H Pylori. Advised the pt that Dr Cindie' last instructions were to discuss at the next appt and from there if POS will seek other treatment. Pt expressed understanding and made aware of upcoming appt 7/28th.

## 2023-08-28 NOTE — Progress Notes (Unsigned)
 Referring Provider: Zarwolo, Gloria, FNP Primary Care Physician:  Zarwolo, Gloria, FNP Primary GI Physician: Dr. Cindie  Chief Complaint  Patient presents with   Follow-up    Follow up. No problems     HPI:   Terri Newman is a 63 y.o. female with history of GERD, dyspepsia, recurrent H. pylori gastritis, cholecystectomy, adenomatous colon polyps, presenting today for follow-up of early satiety, dysphagia, odynophagia, H pylori, s/p EGD.   EGD 07/04/23:  - Z- line regular, 36 cm from the incisors.  - Gastritis. Biopsied.  - Normal duodenal bulb, first portion of the duodenum and second portion of the duodenum.  - Biopsies were taken with a cold forceps for histology in the middle third of the esophagus. - Empiric esophageal dilation performed.  - Path with H pylori  gastritis.  - She was prescribed 14-day course of levofloxacin  500 mg twice daily, amoxicillin  1000 mg twice daily, bismuth , pantoprazole  twice daily.  Stated if unsuccessful, would need to consider referral to ID for rifabutin based therapy.                               Today:  Dysphagia/odynophagia: Resolved following esophageal dilation.  H. Pylori: Reports she is feeling better after completing antibiotic course.  No nausea, vomiting, early satiety, abdominal pain.  Not taking pantoprazole  though she did take it twice a day when taking her antibiotics.  Currently taking over-the-counter omeprazole  prn if she is going to eat something that she knows will cause her problems like wings, fried/spicy foods. Last took Saturday.   Bowels are moving well without BRBPR or melena.   Past Medical History:  Diagnosis Date   Adenomatous polyp of colon 08/2013   TWO, REDUNDANT LEFT COLON   Constipation by delayed colonic transit 08/23/2013   GERD (gastroesophageal reflux disease)    Phreesia 06/06/2020   H. pylori infection 09/2019   Treated with bismuth  subsalicylate, tetracycline , metronidazole , and PPI twice  daily x14 days.  Documented eradication November 2022 via breath test.   H. pylori infection 01/2022   Confirmed eradication in May 2024.   H. pylori infection 05/2023   Persistent infection noted at the time of EGD 07/04/2023   Helicobacter pylori gastritis 08/2013   ABO  BID FOR 10 DAYS   Neuromuscular disorder (HCC)    chronic back pain   PONV (postoperative nausea and vomiting) 11/07/2012   Sarcoidosis    Sarcoidosis     Past Surgical History:  Procedure Laterality Date   APPENDECTOMY     BIOPSY  10/19/2019   Procedure: BIOPSY;  Surgeon: Cindie Carlin POUR, DO;  [facility];  Service: Endoscopy;;   CHOLECYSTECTOMY  12/2012   [facility]   COLONOSCOPY N/A 08/29/2013   SLF: Several simple adenomas removed, moderate sized internal hemorrhoids, slightly redundant left colon.   COLONOSCOPY N/A 05/18/2019   Procedure: COLONOSCOPY;  Surgeon: Harvey Margo CROME, MD;   Two 5-8 mm polyps in the proximal ascending and distal ascending colon, external hemorrhoids, tortuous left colon.  Pathology with sessile serrated polyps.  Recommended next colonoscopy in 5 years.     ESOPHAGEAL DILATION N/A 07/04/2023   Procedure: DILATION, ESOPHAGUS;  Surgeon: Cindie Carlin POUR, DO;  [facility];  Service: Endoscopy;  Laterality: N/A;   ESOPHAGOGASTRODUODENOSCOPY N/A 08/29/2013   SLF: H. pylori gastritis treated with amoxicillin /Biaxin /omeprazole    ESOPHAGOGASTRODUODENOSCOPY N/A 07/04/2023   Procedure: EGD (ESOPHAGOGASTRODUODENOSCOPY);  Surgeon: Cindie Carlin POUR,  DO;  [facility]: [facility];  Service: Endoscopy;  Laterality: N/A;  7:30 am, asa 3   ESOPHAGOGASTRODUODENOSCOPY (EGD) WITH PROPOFOL  N/A 10/19/2019   Surgeon: Cindie Carlin POUR, DO; H. pylori gastritis, benign duodenal biopsies.   NECK SURGERY     POLYPECTOMY  05/18/2019   Procedure: POLYPECTOMY;  Surgeon: Harvey Margo CROME, MD;  Location: [facility];  Service: Endoscopy;;  ascending x2   TUBAL LIGATION      Current Outpatient  Medications  Medication Sig Dispense Refill   fluticasone -salmeterol (ADVAIR HFA) 115-21 MCG/ACT inhaler Inhale 2 puffs into the lungs 2 (two) times daily. 1 each 5   Vitamin D , Ergocalciferol , (DRISDOL ) 1.25 MG (50000 UNIT) CAPS capsule Take 1 capsule (50,000 Units total) by mouth every 7 (seven) days. 20 capsule 1   mycophenolate  (CELLCEPT ) 500 MG tablet Take 2 tablets (1,000 mg total) by mouth 2 (two) times daily. (Patient not taking: Reported on 08/29/2023) 360 tablet 1   No current facility-administered medications for this visit.    Allergies as of 08/29/2023   (No Known Allergies)    Family History  Problem Relation Age of Onset   Heart attack Mother    Alcohol abuse Mother    Heart attack Father    Alcohol abuse Father    Colon cancer Neg Hx     Social History   Socioeconomic History   Marital status: Married    Spouse name: Not on file   Number of children: 3   Years of education: Not on file   Highest education level: 12th grade  Occupational History   Occupation: International aid/development worker    Comment: makes ranch dressing, pasta sauce, salsa, and other food produces   Occupation: Insurance account manager  Tobacco Use   Smoking status: Never    Passive exposure: Never   Smokeless tobacco: Never  Vaping Use   Vaping status: Never Used  Substance and Sexual Activity   Alcohol use: No   Drug use: No   Sexual activity: Not Currently  Other Topics Concern   Not on file  Social History Narrative   Not on file   Social Drivers of Health   Financial Resource Strain: Low Risk  (07/18/2023)   Overall Financial Resource Strain (CARDIA)    Difficulty of Paying Living Expenses: Not very hard  Food Insecurity: No Food Insecurity (07/18/2023)   Hunger Vital Sign    Worried About Running Out of Food in the Last Year: Never true    Ran Out of Food in the Last Year: Never true  Transportation Needs: No Transportation Needs (07/18/2023)   PRAPARE - Scientist, research (physical sciences) (Medical): No    Lack of Transportation (Non-Medical): No  Physical Activity: Unknown (07/18/2023)   Exercise Vital Sign    Days of Exercise per Week: 5 days    Minutes of Exercise per Session: Patient declined  Stress: Stress Concern Present (07/18/2023)   [employer] of Occupational Health - Occupational Stress Questionnaire    Feeling of Stress: To some extent  Social Connections: Moderately Isolated (07/18/2023)   Social Connection and Isolation Panel    Frequency of Communication with Friends and Family: More than three times a week    Frequency of Social Gatherings with Friends and Family: Three times a week    Attends Religious Services: More than 4 times per year    Active Member of Clubs or Organizations: No    Attends Banker Meetings: Not on file  Marital Status: Separated    Review of Systems: Gen: Denies fever, chills, cold or flulike symptoms, presyncope, syncope. GI: See HPI Heme: See HPI  Physical Exam: BP 113/74 (BP Location: Right Arm, Patient Position: Sitting, Cuff Size: Normal)   Pulse 84   Temp 97.7 F (36.5 C) (Temporal)   Ht 5' (1.524 m)   Wt 126 lb 12.8 oz (57.5 kg)   BMI 24.76 kg/m  General:   Alert and oriented. No distress noted. Pleasant and cooperative.  Head:  Normocephalic and atraumatic. Eyes:  Conjuctiva clear without scleral icterus. Heart:  S1, S2 present without murmurs appreciated. Lungs:  Clear to auscultation bilaterally. Breath sounds are coarse. No distress.  Abdomen:  +BS, soft, non-tender and non-distended. No rebound or guarding. No HSM or masses noted. Msk:  Symmetrical without gross deformities. Normal posture. Extremities:  Without edema. Neurologic:  Alert and  oriented x4 Psych:  Normal mood and affect.    Assessment:  63 y.o. female with history of GERD, dyspepsia, recurrent H. pylori gastritis, cholecystectomy, adenomatous colon polyps, presenting today for follow-up of early satiety,  dysphagia, odynophagia, H pylori, s/p EGD.   Odynophagia/dysphagia: Resolved following empiric esophageal dilation on 07/04/2023.  No esophageal abnormality was noted.  Early satiety: Resolved following H. pylori treatment.  H. pylori: Patient has been treated for H. pylori numerous times daily for persistent infection or recurrence.  Initial diagnosis was in 2015.  She did have a negative breath test in November 2022.  Recurrence in December 2023.  Subsequent negative breath test in May 2024 following 2 courses of antibiotics, but again positive in April 2025.  She was treated with bismuth  quadruple therapy, but found to have persistent H. pylori at the time of her EGD on 07/04/2023.  She has now completed a 14-day course of levofloxacin  500 mg twice daily, amoxicillin  1000 mg twice daily, bismuth , pantoprazole  twice daily. Will pursue eradication testing after holding PPI for 14 days.  GERD: Doing well at this time using omeprazole  only as needed, prior to consuming known dietary trigger.   Plan:  Stop omeprazole  and avoid all other acid suppressing medications for the next 14 days, then complete H. pylori breath test. During this 14-day hold, advised that she can take over-the-counter Pepcid as needed, but not within 48 hours of H. pylori breath test. After H. pylori breath test is complete, would be okay to continue Pepcid or resume omeprazole  as needed. If H. pylori breath test is positive, we will need to refer her to ID for rifabutin based therapy. Follow-up in 6 months or sooner if needed.   Josette Centers, PA-C [facility] 08/29/2023

## 2023-08-29 ENCOUNTER — Encounter: Payer: Self-pay | Admitting: Gastroenterology

## 2023-08-29 ENCOUNTER — Ambulatory Visit (INDEPENDENT_AMBULATORY_CARE_PROVIDER_SITE_OTHER): Admitting: Gastroenterology

## 2023-08-29 VITALS — BP 113/74 | HR 84 | Temp 97.7°F | Ht 60.0 in | Wt 126.8 lb

## 2023-08-29 DIAGNOSIS — R1310 Dysphagia, unspecified: Secondary | ICD-10-CM

## 2023-08-29 DIAGNOSIS — A048 Other specified bacterial intestinal infections: Secondary | ICD-10-CM

## 2023-08-29 DIAGNOSIS — K219 Gastro-esophageal reflux disease without esophagitis: Secondary | ICD-10-CM

## 2023-08-29 DIAGNOSIS — R6881 Early satiety: Secondary | ICD-10-CM

## 2023-08-29 NOTE — Patient Instructions (Signed)
 Please complete H. pylori breath test at [facility] on August 11 or thereafter.  You will need to hold omeprazole  and all other acid suppression medications for the next 14 days prior to completing your breath test.  If you need something to help with reflux during this 14-day period, you can take famotidine (Pepcid) as needed, but do not take this within 48 hours of your breath test.  We will plan for follow-up in 6 months or sooner if needed.   Josette Centers, PA-C [facility]

## 2023-09-17 LAB — LIPID PANEL
Chol/HDL Ratio: 4.8 ratio — ABNORMAL HIGH (ref 0.0–4.4)
Cholesterol, Total: 234 mg/dL — ABNORMAL HIGH (ref 100–199)
HDL: 49 mg/dL (ref >39)
LDL Chol Calc (NIH): 171 mg/dL — ABNORMAL HIGH (ref 0–99)
Triglycerides: 81 mg/dL (ref 0–149)
VLDL Cholesterol Cal: 14 mg/dL (ref 5–40)

## 2023-09-17 LAB — HEMOGLOBIN A1C
Est. average glucose Bld gHb Est-mCnc: 120 mg/dL
Hgb A1c MFr Bld: 5.8 % — ABNORMAL HIGH (ref 4.8–5.6)

## 2023-09-17 LAB — H. PYLORI BREATH TEST: H pylori Breath Test: NEGATIVE

## 2023-09-17 LAB — TSH+FREE T4
Free T4: 1.2 ng/dL (ref 0.82–1.77)
TSH: 1.37 u[IU]/mL (ref 0.450–4.500)

## 2023-09-17 LAB — VITAMIN D 25 HYDROXY (VIT D DEFICIENCY, FRACTURES): Vit D, 25-Hydroxy: 26.9 ng/mL — ABNORMAL LOW (ref 30.0–100.0)

## 2023-09-19 ENCOUNTER — Encounter (HOSPITAL_BASED_OUTPATIENT_CLINIC_OR_DEPARTMENT_OTHER)

## 2023-09-19 ENCOUNTER — Ambulatory Visit: Payer: Self-pay | Admitting: Gastroenterology

## 2023-09-19 ENCOUNTER — Ambulatory Visit (INDEPENDENT_AMBULATORY_CARE_PROVIDER_SITE_OTHER): Admitting: Pulmonary Disease

## 2023-09-19 DIAGNOSIS — D869 Sarcoidosis, unspecified: Secondary | ICD-10-CM

## 2023-09-19 LAB — PULMONARY FUNCTION TEST
DL/VA % pred: 101 %
DL/VA: 4.41 ml/min/mmHg/L
DLCO unc % pred: 47 %
DLCO unc: 8.26 ml/min/mmHg
FEF 25-75 Post: 0.87 L/s
FEF 25-75 Pre: 0.62 L/s
FEF2575-%Change-Post: 40 %
FEF2575-%Pred-Post: 42 %
FEF2575-%Pred-Pre: 30 %
FEV1-%Change-Post: 2 %
FEV1-%Pred-Post: 43 %
FEV1-%Pred-Pre: 42 %
FEV1-Post: 0.92 L
FEV1-Pre: 0.9 L
FEV1FVC-%Change-Post: 6 %
FEV1FVC-%Pred-Pre: 100 %
FEV6-%Change-Post: -4 %
FEV6-%Pred-Post: 40 %
FEV6-%Pred-Pre: 42 %
FEV6-Post: 1.09 L
FEV6-Pre: 1.14 L
FEV6FVC-%Pred-Post: 103 %
FEV6FVC-%Pred-Pre: 103 %
FVC-%Change-Post: -3 %
FVC-%Pred-Post: 39 %
FVC-%Pred-Pre: 41 %
FVC-Post: 1.11 L
FVC-Pre: 1.15 L
Post FEV1/FVC ratio: 83 %
Post FEV6/FVC ratio: 100 %
Pre FEV1/FVC ratio: 78 %
Pre FEV6/FVC Ratio: 100 %
RV % pred: 51 %
RV: 0.94 L
TLC % pred: 48 %
TLC: 2.16 L

## 2023-09-19 NOTE — Patient Instructions (Signed)
 Full pft performed today

## 2023-09-19 NOTE — Progress Notes (Signed)
 Full pft performed today

## 2023-09-20 ENCOUNTER — Ambulatory Visit: Payer: Self-pay | Admitting: Family Medicine

## 2023-09-20 ENCOUNTER — Ambulatory Visit (HOSPITAL_BASED_OUTPATIENT_CLINIC_OR_DEPARTMENT_OTHER): Payer: Self-pay | Admitting: Pulmonary Disease

## 2023-09-20 DIAGNOSIS — E559 Vitamin D deficiency, unspecified: Secondary | ICD-10-CM

## 2023-09-20 MED ORDER — VITAMIN D (ERGOCALCIFEROL) 1.25 MG (50000 UNIT) PO CAPS: 50000.0000 [IU] | ORAL_CAPSULE | ORAL | 1 refills | Status: AC

## 2023-09-21 ENCOUNTER — Ambulatory Visit (HOSPITAL_BASED_OUTPATIENT_CLINIC_OR_DEPARTMENT_OTHER)
Admission: RE | Admit: 2023-09-21 | Discharge: 2023-09-21 | Disposition: A | Discharge status: Home / Self Care | Source: Ambulatory Visit | Attending: Pulmonary Disease | Admitting: Pulmonary Disease

## 2023-09-21 DIAGNOSIS — D869 Sarcoidosis, unspecified: Secondary | ICD-10-CM | POA: Insufficient documentation

## 2023-09-23 ENCOUNTER — Telehealth: Payer: Self-pay

## 2023-09-23 ENCOUNTER — Other Ambulatory Visit: Payer: Self-pay | Admitting: Gastroenterology

## 2023-09-23 DIAGNOSIS — K219 Gastro-esophageal reflux disease without esophagitis: Secondary | ICD-10-CM

## 2023-09-23 MED ORDER — PANTOPRAZOLE SODIUM 40 MG PO TBEC: 40.0000 mg | DELAYED_RELEASE_TABLET | Freq: Every day | ORAL | 3 refills | Status: DC | PRN

## 2023-09-23 NOTE — Telephone Encounter (Signed)
 Terri Newman,  Pt has phoned [name] but you seen pt last on 7/28 and 5/15. She states the pharmacy didn't have any pantoprazole  in stock. Pt was advised to call here

## 2023-09-23 NOTE — Telephone Encounter (Signed)
 Spoke with patient.  States she really is not having any problems, but prefers to have pantoprazole  on hand rather than OTC omeprazole  to use if needed for occasional heartburn.  I will send a prescription of pantoprazole  40 mg to her pharmacy.

## 2023-09-29 ENCOUNTER — Telehealth: Payer: Self-pay | Admitting: Pulmonary Disease

## 2023-09-29 ENCOUNTER — Telehealth: Payer: Self-pay | Admitting: Gastroenterology

## 2023-09-29 DIAGNOSIS — Z79899 Other long term (current) drug therapy: Secondary | ICD-10-CM

## 2023-09-29 DIAGNOSIS — D869 Sarcoidosis, unspecified: Secondary | ICD-10-CM

## 2023-09-29 NOTE — Telephone Encounter (Signed)
 Received request for medical records from [employer] [place] [facility].  Sent 09/29/23

## 2023-09-29 NOTE — Telephone Encounter (Signed)
 Hi Dr. Kassie-  Could we see if Humira  is approved for her before the upcoming visit?  I am not confident if infliximab will be approved again but can try. [employer]'s formulary is strict OR we could see if Acthar goes through their bridge program if you think this is appropriate for her  Aleck, our new clinical pharmacist, will look into this and get back to you but wanted to give my input  Sherry Pennant, PharmD, MPH, BCPS, CPP Clinical Pharmacist (Rheumatology and Pulmonology)

## 2023-09-30 ENCOUNTER — Other Ambulatory Visit: Payer: Self-pay

## 2023-09-30 ENCOUNTER — Other Ambulatory Visit (HOSPITAL_COMMUNITY): Payer: Self-pay

## 2023-09-30 MED ORDER — HUMIRA (2 PEN) 40 MG/0.4ML ~~LOC~~ AJKT
40.0000 mg | AUTO-INJECTOR | SUBCUTANEOUS | 0 refills | Status: DC
  Filled 2023-09-30: qty 2, 28d supply, fill #0

## 2023-09-30 NOTE — Telephone Encounter (Signed)
 Submitted a Prior Authorization request to [employer] for HUMIRA  via CoverMyMeds. Will update once we receive a response.  Key: [id number]

## 2023-09-30 NOTE — Telephone Encounter (Signed)
**Note De-identified  [place] Obfuscation** Please advise 

## 2023-09-30 NOTE — Telephone Encounter (Signed)
 Patient scheduled for Humira  new start appt on 10/06/2023. Patient denies any ongoing infection, antibiotic use, and denies upcoming surgeries.  Rx sent to [facility] for  onboarding. Patient aware that [facility] will be reaching out to coordinate shipment before appt  Treniya Lobb, PharmD, MPH, BCPS, CPP Clinical Pharmacist (Rheumatology and Pulmonology)

## 2023-09-30 NOTE — Telephone Encounter (Signed)
 Received notification from [employer] regarding a prior authorization for HUMIRA . Authorization has been APPROVED from 08/31/2023 to 01/31/2098(?).   Per test claim, copay for 28 days supply is $24  Patient can fill through [facility]: [phone]   Authorization # [id number]  Sherry Pennant, PharmD, MPH, BCPS, CPP Clinical Pharmacist (Rheumatology and Pulmonology)

## 2023-09-30 NOTE — Telephone Encounter (Signed)
 I have discussed results and plan on phone with patient. She has been approved for Humira  for sarcoid. Will start as soon as possible. I discussed that if her sarcoid symptoms progress, we may need to consider additional low dose agents (methotrexate , acthar, etc). After reviewing potential side effects of Humira  including risk of infection and malignancy, she is in agreement to plan to start.

## 2023-09-30 NOTE — Progress Notes (Signed)
 Specialty Pharmacy Initial Fill Coordination Note  Terri Newman is a 63 y.o. female contacted today regarding initial fill of specialty medication(s) Adalimumab  (Humira  (2 Pen))   Patient requested Courier to Provider Office   Delivery date: 10/05/23   Verified address: [address] [place], [place] [address]   Medication will be filled on 9/2.   Patient is aware of $24.00 copayment. CC info collected and forwarded to Tamra at [facility].

## 2023-10-04 ENCOUNTER — Other Ambulatory Visit: Payer: Self-pay

## 2023-10-06 ENCOUNTER — Ambulatory Visit: Payer: Self-pay | Admitting: Pulmonary Disease

## 2023-10-06 ENCOUNTER — Other Ambulatory Visit: Payer: Self-pay

## 2023-10-06 ENCOUNTER — Ambulatory Visit (INDEPENDENT_AMBULATORY_CARE_PROVIDER_SITE_OTHER)

## 2023-10-06 DIAGNOSIS — Z5181 Encounter for therapeutic drug level monitoring: Secondary | ICD-10-CM

## 2023-10-06 DIAGNOSIS — Z79899 Other long term (current) drug therapy: Secondary | ICD-10-CM | POA: Diagnosis not present

## 2023-10-06 DIAGNOSIS — D84821 Immunodeficiency due to drugs: Secondary | ICD-10-CM | POA: Diagnosis not present

## 2023-10-06 DIAGNOSIS — D869 Sarcoidosis, unspecified: Secondary | ICD-10-CM | POA: Diagnosis not present

## 2023-10-06 LAB — COMPREHENSIVE METABOLIC PANEL WITH GFR
ALT: 14 U/L (ref 0–35)
AST: 17 U/L (ref 0–37)
Albumin: 3.7 g/dL (ref 3.5–5.2)
Alkaline Phosphatase: 69 U/L (ref 39–117)
BUN: 13 mg/dL (ref 6–23)
CO2: 32 meq/L (ref 19–32)
Calcium: 9.6 mg/dL (ref 8.4–10.5)
Chloride: 101 meq/L (ref 96–112)
Creatinine, Ser: 0.89 mg/dL (ref 0.40–1.20)
GFR: 69.53 mL/min (ref >60.00)
Glucose, Bld: 85 mg/dL (ref 70–99)
Potassium: 3.9 meq/L (ref 3.5–5.1)
Sodium: 140 meq/L (ref 135–145)
Total Bilirubin: 0.4 mg/dL (ref 0.2–1.2)
Total Protein: 7.4 g/dL (ref 6.0–8.3)

## 2023-10-06 LAB — CBC WITH DIFFERENTIAL/PLATELET
Basophils Absolute: 0 K/uL (ref 0.0–0.1)
Basophils Relative: 0.3 % (ref 0.0–3.0)
Eosinophils Absolute: 0.1 K/uL (ref 0.0–0.7)
Eosinophils Relative: 1.1 % (ref 0.0–5.0)
HCT: 39.8 % (ref 36.0–46.0)
Hemoglobin: 13.2 g/dL (ref 12.0–15.0)
Lymphocytes Relative: 26.1 % (ref 12.0–46.0)
Lymphs Abs: 2.6 K/uL (ref 0.7–4.0)
MCHC: 33 g/dL (ref 30.0–36.0)
MCV: 88.8 fl (ref 78.0–100.0)
Monocytes Absolute: 0.5 K/uL (ref 0.1–1.0)
Monocytes Relative: 5.2 % (ref 3.0–12.0)
Neutro Abs: 6.7 K/uL (ref 1.4–7.7)
Neutrophils Relative %: 67.3 % (ref 43.0–77.0)
Platelets: 293 K/uL (ref 150.0–400.0)
RBC: 4.49 Mil/uL (ref 3.87–5.11)
RDW: 15.1 % (ref 11.5–15.5)
WBC: 9.9 K/uL (ref 4.0–10.5)

## 2023-10-06 MED ORDER — HUMIRA (2 PEN) 40 MG/0.4ML ~~LOC~~ AJKT
40.0000 mg | AUTO-INJECTOR | SUBCUTANEOUS | 3 refills | Status: DC
Start: 2023-10-06 — End: 2023-11-22
  Filled 2023-10-06 – 2023-10-27 (×2): qty 0.8, 28d supply, fill #0
  Filled 2023-11-22: qty 0.8, 28d supply, fill #1

## 2023-10-06 NOTE — Patient Instructions (Signed)
 Your next Humira  dose is due on 10/20/23 and every 14 days thereafter  Your prescription will be shipped from [facility]. Their phone number is [phone].   Someone from the pharmacy will call to schedule shipment and confirm address. They will mail your medication to your home.  You will need to be seen by Dr. Kassie to assess how Humira  is working for you. Please keep your follow-up appointment scheduled on 10/31/23. Call our clinic if you need to make this appointment.  Stay up to date on all routine vaccines: influenza, pneumonia, COVID19, Shingles.   How to manage an injection site reaction: Remember the 4 C's: COUNTER - leave on the counter at least 30 minutes but up to overnight to bring medication to room temperature. This may help prevent stinging COLD - place something cold (like an ice gel pack or cold water  bottle) on the injection site just before cleansing with alcohol. This may help reduce pain CLARITIN - use Claritin (generic name is loratadine) for the first two weeks of treatment or the day of, the day before, and the day after injecting. This will help to minimize injection site reactions CALL ME - if injection site reaction is bigger than the size of your fist, looks infected, blisters, or if you develop hives

## 2023-10-06 NOTE — Telephone Encounter (Signed)
 Patient seen for new start Humira  today (10/06/23). Tolerated well. Humira  maintenance Rx sent to [facility]. Patient went to lab today for baseline labs (CBC, CMP, TB Gold). She plans to return to lab in 1 month for repeat lab monitoring.   See clinical support note for more details.  Aleck Puls, PharmD, BCPS Clinical Pharmacist  [facility]

## 2023-10-06 NOTE — Progress Notes (Unsigned)
 Pharmacy Note Subjective: Patient presents today to [facility] for new start Humira . 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 Diagnosed with sarcoidosis in 2013 via mediastinoscopy at [facility]. Treated with prednisone  for 8 months at that time. Prior therapies includes: methotrexate  and Cellcept  (intolerance). Insurance previously denied infliximab.  Dr. Kassie discussed plan to start Humira  with patient - see results follow-up phone note 09/20/23.   Patient denies upcoming surgeries.   Diagnosis of heart failure: No  Prior therapies:  - Methotrexate  (mouth sores on 20mg  weekly, skin sensitivitiy, nausea, cough worse) - CellCept  (discontinued 05/2020 due to GI symptoms, currently followed by [facility])   Objective:  CBC    Component Value Date/Time   WBC 7.7 04/22/2023 1552   WBC 14.6 (H) 03/22/2023 0636   RBC 3.92 04/22/2023 1552   RBC 4.07 03/22/2023 0636   HGB 12.0 04/22/2023 1552   HCT 35.9 04/22/2023 1552   HCT 37 08/05/2017 0746   PLT 248 04/22/2023 1552   MCV 92 04/22/2023 1552   MCV 88.6 08/05/2017 0746   MCH 30.6 04/22/2023 1552   MCH 31.9 03/22/2023 0636   MCHC 33.4 04/22/2023 1552   MCHC 33.2 03/22/2023 0636   RDW 11.9 04/22/2023 1552   RDW 12.6 08/05/2017 0746   LYMPHSABS 2.0 04/22/2023 1552   MONOABS 0.8 03/22/2023 0636   EOSABS 0.2 04/22/2023 1552   EOSABS 80 08/05/2017 0746   BASOSABS 0.0 04/22/2023 1552     CMP     Component Value Date/Time   NA 139 05/12/2023 1613   NA 138 08/05/2017 0746   K 4.6 05/12/2023 1613   K 4.5 08/05/2017 0746   CL 103 05/12/2023 1613   CL 104 08/05/2017 0746   CO2 23 05/12/2023 1613   CO2 29 08/05/2017 0746   GLUCOSE 97 05/12/2023 1613   GLUCOSE 99 03/22/2023 0636   BUN 9 05/12/2023 1613   CREATININE 0.79 05/12/2023 1613   CREATININE 0.80 09/07/2019 0855   CALCIUM 9.5 05/12/2023 1613   CALCIUM 9.5 08/05/2017 0746   PROT 7.0 05/12/2023 1613   PROT 7.0 08/05/2017  0746   ALBUMIN 4.1 05/12/2023 1613   AST 16 05/12/2023 1613   AST 13 08/05/2017 0746   ALT 12 05/12/2023 1613   ALT 12 08/05/2017 0746   ALKPHOS 79 05/12/2023 1613   ALKPHOS 75 08/05/2017 0746   BILITOT 0.2 05/12/2023 1613   BILITOT 0.4 08/05/2017 0746   GFRNONAA >60 03/22/2023 0636   GFRNONAA 81 09/07/2019 0855   GFRAA 94 09/07/2019 0855      Baseline Immunosuppressant Therapy Labs TB GOLD    Latest Ref Rng & Units 07/12/2022    2:19 PM  Quantiferon TB Gold  Quantiferon TB Gold Plus Negative Negative    Hepatitis Panel    Latest Ref Rng & Units 07/12/2022    2:19 PM  Hepatitis  Hep B Surface Ag Negative Negative   Hep B IgM Negative Negative    HIV Lab Results  Component Value Date   HIV Non Reactive 09/25/2021   Chest x-ray: ***   Assessment /Plan  Injection training for adaliumab (Humira )  Goals of therapy: Mechanism: ***  Counseled patient that Humira  is a TNF blocking agent.  Counseled patient on purpose, proper use, and adverse effects of Humira .  Reviewed the most common adverse effects including infections, headache, and injection site reactions. Discussed that there is the possibility of an increased risk of malignancy including non-melanoma skin cancer.  Advised  patient to get yearly dermatology exams due to risk of skin cancer. Counseled patient that Humira  should be held prior to scheduled surgery.  Counseled patient to avoid live vaccines while on Humira .  Recommend annual influenza, PCV 15 or PCV20 or Pneumovax 23, and Shingrix as indicated.  Reviewed the importance of regular labs while on Humira  therapy. Will monitor CBC and CMP 1 month after starting and then every 3 months routinely thereafter. Will monitor TB gold annually. Standing orders placed. Provided patient with medication education material and answered all questions.  Patient consented to Humira .  Reviewed storage instructions of Humira .  Advised initial injection must be administered in  office.  Patient verbalized   Side effects: injection site reaction (6-18%), ***  Dose: ***  Administration/Storage:  Reviewed administration sites of thigh or abdomen (at least 2-3 inches away from abdomen). Reviewed the upper arm is only appropriate if caregiver is administering injection  Do not shake pen/syringe as this could lead to product foaming or precipitation. Do not use if solution is discolored or contains particulate matter or if window on prefilled pen is yellow (indicates pen has been used).  Reviewed storage of medication in refrigerator. Reviewed that Dupixent can be stored at room temperature in unopened carton for up to 14 days.  Access: Approval of Humira  through: {specialtycoverage:25706}  Patient self-administered *** in left lower abdomen and using [facility]-supplied medication.  *** autoinjector pen NDC [id number] LOT [id number] EXP 2026-11  Patient monitored for 30 minutes for adverse reaction.  Patient tolerated well. No swelling or redness noted.  Medication Reconciliation  A drug regimen assessment was performed, including review of allergies, interactions, disease-state management, dosing and immunization history. Medications were reviewed with the patient, including name, instructions, indication, goals of therapy, potential side effects, importance of adherence, and safe use.  Drug interaction(s): none noted   PLAN Continue Humira  40mg  Alexander every 14 days.  Next dose is due *** and every 14 days thereafter. Rx sent to: [facility]: [phone] .  Patient provided with pharmacy phone number.  Patient going to lab after visit today for baseline CBC, CMP, and yearly TB Gold. Next labs (CBC, CMP) due in 1 month (patient prefers to go to [place] in [place], lab ordered ***).  Dermatology referral was placed today.  All questions encouraged and answered.  Instructed patient to reach out with any further questions or concerns.  Thank you  for allowing pharmacy to participate in this patient's care.  This appointment required 45 minutes of patient care (this includes precharting, chart review, review of results, face-to-face care, etc.).

## 2023-10-07 NOTE — Progress Notes (Signed)
 Patient was seen in office on 10/06/23 for new start Humira . She was able to self-inject. Tolerated well.   PLAN Continue Humira  40mg   every 14 days.  Next dose is due 10/20/23 and every 14 days thereafter. Rx sent to: [facility]: [phone] .  Patient provided with pharmacy phone number.  Patient going to lab after visit today for baseline CBC, CMP, and yearly TB Gold. Next labs (CBC, CMP) due in 1 month (patient prefers to go to [facility] in [place], lab ordered).  Dermatology referral was placed today. Next OV with Dr. Kassie on 10/31/23.  Patient was provided handout with education on Humira .    All questions encouraged and answered.  Instructed patient to reach out with any further questions or concerns.  Aleck Puls, PharmD, BCPS Clinical Pharmacist  [facility]

## 2023-10-09 LAB — QUANTIFERON-TB GOLD PLUS
Mitogen-NIL: 8.38 [IU]/mL
NIL: 0.03 [IU]/mL
QuantiFERON-TB Gold Plus: NEGATIVE
TB1-NIL: 0 [IU]/mL
TB2-NIL: 0 [IU]/mL

## 2023-10-27 ENCOUNTER — Other Ambulatory Visit (HOSPITAL_COMMUNITY): Payer: Self-pay

## 2023-10-27 ENCOUNTER — Other Ambulatory Visit: Payer: Self-pay

## 2023-10-27 NOTE — Progress Notes (Signed)
 Specialty Pharmacy Refill Coordination Note  Spoke with Terri Newman  Terri Newman is a 63 y.o. female contacted today regarding refills of specialty medication(s) Adalimumab  (Humira  (2 Pen))  Doses on hand: 0  Injection date: 11/03/23   Patient requested: Delivery   Delivery date: 11/01/23   Verified address: [address] [place] [address]  Medication will be filled on 10/31/23. [employer]

## 2023-10-31 ENCOUNTER — Ambulatory Visit (HOSPITAL_BASED_OUTPATIENT_CLINIC_OR_DEPARTMENT_OTHER): Admitting: Pulmonary Disease

## 2023-10-31 ENCOUNTER — Encounter (HOSPITAL_BASED_OUTPATIENT_CLINIC_OR_DEPARTMENT_OTHER): Payer: Self-pay | Admitting: Pulmonary Disease

## 2023-10-31 ENCOUNTER — Telehealth (HOSPITAL_BASED_OUTPATIENT_CLINIC_OR_DEPARTMENT_OTHER): Payer: Self-pay | Admitting: Pulmonary Disease

## 2023-10-31 ENCOUNTER — Other Ambulatory Visit: Payer: Self-pay

## 2023-10-31 VITALS — BP 125/81 | HR 77 | Ht 60.0 in | Wt 129.2 lb

## 2023-10-31 DIAGNOSIS — D869 Sarcoidosis, unspecified: Secondary | ICD-10-CM

## 2023-10-31 DIAGNOSIS — Z79899 Other long term (current) drug therapy: Secondary | ICD-10-CM

## 2023-10-31 MED ORDER — ALBUTEROL SULFATE HFA 108 (90 BASE) MCG/ACT IN AERS: 1.0000 | INHALATION_SPRAY | RESPIRATORY_TRACT | 3 refills | Status: AC | PRN

## 2023-10-31 MED ORDER — FLUTICASONE-SALMETEROL 115-21 MCG/ACT IN AERO: 2.0000 | INHALATION_SPRAY | Freq: Two times a day (BID) | RESPIRATORY_TRACT | 11 refills | Status: AC

## 2023-10-31 NOTE — Telephone Encounter (Signed)
 Pt is a little concerned and worried about what you told her today as she is being referred to [facility]. She also wants to know if the plan is to continue Humira  or switch to something else? She states she didn't think about these things when she is in for visit

## 2023-10-31 NOTE — Patient Instructions (Addendum)
 CONTINUE Humira  40 mg every 2 weeks - Repeat CT Chest in March 2026 - Repeat pulmonary function test March 2026 - Please call if [facility] has not scheduled appointment with you in one month

## 2023-10-31 NOTE — Progress Notes (Signed)
 Subjective:   PATIENT ID: Terri Newman GENDER: female DOB: Dec 18, 1960, MRN: [id number]   HPI  Chief Complaint  Patient presents with   Follow-up    Sarcoidosis    Reason for Visit: Follow-up sarcoid  [name] is a 63 year old female with sarcoidosis, GERD, hx H. Pylori who presents for follow-up.  01/09/21 She was previously seen by Dr. Darlean since 01/2020 for sarcoid management. Was followed by Terri Newman before this. She was diagnosed with sarcoidosis in 2013 via mediastinoscopy at [facility]. She was treated with prednisone  for 8 months with no recurrence off steroids. Cough was her primary symptoms with sarcoid flare. She reports fatigue but still able to work (lift boxes, walking frequently)  2022 - chronic nonproductive cough for last five months. GI work-up was negative including neg H.pylori. Recent cardiac work-up also neg. Cough does not interfere with her activities. She reports fatigue but attributes this to sleeping five hours a night. 2023 - Prednisone  10 mg x 3 months. Coughing has improved but not resolved.Increased to prednisone  20 mg. Has tolerated well except for weight gain (12lb). Weaned back to prednisone  10 mg daily. Tolerating well. Has some shortness of breath that requires albuterol  once a month. Weaned off steroids in December 2024 - Started on Advair with improvement in cough. Fatigue has worsened. Despite starting methotrexate , no change in fatigue and suffering from nausea and mouth sores. Methotrexate  held in July and restarted in August. Mouth sores/pain recurred in Sept. Cough worsening. Methotrexate  discontinued and restarted on prednisone  20 mg daily with improvement in fatigue. 2025 Infliximab was denied so started on cellcept  at in Feb and weaned off prednisone . Cellcept  discontinued in April due to GI symptoms. Not on any immunosuppressants during GI work-up which was +H pylori. Started on Humira  in Sept.  10/31/23 Since our last visit we had  discontinued cellcept . She had esophageal dilation on 07/04/23 with improvement in dysphagia and was diagnosed with H pylori gastritis and completed treatment for levofloxacin , amoxicillin , bismuth  and PPI. Infection has cleared and abdominal pain resolved. She was started on Humira  on 10/06/23. Fatigue is present but improved now that infection has cleared and after coming off prednisone . Has shortness of breath, cough and wheezing every other day. Has lost 20 lbs since being off prednisone .  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 pylori infection 09/2019   Treated with bismuth  subsalicylate, tetracycline , metronidazole , and PPI twice daily x14 days.  Documented eradication November 2022 via breath test.   H. pylori infection 01/2022   Confirmed eradication in May 2024.   H. pylori infection 05/2023   Persistent infection noted at the time of EGD 07/04/2023   Helicobacter pylori gastritis 08/2013   ABO  BID FOR 10 DAYS   Neuromuscular disorder (HCC)    chronic back pain   PONV (postoperative nausea and vomiting) 11/07/2012   Sarcoidosis    Sarcoidosis      Family History  Problem Relation Age of Onset   Heart attack Mother    Alcohol abuse Mother    Heart attack Father    Alcohol abuse Father    Colon cancer Neg Hx      Social History   Occupational  History   Occupation: Surveyor, minerals: makes ranch dressing, pasta sauce, salsa, and other food produces   Occupation: Insurance account manager  Tobacco Use   Smoking status: Never    Passive exposure: Never   Smokeless tobacco: Never  Vaping Use   Vaping status: Never Used  Substance and Sexual Activity   Alcohol use: No   Drug use: No    Sexual activity: Not Currently    No Known Allergies   Outpatient Medications Prior to Visit  Medication Sig Dispense Refill   adalimumab  (HUMIRA , 2 PEN,) 40 MG/0.4ML pen Inject 0.4 mLs (40 mg total) into the skin every 14 (fourteen) days. 0.8 mL 3   pantoprazole  (PROTONIX ) 40 MG tablet Take 1 tablet (40 mg total) by mouth daily as needed. 30 tablet 3   Vitamin D , Ergocalciferol , (DRISDOL ) 1.25 MG (50000 UNIT) CAPS capsule Take 1 capsule (50,000 Units total) by mouth every 7 (seven) days. 27 capsule 1   fluticasone -salmeterol (ADVAIR HFA) 115-21 MCG/ACT inhaler Inhale 2 puffs into the lungs 2 (two) times daily. 1 each 5   No facility-administered medications prior to visit.    Review of Systems  Constitutional:  Positive for malaise/fatigue. Negative for chills, diaphoresis, fever and weight loss.  HENT:  Negative for congestion.   Respiratory:  Positive for cough, shortness of breath and wheezing. Negative for hemoptysis and sputum production.   Cardiovascular:  Negative for chest pain, palpitations and leg swelling.     Objective:   Vitals:   10/31/23 0903  BP: 125/81  Pulse: 77  SpO2: 96%  Weight: 129 lb 3.2 oz (58.6 kg)  Height: 5' (1.524 m)   SpO2: 96 %   Physical Exam: General: Well-appearing, no acute distress HENT: [place], AT Eyes: EOMI, no scleral icterus Respiratory: Bibasilar inspiratory crackles. No wheezing or rhonchi Cardiovascular: RRR, -M/R/G, no JVD Extremities:-Edema,-tenderness Neuro: AAO x4, CNII-XII grossly intact Psych: Normal mood, normal affect  Data Reviewed:  Imaging: CXR 10/15/20 - Normal. No adenopathy. No infiltrate effusion or edema PET/CT 03/10/21 - Normal cardiac perfusion and function. Hypermetabolic activity in mediastinal adenopathy and bilateral lower lobe opacities. No pleural effusions. PET/CT 12/03/21 - Low-level avidity, nonspecific. No hypermetabolic activity in adenopathy. Interval visualization of mild pulmonary fibrosis with  apical to basal gradient with associated ground glass and some traction bronchiectasis. No honeycombing. CT Chest 06/17/22 - Mild worsened fibrosis. Subpleural GGO and reticulation bilaterally with moderate traction bronchiectasis CT Chest 01/14/23 - Subpleural reticulations and ground glass fibrosis and traction bronchiectasis L>R, in interval progression compared to May 2024 CT Chest 09/21/23 - Subpleural reticulations with traction bronchiectasis and GGO  PFT: 01/21/14 FVC 2.65 (84%) FEV1 2.36 (95%) Ratio 86  TLC 92% DLCO 82% Interpretation: Normal spirometry  02/10/21 FVC 1.72 (60%) FEV1 1.55 (70%) Ratio 88 TLC 76% DLCO 54% Interpretation: Mild restrictive lung defect with moderately reduced diffusing capacity. No significant bronchodilator response. Small air disease present.   11/15/22 FVC 1.46 (51%) FEV1 1.27 (58%) Ratio 87  TLC 62% DLCO 67% Interpretation: Moderately severe restrictive defect with mildly reduced diffusing capacity.  10/31/23 FVC 1.11 (39%) FEV1 0.92 (43%) Ratio 83  TLC 48% DLCO 47% Interpretation: Severe restrictive defect with moderately reduced diffusing capacity    Labs:    Latest Ref Rng & Units 10/06/2023    9:20 AM 04/22/2023    3:52 PM 04/08/2023    3:58 PM  CBC  WBC 4.0 - 10.5 K/uL 9.9  7.7  8.0   Hemoglobin 12.0 - 15.0  g/dL 86.7  87.9  87.7   Hematocrit 36.0 - 46.0 % 39.8  35.9  36.8   Platelets 150.0 - 400.0 K/uL 293.0  248  334   WBC increased      Latest Ref Rng & Units 10/06/2023    9:20 AM 05/12/2023    4:13 PM 04/22/2023    3:52 PM  CMP  Glucose 70 - 99 mg/dL 85  97  89   BUN 6 - 23 mg/dL 13  9  8    Creatinine 0.40 - 1.20 mg/dL 9.10  9.20  9.14   Sodium 135 - 145 mEq/L 140  139  141   Potassium 3.5 - 5.1 mEq/L 3.9  4.6  4.4   Chloride 96 - 112 mEq/L 101  103  104   CO2 19 - 32 mEq/L 32  23  21   Calcium 8.4 - 10.5 mg/dL 9.6  9.5  9.3   Total Protein 6.0 - 8.3 g/dL 7.4  7.0  6.5   Total Bilirubin 0.2 - 1.2 mg/dL 0.4  0.2  <9.7   Alkaline  Phos 39 - 117 U/L 69  79  79   AST 0 - 37 U/L 17  16  21    ALT 0 - 35 U/L 14  12  19    Overall electrolytes normal with mildly decreased sodium  EKG: 01/02/21 NSR  Assessment & Plan:   Discussion: 63 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 Prolonged low dose prednisone  use in 2023 with progression of pulmonary symptoms on CT and PFTs in 2024. Intolerance to low dose methotrexate  and cellcept  due to abdominal pain, nausea and mouth sores. Insurance denied infliximab but did approve Humira  in 10/2023. Currently tolerating  Pulmonary sarcoidosis with mild restrictive defect and reduced DLCO - Dx in 2013 via mediastinoscopy - S/p steroids 2013. Hx of side effects (agitation, sleep, weight gain) - Annual PFTs. Last PFTs 02/2021 - Recommend annual ophthalmology exam.  Last visit unknown - EKG  reviewed. No evidence of conduction abnormalities.  - PET/CT 03/12/21 with active pulmonary sarcoid - PET/CT 12/03/21 - low-level avidity. Improved flare. Interval mild fibrosis seen L>R - CT Chest 06/17/22 - mildly worsening fibrosis - Repeat CT Chest in March 2026 - Repeat pulmonary function test March 2026  High risk medical management Prednisone  10 mg 03/18/21-06/16/21 Prednisone  20 mg 06/16/21- 09/21/21 Prednisone  10 mg 09/21/21- 01/21/22 Methotrexate  7.5 mg 6/30-7/13/24 Methotrexate  10 mg 7/14-7/16/24 due to abdominal pain Methotrexate  20 mg 10/26/22 discontinued Prednisone  20 mg daily on 10/31/22 Prednisone  to 5 mg 03/30/23 Cellcept  started 02/28/23 Cellcept   discontinued 05/24/23 Humira  started 10/06/23 --CONTINUE Humira  40 mg every 2 weeks  Chronic cough 2/2 sarcoid +/- fibrosis Etiology of fibrosis could be from post-viral/inflammatory process, prior sarcoid flare --CONTINUE Advair 115-21 mcg TWO puffs in the morning and evening. Rinse mouth out after use to prevent  thrush --CONTINUE Albuterol  AS NEEDED    Health Maintenance Immunization History  Administered Date(s) Administered   Influenza Inj Mdck Quad Pf 11/22/2020   Influenza Split 12/04/2019   Influenza, Seasonal, Injecte, Preservative Fre 02/20/2016, 03/21/2023   Influenza,inj,Quad PF,6+ Mos 12/08/2016, 11/08/2018   Influenza,inj,quad, With Preservative 11/28/2017   Influenza-Unspecified 01/16/2014, 12/03/2014, 12/03/2019, 11/22/2020   Pneumococcal Polysaccharide-23 02/27/2013   Tdap 04/15/2021, 06/01/2021   CT Lung Screen - never smoker  Orders Placed This Encounter  Procedures   CT Chest Wo Contrast    Standing  Status:   Future    Expiration Date:   10/30/2024    Scheduling Instructions:     March 2026    Preferred imaging location?:   [facility]   Ambulatory referral to Pulmonology    Referral Priority:   Routine    Referral Type:   Consultation    Referral Reason:   Specialty Services Required    Requested Specialty:   Pulmonary Disease    Number of Visits Requested:   1   Pulmonary function test    Standing Status:   Future    Expiration Date:   10/30/2024    Where should this test be performed?:   Outpatient Pulmonary    What type of PFT is being ordered?:   Full PFT w/o Spirometry post Bronchodilator   Meds ordered this encounter  Medications   fluticasone -salmeterol (ADVAIR HFA) 115-21 MCG/ACT inhaler    Sig: Inhale 2 puffs into the lungs 2 (two) times daily.    Dispense:  1 each    Refill:  11    Generic ok   albuterol  (VENTOLIN  HFA) 108 (90 Base) MCG/ACT inhaler    Sig: Inhale 1-2 puffs into the lungs every 4 (four) hours as needed for wheezing or shortness of breath.    Dispense:  1 each    Refill:  3   Return for after PFT in March.   I have spent a total time of 45-minutes on the day of the appointment including chart review, data review, collecting history, coordinating care and discussing medical diagnosis and plan with the patient/family. Past  medical history, allergies, medications were reviewed. Pertinent imaging, labs and tests included in this note have been reviewed and interpreted independently by me.  Azzure Garabedian Slater Staff, MD [facility] 10/31/2023 9:29 AM

## 2023-11-03 ENCOUNTER — Ambulatory Visit (HOSPITAL_BASED_OUTPATIENT_CLINIC_OR_DEPARTMENT_OTHER): Payer: Self-pay | Admitting: Pulmonary Disease

## 2023-11-03 NOTE — Telephone Encounter (Signed)
Medication interaction question

## 2023-11-03 NOTE — Telephone Encounter (Signed)
 FYI Only or Action Required?: Action required by provider: clinical question for provider.  Patient is followed in Pulmonology for sarcoidosis, last seen on 10/31/2023 by Kassie Acquanetta Bradley, MD.  Called Nurse Triage reporting Medication Problem.  Triage Disposition: Call PCP Now  Patient/caregiver understands and will follow disposition?: Yes        Copied from CRM [id number]. Topic: Clinical - Medical Advice >> Nov 03, 2023  3:57 PM Joesph PARAS wrote: Reason for CRM: Patient is calling to inquire. States has strep throat and also takes Humira  inhaler. Patient would like to know if OK to take Amoxicillin  while also on inhaler. Please advise patient.        Reason for Disposition  [1] Caller has URGENT medicine question about med that primary care doctor (or NP/PA) or specialist prescribed AND [2] triager unable to answer question  Answer Assessment - Initial Assessment Questions Patient had her Humira  shot today and then was later diagnosed with strep throat and prescribed Amoxicillin . Patient would like to know if it is okay to take the Amoxicillin  with the Humira . Please advise.      1. NAME of MEDICINE: What medicine(s) are you calling about?     Humira  and Amoxicillin   2. QUESTION: What is your question? (e.g., double dose of medicine, side effect)     Can she take the Amoxicillin  with the Humira   Protocols used: Medication Question Call-A-AH

## 2023-11-03 NOTE — Telephone Encounter (Signed)
 Patient should HOLD Humira  while on antibiotics or if she has an active infection. She can resume Humira  once antibiotic course is completed and symptoms have fully resolved and   Sherry Pennant, PharmD, MPH, BCPS, CPP Clinical Pharmacist [facility])

## 2023-11-04 NOTE — Telephone Encounter (Signed)
**Note De-identified  [place] Obfuscation** Please advise 

## 2023-11-07 ENCOUNTER — Other Ambulatory Visit: Payer: Self-pay

## 2023-11-07 DIAGNOSIS — Z5181 Encounter for therapeutic drug level monitoring: Secondary | ICD-10-CM

## 2023-11-14 ENCOUNTER — Ambulatory Visit: Payer: Self-pay

## 2023-11-14 NOTE — Telephone Encounter (Signed)
 Labs are in

## 2023-11-14 NOTE — Telephone Encounter (Signed)
 Patient calling to confirm lab orders are in , I do see two labs for CMET and a CBC ordered on 10/6;  calling CAL to confirm if labs are in as she is heading to the office now for the blood work.          Copied from CRM [id number]. Topic: Clinical - Request for Lab/Test Order >> Nov 14, 2023  9:26 AM Nathanel DEL wrote: Reason for CRM: pt wants to confirm her labs are ordered for lab in [place].  I do not see any orders from Dr Kassie

## 2023-11-15 LAB — COMPREHENSIVE METABOLIC PANEL WITH GFR
ALT: 12 IU/L (ref 0–32)
AST: 20 IU/L (ref 0–40)
Albumin: 3.8 g/dL — ABNORMAL LOW (ref 3.9–4.9)
Alkaline Phosphatase: 69 IU/L (ref 49–135)
BUN/Creatinine Ratio: 13 (ref 12–28)
BUN: 11 mg/dL (ref 8–27)
Bilirubin Total: 0.3 mg/dL (ref 0.0–1.2)
CO2: 24 mmol/L (ref 20–29)
Calcium: 9.6 mg/dL (ref 8.7–10.3)
Chloride: 104 mmol/L (ref 96–106)
Creatinine, Ser: 0.87 mg/dL (ref 0.57–1.00)
Globulin, Total: 3.2 g/dL (ref 1.5–4.5)
Glucose: 89 mg/dL (ref 70–99)
Potassium: 4.7 mmol/L (ref 3.5–5.2)
Sodium: 140 mmol/L (ref 134–144)
Total Protein: 7 g/dL (ref 6.0–8.5)
eGFR: 75 mL/min/1.73 (ref >59)

## 2023-11-15 LAB — CBC WITH DIFFERENTIAL/PLATELET
Basophils Absolute: 0 x10E3/uL (ref 0.0–0.2)
Basos: 0 %
EOS (ABSOLUTE): 0.2 x10E3/uL (ref 0.0–0.4)
Eos: 3 %
Hematocrit: 41.2 % (ref 34.0–46.6)
Hemoglobin: 13 g/dL (ref 11.1–15.9)
Immature Grans (Abs): 0 x10E3/uL (ref 0.0–0.1)
Immature Granulocytes: 0 %
Lymphocytes Absolute: 1.7 x10E3/uL (ref 0.7–3.1)
Lymphs: 29 %
MCH: 28.8 pg (ref 26.6–33.0)
MCHC: 31.6 g/dL (ref 31.5–35.7)
MCV: 91 fL (ref 79–97)
Monocytes Absolute: 0.4 x10E3/uL (ref 0.1–0.9)
Monocytes: 7 %
Neutrophils Absolute: 3.7 x10E3/uL (ref 1.4–7.0)
Neutrophils: 61 %
Platelets: 284 x10E3/uL (ref 150–450)
RBC: 4.51 x10E6/uL (ref 3.77–5.28)
RDW: 14 % (ref 11.7–15.4)
WBC: 6.1 x10E3/uL (ref 3.4–10.8)

## 2023-11-21 ENCOUNTER — Encounter (INDEPENDENT_AMBULATORY_CARE_PROVIDER_SITE_OTHER): Payer: Self-pay

## 2023-11-22 ENCOUNTER — Other Ambulatory Visit: Payer: Self-pay

## 2023-11-22 ENCOUNTER — Other Ambulatory Visit (HOSPITAL_COMMUNITY): Payer: Self-pay

## 2023-11-22 ENCOUNTER — Telehealth: Payer: Self-pay

## 2023-11-22 DIAGNOSIS — Z79899 Other long term (current) drug therapy: Secondary | ICD-10-CM

## 2023-11-22 DIAGNOSIS — D869 Sarcoidosis, unspecified: Secondary | ICD-10-CM

## 2023-11-22 MED ORDER — HUMIRA (2 PEN) 40 MG/0.4ML ~~LOC~~ AJKT
40.0000 mg | AUTO-INJECTOR | SUBCUTANEOUS | 3 refills | Status: AC
Start: 2023-11-22 — End: ?

## 2023-11-22 NOTE — Telephone Encounter (Signed)
 Rx triaged to [facility].

## 2023-11-22 NOTE — Progress Notes (Signed)
 Pt has reached max allotted fills through out of network pharmacy, must fill through [facility] now. New Rx will be sent to them, pt will be disenrolled from specialty pharmacy services

## 2023-11-22 NOTE — Telephone Encounter (Signed)
 Received notification from [facility] specialty pharmacy that pt's copay for Humira  has jumped up from $24 to over $6000. Reached out to [employer] at [phone] and was advised that the pt has reached her maximum amount of fills at a local pharmacy. Rx will have to be sent to [facility] and they can assist if there are any additional issues with the copay.  Reached out to the pt and advised her she will have to fill with [facility] going forward. Gave her the phone number to reach out to them [phone] and make sure they got the rx and won't have any issues. Also gave her my direct line if any issues arise. She is due for her next dose on 10/30. Advised her to reach out to me if she's having trouble with them on Thursday or Friday. Pt verbalized her understanding.

## 2023-11-30 ENCOUNTER — Other Ambulatory Visit: Payer: Self-pay | Admitting: Gastroenterology

## 2023-11-30 DIAGNOSIS — K219 Gastro-esophageal reflux disease without esophagitis: Secondary | ICD-10-CM

## 2023-11-30 NOTE — Telephone Encounter (Signed)
 Phoned and advised the pt of Rx being sent to her pharmacy with one additional refill. Pt expressed understanding

## 2023-12-20 ENCOUNTER — Ambulatory Visit (HOSPITAL_BASED_OUTPATIENT_CLINIC_OR_DEPARTMENT_OTHER): Admitting: Pulmonary Disease

## 2024-01-05 ENCOUNTER — Telehealth: Payer: Self-pay | Admitting: Internal Medicine

## 2024-01-05 NOTE — Telephone Encounter (Signed)
 Patient was transferred to the front desk and she stated she was told to make an appointment to talk with Dr. Cindie.  Patient stated she wonders why she needs an appointment when all he will do is talk to her and then order the test.  She thinks she has H Pylori again and just wants to get the test done.

## 2024-01-10 NOTE — Telephone Encounter (Signed)
 Per Dr Cindie the pt is to be scheduled to be seen to discuss other options for H Pylori plus she was suppose to follow up in Jan 2026. Pt will need a appt with Dr Cindie

## 2024-01-11 NOTE — Telephone Encounter (Signed)
 Noted.... will make an appointment for January with Dr. Cindie once his January schedule opens.

## 2024-01-24 ENCOUNTER — Ambulatory Visit: Admitting: Physician Assistant

## 2024-02-06 ENCOUNTER — Ambulatory Visit: Admitting: Internal Medicine

## 2024-02-21 ENCOUNTER — Telehealth (HOSPITAL_BASED_OUTPATIENT_CLINIC_OR_DEPARTMENT_OTHER): Payer: Self-pay

## 2024-02-21 NOTE — Telephone Encounter (Unsigned)
 Copied from CRM [id number]. Topic: Clinical - Medication Question >> Feb 21, 2024  9:35 AM [name] PARAS wrote: Reason for CRM:   Pt is contacting clinic regarding her prescribed Humira . She is wanting to know if she will be taking the medication for a short period of time or indefinitely for foreseeable future.  Requested call back  CB#  [phone]

## 2024-02-23 NOTE — Telephone Encounter (Signed)
 Discussed with patient regarding her current pulmonary care. 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 She has had Humira  discontinued and retrialing cellcept . Recently started but tolerating for now. Agree for patient to continue pulmonary care at [facility] and we are available as needed.

## 2024-02-24 ENCOUNTER — Other Ambulatory Visit: Payer: Self-pay | Admitting: Pulmonary Disease

## 2024-02-24 DIAGNOSIS — D869 Sarcoidosis, unspecified: Secondary | ICD-10-CM

## 2024-02-24 DIAGNOSIS — Z79899 Other long term (current) drug therapy: Secondary | ICD-10-CM
